# Patient Record
Sex: Male | Born: 1955 | Race: White | Hispanic: No | Marital: Married | Smoking: Current every day smoker
Health system: Southern US, Community
[De-identification: ages and names within clinical notes are randomized; demographics above are authoritative.]

## PROBLEM LIST (undated history)

## (undated) DIAGNOSIS — N189 Chronic kidney disease, unspecified: Secondary | ICD-10-CM

## (undated) DIAGNOSIS — I209 Angina pectoris, unspecified: Secondary | ICD-10-CM

## (undated) DIAGNOSIS — Z951 Presence of aortocoronary bypass graft: Secondary | ICD-10-CM

## (undated) DIAGNOSIS — E785 Hyperlipidemia, unspecified: Secondary | ICD-10-CM

## (undated) DIAGNOSIS — E119 Type 2 diabetes mellitus without complications: Secondary | ICD-10-CM

## (undated) DIAGNOSIS — J45909 Unspecified asthma, uncomplicated: Secondary | ICD-10-CM

## (undated) DIAGNOSIS — I219 Acute myocardial infarction, unspecified: Secondary | ICD-10-CM

## (undated) DIAGNOSIS — Z9889 Other specified postprocedural states: Secondary | ICD-10-CM

## (undated) DIAGNOSIS — I509 Heart failure, unspecified: Secondary | ICD-10-CM

## (undated) DIAGNOSIS — I2510 Atherosclerotic heart disease of native coronary artery without angina pectoris: Secondary | ICD-10-CM

## (undated) DIAGNOSIS — I10 Essential (primary) hypertension: Secondary | ICD-10-CM

## (undated) DIAGNOSIS — G709 Myoneural disorder, unspecified: Secondary | ICD-10-CM

## (undated) HISTORY — DX: Hyperlipidemia, unspecified: E78.5

## (undated) HISTORY — DX: Acute myocardial infarction, unspecified: I21.9

## (undated) HISTORY — PX: EYE SURGERY: SHX253

## (undated) HISTORY — DX: Heart failure, unspecified: I50.9

## (undated) HISTORY — DX: Type 2 diabetes mellitus without complications: E11.9

## (undated) HISTORY — DX: Essential (primary) hypertension: I10

## (undated) HISTORY — DX: Unspecified asthma, uncomplicated: J45.909

---

## 2000-11-05 HISTORY — PX: CAROTID ENDARTERECTOMY: SUR193

## 2000-11-05 HISTORY — PX: CORONARY ARTERY BYPASS GRAFT: SHX141

## 2001-04-20 ENCOUNTER — Inpatient Hospital Stay (HOSPITAL_COMMUNITY): Admission: AD | Admit: 2001-04-20 | Discharge: 2001-04-29 | Payer: Self-pay | Admitting: Cardiovascular Disease

## 2001-04-23 ENCOUNTER — Encounter: Payer: Self-pay | Admitting: Thoracic Surgery (Cardiothoracic Vascular Surgery)

## 2001-04-24 ENCOUNTER — Encounter: Payer: Self-pay | Admitting: Thoracic Surgery (Cardiothoracic Vascular Surgery)

## 2001-04-24 DIAGNOSIS — Z9889 Other specified postprocedural states: Secondary | ICD-10-CM

## 2001-04-24 DIAGNOSIS — Z951 Presence of aortocoronary bypass graft: Secondary | ICD-10-CM

## 2001-04-24 HISTORY — DX: Presence of aortocoronary bypass graft: Z95.1

## 2001-04-24 HISTORY — DX: Other specified postprocedural states: Z98.890

## 2001-04-25 ENCOUNTER — Encounter: Payer: Self-pay | Admitting: Thoracic Surgery (Cardiothoracic Vascular Surgery)

## 2001-04-25 ENCOUNTER — Encounter: Payer: Self-pay | Admitting: *Deleted

## 2001-04-26 ENCOUNTER — Encounter: Payer: Self-pay | Admitting: Thoracic Surgery (Cardiothoracic Vascular Surgery)

## 2001-04-27 ENCOUNTER — Encounter: Payer: Self-pay | Admitting: Thoracic Surgery (Cardiothoracic Vascular Surgery)

## 2012-09-12 ENCOUNTER — Encounter: Payer: Self-pay | Admitting: Physical Medicine and Rehabilitation

## 2012-10-01 ENCOUNTER — Encounter: Payer: Self-pay | Admitting: Physical Medicine and Rehabilitation

## 2012-10-01 ENCOUNTER — Encounter
Payer: Medicare Other | Attending: Physical Medicine and Rehabilitation | Admitting: Physical Medicine and Rehabilitation

## 2012-10-01 VITALS — BP 175/85 | HR 77 | Resp 14 | Ht 67.0 in | Wt 373.0 lb

## 2012-10-01 DIAGNOSIS — I739 Peripheral vascular disease, unspecified: Secondary | ICD-10-CM

## 2012-10-01 DIAGNOSIS — F17200 Nicotine dependence, unspecified, uncomplicated: Secondary | ICD-10-CM | POA: Insufficient documentation

## 2012-10-01 DIAGNOSIS — I252 Old myocardial infarction: Secondary | ICD-10-CM | POA: Insufficient documentation

## 2012-10-01 DIAGNOSIS — M545 Low back pain, unspecified: Secondary | ICD-10-CM

## 2012-10-01 DIAGNOSIS — G8929 Other chronic pain: Secondary | ICD-10-CM | POA: Insufficient documentation

## 2012-10-01 DIAGNOSIS — I10 Essential (primary) hypertension: Secondary | ICD-10-CM | POA: Insufficient documentation

## 2012-10-01 DIAGNOSIS — E119 Type 2 diabetes mellitus without complications: Secondary | ICD-10-CM | POA: Insufficient documentation

## 2012-10-01 DIAGNOSIS — M79609 Pain in unspecified limb: Secondary | ICD-10-CM

## 2012-10-01 DIAGNOSIS — E785 Hyperlipidemia, unspecified: Secondary | ICD-10-CM | POA: Insufficient documentation

## 2012-10-01 DIAGNOSIS — Z6841 Body Mass Index (BMI) 40.0 and over, adult: Secondary | ICD-10-CM | POA: Insufficient documentation

## 2012-10-01 DIAGNOSIS — Z5181 Encounter for therapeutic drug level monitoring: Secondary | ICD-10-CM

## 2012-10-01 DIAGNOSIS — M79604 Pain in right leg: Secondary | ICD-10-CM | POA: Insufficient documentation

## 2012-10-01 DIAGNOSIS — Z951 Presence of aortocoronary bypass graft: Secondary | ICD-10-CM | POA: Insufficient documentation

## 2012-10-01 MED ORDER — GABAPENTIN 400 MG PO CAPS: 300.0000 mg | ORAL_CAPSULE | Freq: Four times a day (QID) | ORAL | Status: DC

## 2012-10-01 NOTE — Patient Instructions (Signed)
Today we have discussed possible causes for your leg pain which is worse when you are up walking.  I have ordered some x-rays of your back to help me to evaluate this.  I am increasing your gabapentin from 300 mg 4 times a day to 400 mg 4 times a day.  I will see you back in 2-3 weeks.

## 2012-10-01 NOTE — Progress Notes (Signed)
Subjective:    Patient ID: Timothy Banks, male    DOB: 25-Nov-1955, 56 y.o.   MRN: [id number]  HPI  The patient is a 56 year old morbidly obese gentleman who has approximately 15 year history of diabetes. Underwent cardiac bypass surgery 2003.  Wife is in the room with the patient's permission.  Patient is referred for chronic pain complaints related to both feet and leg. Patient reports pain in feet and lower legs for approximately 2 years gradual onset. Denies trauma. Reports previous nerve conduction studies states he was told he had neuropathy. Studies are not available for review.  Patient has been tried on hydrocodone, oxycodone, gabapentin, Lyrica. He states none of them work very well.  He reports his average pain as an 8 or 9 on a scale of 10. On further questioning however he states that when he is sitting in his easy chair his pain is about a 2 or 3 on a scale of 10. His pain really only gets up to high levels when he is up walking or standing. In fact he states that he really can only walk a few minutes at a time before his back and his lower legs and feet hurt so much that he needs to sit down.  He spends most of his day sitting and watching television. He naps on and off throughout the day and night. He is very sedentary.  He is independent with self-care.  He drives very little. He's been disabled since approximately 2002 or 2003  Report some bladder problems, denies bowel control problems admit to numbness weakness tingling trouble walking  Denies suicidal ideation.  Other complaints include limb swelling, coughing, shortness of breath, wheezing  Patient admits to smoking half a pack per day cigarettes, advised against.  Past medical history is remarkable for diabetes coronary artery disease hypertension,  Surgical history heart surgery 2003, right carotid artery.  Pain Inventory Average Pain 8 Pain Right Now 9 My pain is burning and tingling  In the last 24 hours,  has pain interfered with the following? General activity 9 Relation with others 0 Enjoyment of life 9 What TIME of day is your pain at its worst? night Sleep (in general) Poor  Pain is worse with: walking and standing Pain improves with: medication Relief from Meds: 3  Mobility walk without assistance how many minutes can you walk? 2-3 ability to climb steps?  no do you drive?  yes  Function disabled: date disabled 2002-3 I need assistance with the following:  meal prep, household duties and shopping  Neuro/Psych bladder control problems weakness numbness tingling trouble walking  Prior Studies Any changes since last visit?  no  Physicians involved in your care Any changes since last visit?  no   Family History  Problem Relation Age of Onset  . Diabetes Mother   . Heart disease Mother   . Diabetes Father   . Heart disease Father    History   Social History  . Marital Status: Married    Spouse Name: N/A    Number of Children: N/A  . Years of Education: N/A   Social History Main Topics  . Smoking status: Current Every Day Smoker -- 1.0 packs/day    Types: Cigarettes    Start date: 11/05/1973  . Smokeless tobacco: Never Used     Comment: quit for 10 years, started back 9 mo ago  . Alcohol Use: None  . Drug Use: None  . Sexually Active: None   Other Topics  Concern  . None   Social History Narrative  . None   Past Surgical History  Procedure Date  . Endarterectomy 2002    right side  . Coronary artery bypass graft 2002   Past Medical History  Diagnosis Date  . Myocardial infarction   . Diabetes mellitus without complication   . CHF (congestive heart failure)   . Asthma   . Hyperlipidemia   . Hypertension    BP 175/85  Pulse 77  Resp 14  Ht 5\' 7"  (1.702 m)  Wt 373 lb (169.192 kg)  BMI 58.42 kg/m2  SpO2 93%    Review of Systems  Constitutional: Positive for diaphoresis and unexpected weight change.  Respiratory: Positive for cough,  shortness of breath and wheezing.   Cardiovascular: Positive for leg swelling.  Gastrointestinal: Positive for diarrhea.  Genitourinary:       Bladder control  Neurological: Positive for weakness and numbness.       Tingling  Hematological: Bruises/bleeds easily.  All other systems reviewed and are negative.       Objective:   Physical Exam  Morbidly obese gentleman who does not appear in any distress.  Oriented x3 each is clear affect is alert cooperative pleasant follows commands  Cranial nerves grossly intact  Coordination grossly intact  Focus on lower extremity exam.  Decreased sensation to pinprick light touch below knees  Motor strength good throughout both lower extremities except right EHL slightly weaker.  Reflexes are diminished and patellar tendons bilaterally Achilles tendons bilaterally  No abnormal tone clonus or tremors noted, toes downgoing  Transitions slowly sit to stand, gait is slightly wide based stable  Able to walk on heels and toes  Tandem gait with some difficulty  Romberg negative  Range of motion at hips knees and ankles within normal limits  Edema noted in both lower extremities.  Feet are warm without open areas difficult palpating pulses       Assessment & Plan:  1. 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 There may be an overlay of either lumbar spinal stenosis versus peripheral vascular disease. Will will review previous vascular workup and obtain lumbar radiographs.  Will increase  gabapentin from 300 mg 4 times a day to 400 mg 4 times a day.  UDS pending  Recommended assistive device, patient declines, states he is obtaining an electric scooter through other Dr.  Stann Mainland followup in 2-3 weeks after radiographs completed.   Addendum: Note for Dr. Ralene Cork  07/30/2012 note; "some vascular disease although not significant" "ABI's are normal and demonstrated a mildly abnormal wave forms and adequate perfusion for healing. Bilateral  anterior to demonstrate area of exclusive disease with diminished below and velocities on dorsalis pedis bilaterally being diminished. Peroneal arteries not visualized"

## 2012-10-01 NOTE — Addendum Note (Signed)
Addended by: Claiborne Rigg D on: 10/01/2012 01:33 PM   Modules accepted: Orders

## 2012-10-24 ENCOUNTER — Encounter
Payer: Medicare Other | Attending: Physical Medicine and Rehabilitation | Admitting: Physical Medicine and Rehabilitation

## 2012-10-24 DIAGNOSIS — E119 Type 2 diabetes mellitus without complications: Secondary | ICD-10-CM | POA: Insufficient documentation

## 2012-10-24 DIAGNOSIS — M79609 Pain in unspecified limb: Secondary | ICD-10-CM | POA: Insufficient documentation

## 2012-10-24 DIAGNOSIS — Z951 Presence of aortocoronary bypass graft: Secondary | ICD-10-CM | POA: Insufficient documentation

## 2012-10-24 DIAGNOSIS — G8929 Other chronic pain: Secondary | ICD-10-CM | POA: Insufficient documentation

## 2012-10-24 DIAGNOSIS — I10 Essential (primary) hypertension: Secondary | ICD-10-CM | POA: Insufficient documentation

## 2012-10-24 DIAGNOSIS — E785 Hyperlipidemia, unspecified: Secondary | ICD-10-CM | POA: Insufficient documentation

## 2012-10-24 DIAGNOSIS — Z6841 Body Mass Index (BMI) 40.0 and over, adult: Secondary | ICD-10-CM | POA: Insufficient documentation

## 2012-10-24 DIAGNOSIS — I252 Old myocardial infarction: Secondary | ICD-10-CM | POA: Insufficient documentation

## 2012-10-24 DIAGNOSIS — F17200 Nicotine dependence, unspecified, uncomplicated: Secondary | ICD-10-CM | POA: Insufficient documentation

## 2012-10-24 NOTE — Progress Notes (Unsigned)
No show for appt. 

## 2013-02-17 ENCOUNTER — Inpatient Hospital Stay (HOSPITAL_COMMUNITY): Payer: Medicare Other

## 2013-02-17 ENCOUNTER — Encounter (HOSPITAL_COMMUNITY): Payer: Self-pay | Admitting: General Practice

## 2013-02-17 ENCOUNTER — Inpatient Hospital Stay (HOSPITAL_COMMUNITY)
Admission: AD | Admit: 2013-02-17 | Discharge: 2013-02-23 | DRG: 682 | Disposition: A | Discharge status: Home / Self Care | Payer: Medicare Other | Source: Other Acute Inpatient Hospital | Attending: Family Medicine | Admitting: Family Medicine

## 2013-02-17 DIAGNOSIS — Z6841 Body Mass Index (BMI) 40.0 and over, adult: Secondary | ICD-10-CM

## 2013-02-17 DIAGNOSIS — N189 Chronic kidney disease, unspecified: Secondary | ICD-10-CM

## 2013-02-17 DIAGNOSIS — E662 Morbid (severe) obesity with alveolar hypoventilation: Secondary | ICD-10-CM | POA: Diagnosis present

## 2013-02-17 DIAGNOSIS — G4733 Obstructive sleep apnea (adult) (pediatric): Secondary | ICD-10-CM | POA: Diagnosis present

## 2013-02-17 DIAGNOSIS — N2581 Secondary hyperparathyroidism of renal origin: Secondary | ICD-10-CM | POA: Diagnosis present

## 2013-02-17 DIAGNOSIS — M545 Low back pain: Secondary | ICD-10-CM

## 2013-02-17 DIAGNOSIS — I120 Hypertensive chronic kidney disease with stage 5 chronic kidney disease or end stage renal disease: Secondary | ICD-10-CM | POA: Diagnosis present

## 2013-02-17 DIAGNOSIS — N170 Acute kidney failure with tubular necrosis: Principal | ICD-10-CM | POA: Diagnosis present

## 2013-02-17 DIAGNOSIS — E872 Acidosis, unspecified: Secondary | ICD-10-CM | POA: Diagnosis present

## 2013-02-17 DIAGNOSIS — F17200 Nicotine dependence, unspecified, uncomplicated: Secondary | ICD-10-CM | POA: Diagnosis present

## 2013-02-17 DIAGNOSIS — E118 Type 2 diabetes mellitus with unspecified complications: Secondary | ICD-10-CM

## 2013-02-17 DIAGNOSIS — E873 Alkalosis: Secondary | ICD-10-CM | POA: Diagnosis not present

## 2013-02-17 DIAGNOSIS — Z951 Presence of aortocoronary bypass graft: Secondary | ICD-10-CM

## 2013-02-17 DIAGNOSIS — E1129 Type 2 diabetes mellitus with other diabetic kidney complication: Secondary | ICD-10-CM | POA: Diagnosis present

## 2013-02-17 DIAGNOSIS — I509 Heart failure, unspecified: Secondary | ICD-10-CM | POA: Diagnosis present

## 2013-02-17 DIAGNOSIS — Z79899 Other long term (current) drug therapy: Secondary | ICD-10-CM

## 2013-02-17 DIAGNOSIS — R319 Hematuria, unspecified: Secondary | ICD-10-CM | POA: Diagnosis not present

## 2013-02-17 DIAGNOSIS — M79604 Pain in right leg: Secondary | ICD-10-CM

## 2013-02-17 DIAGNOSIS — J9600 Acute respiratory failure, unspecified whether with hypoxia or hypercapnia: Secondary | ICD-10-CM

## 2013-02-17 DIAGNOSIS — Z7902 Long term (current) use of antithrombotics/antiplatelets: Secondary | ICD-10-CM

## 2013-02-17 DIAGNOSIS — J9601 Acute respiratory failure with hypoxia: Secondary | ICD-10-CM | POA: Diagnosis present

## 2013-02-17 DIAGNOSIS — I2510 Atherosclerotic heart disease of native coronary artery without angina pectoris: Secondary | ICD-10-CM | POA: Diagnosis present

## 2013-02-17 DIAGNOSIS — N186 End stage renal disease: Secondary | ICD-10-CM | POA: Diagnosis present

## 2013-02-17 DIAGNOSIS — E785 Hyperlipidemia, unspecified: Secondary | ICD-10-CM | POA: Diagnosis present

## 2013-02-17 DIAGNOSIS — N179 Acute kidney failure, unspecified: Secondary | ICD-10-CM | POA: Diagnosis present

## 2013-02-17 DIAGNOSIS — J9620 Acute and chronic respiratory failure, unspecified whether with hypoxia or hypercapnia: Secondary | ICD-10-CM | POA: Diagnosis present

## 2013-02-17 DIAGNOSIS — E8770 Fluid overload, unspecified: Secondary | ICD-10-CM

## 2013-02-17 DIAGNOSIS — E8779 Other fluid overload: Secondary | ICD-10-CM | POA: Diagnosis present

## 2013-02-17 DIAGNOSIS — N039 Chronic nephritic syndrome with unspecified morphologic changes: Secondary | ICD-10-CM | POA: Diagnosis present

## 2013-02-17 DIAGNOSIS — E876 Hypokalemia: Secondary | ICD-10-CM | POA: Diagnosis not present

## 2013-02-17 DIAGNOSIS — D631 Anemia in chronic kidney disease: Secondary | ICD-10-CM | POA: Diagnosis present

## 2013-02-17 DIAGNOSIS — I252 Old myocardial infarction: Secondary | ICD-10-CM

## 2013-02-17 HISTORY — DX: Chronic kidney disease, unspecified: N18.9

## 2013-02-17 LAB — CBC WITH DIFFERENTIAL/PLATELET
Basophils Absolute: 0 10*3/uL (ref 0.0–0.1)
Basophils Relative: 0 % (ref 0–1)
Eosinophils Absolute: 0.4 10*3/uL (ref 0.0–0.7)
Eosinophils Relative: 3 % (ref 0–5)
HCT: 30.3 % — ABNORMAL LOW (ref 39.0–52.0)
MCH: 24.7 pg — ABNORMAL LOW (ref 26.0–34.0)
MCHC: 30.4 g/dL (ref 30.0–36.0)
MCV: 81.2 fL (ref 78.0–100.0)
Monocytes Absolute: 0.9 10*3/uL (ref 0.1–1.0)
Platelets: 202 10*3/uL (ref 150–400)
RDW: 18.1 % — ABNORMAL HIGH (ref 11.5–15.5)
WBC: 11.1 10*3/uL — ABNORMAL HIGH (ref 4.0–10.5)

## 2013-02-17 LAB — ABO/RH: ABO/RH(D): A POS

## 2013-02-17 LAB — GLUCOSE, CAPILLARY
Glucose-Capillary: 86 mg/dL (ref 70–99)
Glucose-Capillary: 89 mg/dL (ref 70–99)
Glucose-Capillary: 112 mg/dL — ABNORMAL HIGH (ref 70–99)
Glucose-Capillary: 130 mg/dL — ABNORMAL HIGH (ref 70–99)

## 2013-02-17 LAB — URINALYSIS, ROUTINE W REFLEX MICROSCOPIC
Glucose, UA: NEGATIVE mg/dL
Protein, ur: 100 mg/dL — AB
Specific Gravity, Urine: 1.012 (ref 1.005–1.030)
Urobilinogen, UA: 0.2 mg/dL (ref 0.0–1.0)

## 2013-02-17 LAB — COMPREHENSIVE METABOLIC PANEL
ALT: 11 U/L (ref 0–53)
AST: 11 U/L (ref 0–37)
CO2: 28 mEq/L (ref 19–32)
Calcium: 8.7 mg/dL (ref 8.4–10.5)
Chloride: 96 mEq/L (ref 96–112)
Creatinine, Ser: 4.37 mg/dL — ABNORMAL HIGH (ref 0.50–1.35)
GFR calc Af Amer: 16 mL/min — ABNORMAL LOW (ref >90)
GFR calc non Af Amer: 14 mL/min — ABNORMAL LOW (ref >90)
Glucose, Bld: 77 mg/dL (ref 70–99)
Sodium: 136 mEq/L (ref 135–145)
Total Bilirubin: 0.4 mg/dL (ref 0.3–1.2)

## 2013-02-17 LAB — URINE MICROSCOPIC-ADD ON

## 2013-02-17 LAB — TYPE AND SCREEN: ABO/RH(D): A POS

## 2013-02-17 LAB — MRSA PCR SCREENING: MRSA by PCR: POSITIVE — AB

## 2013-02-17 MED ORDER — CHLORTHALIDONE 50 MG PO TABS
500.0000 mg | ORAL_TABLET | Freq: Two times a day (BID) | ORAL | Status: DC
  Filled 2013-02-17: qty 10

## 2013-02-17 MED ORDER — SODIUM CHLORIDE 0.9 % IV SOLN
250.0000 mL | INTRAVENOUS | Status: DC | PRN
  Administered 2013-02-18: 250 mL via INTRAVENOUS

## 2013-02-17 MED ORDER — HYDRALAZINE HCL 50 MG PO TABS
50.0000 mg | ORAL_TABLET | Freq: Four times a day (QID) | ORAL | Status: DC
  Administered 2013-02-17 – 2013-02-23 (×23): 50 mg via ORAL
  Filled 2013-02-17 (×28): qty 1

## 2013-02-17 MED ORDER — INSULIN ASPART 100 UNIT/ML ~~LOC~~ SOLN
0.0000 [IU] | SUBCUTANEOUS | Status: DC
  Administered 2013-02-17 – 2013-02-18 (×2): 3 [IU] via SUBCUTANEOUS

## 2013-02-17 MED ORDER — ALBUTEROL SULFATE HFA 108 (90 BASE) MCG/ACT IN AERS: 2.0000 | INHALATION_SPRAY | Freq: Four times a day (QID) | RESPIRATORY_TRACT | Status: DC | PRN

## 2013-02-17 MED ORDER — FUROSEMIDE 10 MG/ML IJ SOLN
200.0000 mg | Freq: Four times a day (QID) | INTRAVENOUS | Status: DC
  Administered 2013-02-17: 200 mg via INTRAVENOUS
  Filled 2013-02-17 (×3): qty 20

## 2013-02-17 MED ORDER — CHLORHEXIDINE GLUCONATE CLOTH 2 % EX PADS
6.0000 | MEDICATED_PAD | Freq: Every day | CUTANEOUS | Status: AC
  Administered 2013-02-18 – 2013-02-22 (×5): 6 via TOPICAL

## 2013-02-17 MED ORDER — HEPARIN SODIUM (PORCINE) 5000 UNIT/ML IJ SOLN
5000.0000 [IU] | Freq: Three times a day (TID) | INTRAMUSCULAR | Status: DC
  Administered 2013-02-17 – 2013-02-19 (×8): 5000 [IU] via SUBCUTANEOUS
  Filled 2013-02-17 (×12): qty 1

## 2013-02-17 MED ORDER — KIDNEY FAILURE BOOK
Freq: Once | Status: AC
  Administered 2013-02-17: 1
  Filled 2013-02-17: qty 1

## 2013-02-17 MED ORDER — FUROSEMIDE 10 MG/ML IJ SOLN
200.0000 mg | Freq: Four times a day (QID) | INTRAVENOUS | Status: DC
  Administered 2013-02-18 – 2013-02-19 (×6): 200 mg via INTRAVENOUS
  Filled 2013-02-17 (×9): qty 20

## 2013-02-17 MED ORDER — SIMVASTATIN 20 MG PO TABS
20.0000 mg | ORAL_TABLET | Freq: Every evening | ORAL | Status: DC
  Administered 2013-02-17 – 2013-02-22 (×6): 20 mg via ORAL
  Filled 2013-02-17 (×7): qty 1

## 2013-02-17 MED ORDER — METOPROLOL TARTRATE 50 MG PO TABS
50.0000 mg | ORAL_TABLET | Freq: Two times a day (BID) | ORAL | Status: DC
  Administered 2013-02-17 – 2013-02-23 (×12): 50 mg via ORAL
  Filled 2013-02-17 (×14): qty 1

## 2013-02-17 MED ORDER — ISOSORBIDE MONONITRATE 10 MG PO TABS
10.0000 mg | ORAL_TABLET | Freq: Two times a day (BID) | ORAL | Status: DC
  Administered 2013-02-17 – 2013-02-23 (×12): 10 mg via ORAL
  Filled 2013-02-17 (×17): qty 1

## 2013-02-17 MED ORDER — CALCIUM ACETATE 667 MG PO CAPS
667.0000 mg | ORAL_CAPSULE | Freq: Three times a day (TID) | ORAL | Status: DC
  Administered 2013-02-17 – 2013-02-23 (×18): 667 mg via ORAL
  Filled 2013-02-17 (×20): qty 1

## 2013-02-17 MED ORDER — INSULIN GLARGINE 100 UNIT/ML ~~LOC~~ SOLN
15.0000 [IU] | Freq: Every day | SUBCUTANEOUS | Status: DC
  Administered 2013-02-17 – 2013-02-19 (×3): 15 [IU] via SUBCUTANEOUS
  Filled 2013-02-17 (×5): qty 0.15

## 2013-02-17 MED ORDER — HEPARIN SODIUM (PORCINE) 1000 UNIT/ML IJ SOLN
1.2000 mL | INTRAMUSCULAR | Status: DC | PRN
  Administered 2013-02-17: 2400 [IU] via INTRAVENOUS

## 2013-02-17 MED ORDER — ACETAMINOPHEN 325 MG PO TABS
650.0000 mg | ORAL_TABLET | Freq: Four times a day (QID) | ORAL | Status: DC | PRN
Start: 2013-02-17 — End: 2013-02-23
  Administered 2013-02-17 – 2013-02-22 (×3): 650 mg via ORAL
  Filled 2013-02-17 (×3): qty 2

## 2013-02-17 MED ORDER — CHLORTHALIDONE 50 MG PO TABS: 500.0000 mg | ORAL_TABLET | Freq: Two times a day (BID) | ORAL | Status: DC

## 2013-02-17 MED ORDER — HEPARIN SODIUM (PORCINE) 1000 UNIT/ML IJ SOLN
1.2000 mL | INTRAMUSCULAR | Status: DC | PRN
  Filled 2013-02-17: qty 2.4

## 2013-02-17 MED ORDER — CHLORTHALIDONE 50 MG PO TABS
200.0000 mg | ORAL_TABLET | Freq: Two times a day (BID) | ORAL | Status: AC
  Administered 2013-02-17 – 2013-02-18 (×2): 200 mg via ORAL
  Filled 2013-02-17 (×4): qty 4

## 2013-02-17 MED ORDER — MUPIROCIN 2 % EX OINT
1.0000 "application " | TOPICAL_OINTMENT | Freq: Two times a day (BID) | CUTANEOUS | Status: AC
  Administered 2013-02-17 – 2013-02-22 (×9): 1 via NASAL
  Filled 2013-02-17 (×2): qty 22

## 2013-02-17 NOTE — Procedures (Signed)
Hemodialysis Insertion Procedure Note Timothy Banks [id number] 1955-11-14  Procedure: Insertion of Hemodialysis Catheter Type: 3 port  Indications: Hemodialysis   Procedure Details Consent: Risks of procedure as well as the alternatives and risks of each were explained to the (patient/caregiver).  Consent for procedure obtained. Time Out: Verified patient identification, verified procedure, site/side was marked, verified correct patient position, special equipment/implants available, medications/allergies/relevent history reviewed, required imaging and test results available.  Performed  Maximum sterile technique was used including antiseptics, cap, gloves, gown, hand hygiene, mask and sheet. Skin prep: Chlorhexidine; local anesthetic administered A antimicrobial bonded/coated triple lumen catheter was placed in the right internal jugular vein using the Seldinger technique. Ultrasound guidance used.yes Catheter placed to 16 cm. Blood aspirated via all 3 ports and then flushed x 3. Line sutured x 2 and dressing applied.  Evaluation Blood flow good Complications: No apparent complications Patient did tolerate procedure well. Chest X-ray ordered to verify placement.  CXR: pending.  Brett Canales Minor ACNP [facility] PCCM Pager [phone] till 3 pm If no answer page [phone]  Ultrasound used for site verification, live visualisation of needle entry & guidewire prior to dilation Timothy Michel V.   02/17/2013, 12:22 PM

## 2013-02-17 NOTE — Progress Notes (Addendum)
RT attempted bipap with pt. As per order. Pt. Did not tolerate it. Pt. Ripped off the mask and stated he could not handle it. Pt. States he felt claustrophobic. RT placed pt. Back on nasal cannula 6L. RT will inform RN.

## 2013-02-17 NOTE — Progress Notes (Signed)
Placed pt. On venturi mask 50% due to pt. desating below 86 on 6L nasal cannula.

## 2013-02-17 NOTE — Consult Note (Signed)
I was asked by Dr. Vassie Loll to see Timothy Banks who is a 57 y.o. male transferred from [facility] with renal failure.  According to outside notes patient has known CKD stage 4 when admitted to [facility] in Feb 2014 with CHF and left AMA.  Pt admitted 4/11 to [facility] with c/o increasing swelling and SOB , felt to have decompensated CHF.  He has been relatively resistant to diuresis at [facility]. Prior to admission he ate whatever he wanted to eat.  Past Medical History  Diagnosis Date  . Myocardial infarction   . Diabetes mellitus without complication   . CHF (congestive heart failure)   . Asthma   . Hyperlipidemia   . Hypertension    Past Surgical History  Procedure Laterality Date  . Endarterectomy  2002    right side  . Coronary artery bypass graft  2002   Social History:  reports that he has been smoking Cigarettes.  He started smoking about 39 years ago. He has been smoking about 1.00 pack per day. He has never used smokeless tobacco. His alcohol and drug histories are not on file. Allergies: No Known Allergies Family History  Problem Relation Age of Onset  . Diabetes Mother   . Heart disease Mother   . Diabetes Father   . Heart disease Father     Medications:  Scheduled: . furosemide  200 mg Intravenous Q6H  . heparin  5,000 Units Subcutaneous Q8H  . hydrALAZINE  50 mg Oral Q6H  . insulin aspart  0-20 Units Subcutaneous Q4H  . insulin glargine  15 Units Subcutaneous QHS  . isosorbide mononitrate  10 mg Oral BID  . metoprolol  50 mg Oral BID  . simvastatin  20 mg Oral QPM    ROS: as per HPI  Blood pressure 148/54, pulse 62, temperature 97.5 F (36.4 C), temperature source Oral, resp. rate 19, height 5\' 7"  (1.702 m), weight 180.078 kg (397 lb), SpO2 96.00%.  General appearance: alert and cooperative Head: Normocephalic, without obvious abnormality, atraumatic Eyes: negative  Neck with right IJ catheter Nose: Nares normal. Septum midline. Mucosa normal. No drainage or sinus  tenderness. Throat: lips, mucosa, and tongue normal; teeth and gums normal GI: soft, non-tender; bowel sounds normal; no masses,  no organomegaly and mild ruq tenderness Extremities: edema 2-3+ edema Skin: Skin color, texture, turgor normal. No rashes or lesions Neurologic: Grossly normal Results for orders placed during the hospital encounter of 02/17/13 (from the past 48 hour(s))  GLUCOSE, CAPILLARY     Status: None   Collection Time    02/17/13 10:16 AM      Result Value Range   Glucose-Capillary 86  70 - 99 mg/dL  URINALYSIS, ROUTINE W REFLEX MICROSCOPIC     Status: Abnormal   Collection Time    02/17/13 10:22 AM      Result Value Range   Color, Urine YELLOW  YELLOW   APPearance CLOUDY (*) CLEAR   Specific Gravity, Urine 1.012  1.005 - 1.030   pH 5.0  5.0 - 8.0   Glucose, UA NEGATIVE  NEGATIVE mg/dL   Hgb urine dipstick LARGE (*) NEGATIVE   Bilirubin Urine NEGATIVE  NEGATIVE   Ketones, ur NEGATIVE  NEGATIVE mg/dL   Protein, ur 284 (*) NEGATIVE mg/dL   Urobilinogen, UA 0.2  0.0 - 1.0 mg/dL   Nitrite NEGATIVE  NEGATIVE   Leukocytes, UA MODERATE (*) NEGATIVE  URINE MICROSCOPIC-ADD ON     Status: None   Collection Time  02/17/13 10:22 AM      Result Value Range   Squamous Epithelial / LPF RARE  RARE   Comment: RARE   WBC, UA 21-50  <3 WBC/hpf   RBC / HPF 21-50  <3 RBC/hpf   Bacteria, UA RARE  RARE   Urine-Other RARE YEAST     Comment: LESS THAN 10 mL OF URINE SUBMITTED  MRSA PCR SCREENING     Status: Abnormal   Collection Time    02/17/13 10:23 AM      Result Value Range   MRSA by PCR POSITIVE (*) NEGATIVE   Comment:            The GeneXpert MRSA Assay (FDA     approved for NASAL specimens     only), is one component of a     comprehensive MRSA colonization     surveillance program. It is not     intended to diagnose MRSA     infection nor to guide or     monitor treatment for     MRSA infections.     RESULT CALLED TO, READ BACK BY AND VERIFIED WITH:     C.  MCKEOWN RN 12:40 02/17/13 ([email or web address])  COMPREHENSIVE METABOLIC PANEL     Status: Abnormal   Collection Time    02/17/13 11:30 AM      Result Value Range   Sodium 136  135 - 145 mEq/L   Potassium 4.9  3.5 - 5.1 mEq/L   Chloride 96  96 - 112 mEq/L   CO2 28  19 - 32 mEq/L   Glucose, Bld 77  70 - 99 mg/dL   BUN 84 (*) 6 - 23 mg/dL   Creatinine, Ser 1.61 (*) 0.50 - 1.35 mg/dL   Calcium 8.7  8.4 - 09.6 mg/dL   Total Protein 7.6  6.0 - 8.3 g/dL   Albumin 3.2 (*) 3.5 - 5.2 g/dL   AST 11  0 - 37 U/L   ALT 11  0 - 53 U/L   Alkaline Phosphatase 74  39 - 117 U/L   Total Bilirubin 0.4  0.3 - 1.2 mg/dL   GFR calc non Af Amer 14 (*) >90 mL/min   GFR calc Af Amer 16 (*) >90 mL/min   Comment:            The eGFR has been calculated     using the CKD EPI equation.     This calculation has not been     validated in all clinical     situations.     eGFR's persistently     <90 mL/min signify     possible Chronic Kidney Disease.  MAGNESIUM     Status: None   Collection Time    02/17/13 11:30 AM      Result Value Range   Magnesium 2.5  1.5 - 2.5 mg/dL  PHOSPHORUS     Status: Abnormal   Collection Time    02/17/13 11:30 AM      Result Value Range   Phosphorus 7.4 (*) 2.3 - 4.6 mg/dL  CBC WITH DIFFERENTIAL     Status: Abnormal   Collection Time    02/17/13 11:30 AM      Result Value Range   WBC 11.1 (*) 4.0 - 10.5 K/uL   RBC 3.73 (*) 4.22 - 5.81 MIL/uL   Hemoglobin 9.2 (*) 13.0 - 17.0 g/dL   HCT 04.5 (*) 40.9 - 81.1 %   MCV 81.2  78.0 -  100.0 fL   MCH 24.7 (*) 26.0 - 34.0 pg   MCHC 30.4  30.0 - 36.0 g/dL   RDW 81.1 (*) 91.4 - 78.2 %   Platelets 202  150 - 400 K/uL   Neutrophils Relative 82 (*) 43 - 77 %   Neutro Abs 9.1 (*) 1.7 - 7.7 K/uL   Lymphocytes Relative 7 (*) 12 - 46 %   Lymphs Abs 0.7  0.7 - 4.0 K/uL   Monocytes Relative 8  3 - 12 %   Monocytes Absolute 0.9  0.1 - 1.0 K/uL   Eosinophils Relative 3  0 - 5 %   Eosinophils Absolute 0.4  0.0 - 0.7 K/uL   Basophils Relative 0  0  - 1 %   Basophils Absolute 0.0  0.0 - 0.1 K/uL  TYPE AND SCREEN     Status: None   Collection Time    02/17/13  1:10 PM      Result Value Range   ABO/RH(D) A POS     Antibody Screen NEG     Sample Expiration 02/20/2013     Dg Chest Port 1 View  02/17/2013  *RADIOLOGY REPORT*  Clinical Data: New right internal jugular hemodialysis catheter  PORTABLE CHEST - 1 VIEW  Comparison: Portable exam 1259 hours compared to 02/17/2013  Findings: New right IJ catheter tip projecting over SVC at the level of the aortic arch. Enlargement of cardiac silhouette post CABG. Pulmonary vascular congestion. Perihilar infiltrate likely mild edema. No gross pleural effusion or pneumothorax.  IMPRESSION: No pneumothorax following right jugular line placement. Probable mild pulmonary edema.   Original Report Authenticated By: Ulyses Southward, M.D.     Assessment: 1 Volume overload in setting of advanced CKD 2 Diabetes mellitus with complications 3 Morbid obesity with complications  Plan: 1 Trial of high dose diuretic therapy (Furosemide 200 mg IV Q6) 2 Add IV Chlorthalidone 500mg  3 Add phosphate binder 4 Dialysis education 5 Anticipate dialysis in near future; protect right arm from needle sticks. He is left handed.  Aneesah Hernan C 02/17/2013, 3:33 PM

## 2013-02-17 NOTE — H&P (Signed)
PULMONARY  / CRITICAL CARE MEDICINE  Name: Timothy Banks MRN: [id number] DOB: 1956/05/11    ADMISSION DATE:  02/17/2013 CONSULTATION DATE:  02/17/2013  REFERRING MD :  Dr Ricki Miller  CHIEF COMPLAINT:  Difficulty breathing, transfer for acute renal failure  BRIEF PATIENT DESCRIPTION: 57 yo morbidly obese male transferred 4/15 from [place] for acute renal failure. Admitted 4/11 with worsening shortness of breath that has greatly limited his mobility.  He has multiple medical problems including DM, CHF, prior MI, , CAD, and CRF.   Baseline cr 1.5 in 2011, 3.1 on adm - diuresed with lasix gtt, on 4/14 diuresed 1.1 L with lasix 160 & zaroxlyn 10  ABg s/o resp acidosis on 4/15 - 7.31 /56  SIGNIFICANT EVENTS / STUDIES:    LINES / TUBES:   CULTURES:   ANTIBIOTICS:   HISTORY OF PRESENT ILLNESS:   57 yo morbidly obese male transferred 4/15 from  for acute renal failure. Admitted 4/11 with worsening shortness of breath that has greatly limited his mobility.  The patient states that he has had difficulty breathing for the last few years.  He went for walks in the past but has not been able to in the past few months.  The patient feels weak when he tries to perform any activities.  He recalls that about 6 weeks ago he fell trying to get off the toilet in his home.  The patient remembers the fall, and says he was unable to get up afterwards.  He has multiple medical problems including DM, CHF, prior MI, , CAD, and CRF.  The patient complains of swelling in his legs and pain around his ankles.  He reports occasional chest pain that radiates across his chest.  He denies dizziness, headache, diaphoresis, nausea, or vomiting.      PAST MEDICAL HISTORY :  Past Medical History  Diagnosis Date  . Myocardial infarction   . Diabetes mellitus without complication   . CHF (congestive heart failure)   . Asthma   . Hyperlipidemia   . Hypertension    Past Surgical History  Procedure  Laterality Date  . Endarterectomy  2002    right side  . Coronary artery bypass graft  2002   Prior to Admission medications   Medication Sig Start Date End Date Taking? Authorizing Provider  albuterol (PROVENTIL HFA;VENTOLIN HFA) 108 (90 BASE) MCG/ACT inhaler Inhale 2 puffs into the lungs every 6 (six) hours as needed.    Eugenia Pancoast  clopidogrel (PLAVIX) 75 MG tablet Take 75 mg by mouth daily.    Eugenia Pancoast  gabapentin (NEURONTIN) 400 MG capsule Take 1 capsule (400 mg total) by mouth 4 (four) times daily. 10/01/12   Ashok Cordia, MD  glipiZIDE (GLUCOTROL) 10 MG tablet Take 10 mg by mouth 2 (two) times daily before a meal.    Eugenia Pancoast  HYDROcodone-acetaminophen (NORCO) 10-325 MG per tablet Take 1 tablet by mouth every 6 (six) hours as needed.    Eugenia Pancoast  ISOSORBIDE MONONITRATE ER PO Take 30 mg by mouth daily.    Eugenia Pancoast  losartan (COZAAR) 100 MG tablet Take 100 mg by mouth daily.    Eugenia Pancoast  metFORMIN (GLUCOPHAGE) 1000 MG tablet Take 1,000 mg by mouth 2 (two) times daily with a meal.    Eugenia Pancoast  metoprolol (LOPRESSOR) 50 MG tablet Take 75 mg by mouth 2 (two) times daily. One and one-half tablets bid    Clancy C.  Laizure  nitroGLYCERIN (NITROSTAT) 0.4 MG SL tablet Place 0.4 mg under the tongue every 5 (five) minutes as needed.    Eugenia Pancoast  oxyCODONE-acetaminophen (PERCOCET) 10-325 MG per tablet Take 1 tablet by mouth every 8 (eight) hours as needed.    Eugenia Pancoast  simvastatin (ZOCOR) 20 MG tablet Take 20 mg by mouth every evening.    Eugenia Pancoast  torsemide (DEMADEX) 20 MG tablet Take 20 mg by mouth daily.    Eugenia Pancoast   No Known Allergies  FAMILY HISTORY:  Family History  Problem Relation Age of Onset  . Diabetes Mother   . Heart disease Mother   . Diabetes Father   . Heart disease Father    SOCIAL HISTORY:  reports that he has been smoking Cigarettes.  He started smoking about 39 years  ago. He has been smoking about 1.00 pack per day. He has never used smokeless tobacco. His alcohol and drug histories are not on file.  REVIEW OF SYSTEMS:  Gen: Denies fever, chills, weight change, fatigue, night sweats HEENT: Denies blurred vision, double vision, hearing loss, tinnitus, sinus congestion, rhinorrhea, sore throat, neck stiffness, dysphagia PULM: Positive shortness of breath, occasional cough; Negative sputum production; Negative hemoptysis, wheezing CV: Positive occasional  chest pain, edema, orthopnea, paroxysmal nocturnal dyspnea; Negative palpitations GI: Denies abdominal pain, nausea, vomiting, diarrhea, hematochezia, melena, constipation, change in bowel habits GU: Denies dysuria, hematuria, polyuria, oliguria, urethral discharge Endocrine: Denies hot or cold intolerance, polyuria, polyphagia or appetite change Derm: Denies rash, dry skin, scaling or peeling skin change Heme: Denies easy bruising, bleeding, bleeding gums Neuro: Positive weakness; Denies headache, numbness, slurred speech, loss of memory or consciousness  SUBJECTIVE:   VITAL SIGNS: Temp:  [97.5 F (36.4 C)] 97.5 F (36.4 C) (04/15 1020) Weight:  [397 lb (180.078 kg)] 397 lb (180.078 kg) (04/15 1020) HEMODYNAMICS:   VENTILATOR SETTINGS:   INTAKE / OUTPUT: Intake/Output   None     PHYSICAL EXAMINATION: General:  Well developed, alert and oriented, obese male in NAD ,normal affect ENT - no lesions, no post nasal drip Neck: No JVD, no thyromegaly, no carotid bruits Lungs: no use of accessory muscles, no dullness to percussion, decreased  without rales or rhonchi  Cardiovascular: Rhythm regular, heart sounds  normal, no murmurs, 1+ peripheral edema Abdomen: soft and non-tender, no hepatosplenomegaly, BS normal. Musculoskeletal: No deformities, no cyanosis or clubbing Neuro:  alert, non focal, no asterexis Skin:  Warm, no lesions/ rash   LABS: No results found for this basename: HGB, WBC, PLT,  NA, K, CL, CO2, GLUCOSE, BUN, CREATININE, CALCIUM, MG, PHOS, AST, ALT, ALKPHOS, BILITOT, PROT, ALBUMIN, APTT, INR, LATICACIDVEN, TROPONINI, PROCALCITON, PROBNP, O2SATVEN, PHART, PCO2ART, PO2ART,  in the last 168 hours  Recent Labs Lab 02/17/13 1016  GLUCAP 86    CXR: pending EKG- nSR, first degree HB, poor R wave progression  ASSESSMENT / PLAN:  PULMONARY A:Acute hypercarbic respiratory failure Resp acidosis with inadequate compensation on ABG  Likely has underlying OSA/ OHS P:   Bipap qhs & prn if mental status worsens - will need out pt sleep study & CPAP on discharge Monitor for altered mental status  CARDIOVASCULAR A: CHF -nml EF 55-60% CAD P: Avoid ACE or ARB  Use hydralazine & nitrates Lower dose of lopressor 50 bid - with hold parameters - due to first degree heart block Hold plavix for now - can restart in 24h if no procedures required   RENAL A:  ARF -  presume ATN now, non oliguric with lasix Inadequate compensation for metab aidosis P:   D/C Losartan and Metformin Monitor for possible potassium related EKG changes Renal US Renal consult- called Defer diuretics & bicarb to Renal   GASTROINTESTINAL A:  No issues P:   Renal diet  HEMATOLOGIC A:  Anemia of CKD P:  monitor  INFECTIOUS A:  No issues   ENDOCRINE A:  DM Type II   P:  SSI Give 1/2 dose - lantus 15 for now Hold metformin   NEUROLOGIC A:  No encephalopathy, no asterexis P:  Monitor mental status - bipap if worse  TODAY'S SUMMARY:  Transferred for acute renal failure - hope to avoid HD here, but will go ahead & place for access & CVP monitoring - can move to SDU if remains stable Defer diuretics & bicarb to Renal   Anna Genre, PA-S  I have personally obtained a history, examined the patient, evaluated laboratory and imaging results, formulated the assessment and plan and placed orders. CRITICAL CARE: The patient is critically ill with multiple organ systems failure and requires  high complexity decision making for assessment and support, frequent evaluation and titration of therapies, application of advanced monitoring technologies and extensive interpretation of multiple databases. Critical Care Time devoted to patient care services described in this note is 50 minutes.   Oretha Milch  Pulmonary and Critical Care Medicine [facility] Pager: [phone]  02/17/2013, 10:48 AM

## 2013-02-17 NOTE — Clinical Social Work Psychosocial (Signed)
     Clinical Social Work Department BRIEF PSYCHOSOCIAL ASSESSMENT 02/17/2013  Patient:  TARRY, FOUNTAIN     Account Number:  [id number]     Admit date:  02/17/2013  Clinical Social Worker:  Margaree Mackintosh  Date/Time:  02/17/2013 11:26 AM  Referred by:  RN  Date Referred:  02/17/2013 Referred for  Other - See comment   Other Referral:   Family is lost in hospital.   Interview type:  Family Other interview type:    PSYCHOSOCIAL DATA Living Status:  FAMILY Admitted from facility:   Level of care:   Primary support name:  Elis Sauber: [phone] Primary support relationship to patient:  SPOUSE Degree of support available:   Adequate.    CURRENT CONCERNS Current Concerns  Other - See comment   Other Concerns:   Family lost in hospital.    SOCIAL WORK ASSESSMENT / PLAN Clinical Social Worker received referral from RN indicating pt's son  is attempting to locate pt's wife wife, who is legally blind.  Both have become separated from each other in the hospital.  CSW located pt's son on second floor attempting to find pt's wife.  CSW walked with pt and notified security.  CSW and son located pt's wife, with pt's sister, in the ED.  Wife shared that she left her phone in son's car and was unable to call.  CSW notified security that all family members have been located.  CSW escorted family to pt's room, son retrieved phone from his car to assist with locating each other, should they become separated again.  CSW reviewed how to ask for help in the hospital-should they become lost again.  CSW encouraged family to check in with security, volunteer desk, or employees with badges shoudl they need assistance.  Family stated understanding.   Assessment/plan status:  Information/Referral to [facility] Other assessment/ plan:   Information/referral to community resources:   Hospital Waiting areas  [facility] Volunteer Desk    PATIENTS/FAMILYS RESPONSE  TO PLAN OF CARE: family appreciative of intervention.

## 2013-02-18 DIAGNOSIS — J9600 Acute respiratory failure, unspecified whether with hypoxia or hypercapnia: Secondary | ICD-10-CM

## 2013-02-18 DIAGNOSIS — M545 Low back pain, unspecified: Secondary | ICD-10-CM

## 2013-02-18 DIAGNOSIS — M79609 Pain in unspecified limb: Secondary | ICD-10-CM

## 2013-02-18 DIAGNOSIS — N179 Acute kidney failure, unspecified: Secondary | ICD-10-CM

## 2013-02-18 LAB — GLUCOSE, CAPILLARY
Glucose-Capillary: 118 mg/dL — ABNORMAL HIGH (ref 70–99)
Glucose-Capillary: 145 mg/dL — ABNORMAL HIGH (ref 70–99)
Glucose-Capillary: 157 mg/dL — ABNORMAL HIGH (ref 70–99)

## 2013-02-18 LAB — CBC
MCH: 25.4 pg — ABNORMAL LOW (ref 26.0–34.0)
MCHC: 31.3 g/dL (ref 30.0–36.0)
Platelets: 230 10*3/uL (ref 150–400)
RDW: 17.6 % — ABNORMAL HIGH (ref 11.5–15.5)

## 2013-02-18 LAB — BLOOD GAS, ARTERIAL
Acid-Base Excess: 4.9 mmol/L — ABNORMAL HIGH (ref 0.0–2.0)
Bicarbonate: 29.9 mEq/L — ABNORMAL HIGH (ref 20.0–24.0)
O2 Saturation: 95.3 %
Patient temperature: 97.4
TCO2: 31.5 mmol/L (ref 0–100)

## 2013-02-18 LAB — BASIC METABOLIC PANEL
Calcium: 8.6 mg/dL (ref 8.4–10.5)
GFR calc Af Amer: 16 mL/min — ABNORMAL LOW (ref >90)
GFR calc non Af Amer: 14 mL/min — ABNORMAL LOW (ref >90)
Potassium: 4.7 mEq/L (ref 3.5–5.1)
Sodium: 137 mEq/L (ref 135–145)

## 2013-02-18 MED ORDER — INSULIN ASPART 100 UNIT/ML ~~LOC~~ SOLN
0.0000 [IU] | Freq: Three times a day (TID) | SUBCUTANEOUS | Status: DC
  Administered 2013-02-18: 2 [IU] via SUBCUTANEOUS
  Administered 2013-02-18 – 2013-02-19 (×2): 3 [IU] via SUBCUTANEOUS
  Administered 2013-02-19: 2 [IU] via SUBCUTANEOUS
  Administered 2013-02-19 – 2013-02-20 (×2): 3 [IU] via SUBCUTANEOUS
  Administered 2013-02-20 (×2): 2 [IU] via SUBCUTANEOUS
  Administered 2013-02-21: 5 [IU] via SUBCUTANEOUS
  Administered 2013-02-21: 3 [IU] via SUBCUTANEOUS
  Administered 2013-02-22 (×2): 5 [IU] via SUBCUTANEOUS
  Administered 2013-02-22: 2 [IU] via SUBCUTANEOUS
  Administered 2013-02-23 (×2): 3 [IU] via SUBCUTANEOUS

## 2013-02-18 MED ORDER — DIPHENHYDRAMINE HCL 25 MG PO CAPS
25.0000 mg | ORAL_CAPSULE | Freq: Every evening | ORAL | Status: DC | PRN
  Administered 2013-02-18 – 2013-02-22 (×4): 25 mg via ORAL
  Filled 2013-02-18 (×4): qty 1

## 2013-02-18 NOTE — Care Management Note (Signed)
    Page 1 of 1   02/18/2013     2:23:00 PM   CARE MANAGEMENT NOTE 02/18/2013  Patient:  Timothy Banks, Timothy Banks   Account Number:  [id number]  Date Initiated:  02/18/2013  Documentation initiated by:  Avie Arenas  Subjective/Objective Assessment:   Lives with wife who is legally blind.  Tx from outside hospital for ?? need for dialysis.  At this time has not needed dialysis.     Action/Plan:   Anticipated DC Date:  02/25/2013   Anticipated DC Plan:  HOME W HOME HEALTH SERVICES      DC Planning Services  CM consult      Choice offered to / List presented to:             Status of service:  In process, will continue to follow Medicare Important Message given?   (If response is "NO", the following Medicare IM given date fields will be blank) Date Medicare IM given:   Date Additional Medicare IM given:    Discharge Disposition:    Per UR Regulation:  Reviewed for med. necessity/level of care/duration of stay  If discussed at Long Length of Stay Meetings, dates discussed:    Comments:  [place], [place] Spouse [phone]  02-18-13 2:15pm Avie Arenas, RNBSN [phone] Patient sitting up in room - now on [place].  Wife if room with patient - talked with both.  Live at home together with daughter and her 3 children ranging from 5 to less than 1. He drives, she assists him with bathing and ADL's.  He cooks from a seated position as he tires to easily standing.  Would like a w/c or hoover around for mobility. May need oxygen depending on progression.  At this time feel may not need dialysis.  CM will continue to follow for needs.  Feel will need HH RN and possibly PT on discharge. Physician will need to order.

## 2013-02-18 NOTE — Progress Notes (Addendum)
PULMONARY  / CRITICAL CARE MEDICINE  Name: Timothy Banks MRN: [id number] DOB: 06/09/1956    ADMISSION DATE:  02/17/2013 CONSULTATION DATE:  02/17/2013  REFERRING MD :  Dr Ricki Miller  CHIEF COMPLAINT:  Difficulty breathing, transfer for acute renal failure  BRIEF PATIENT DESCRIPTION: 57 yo morbidly obese male transferred 4/15 from [place] for acute renal failure. Admitted 4/11 with worsening shortness of breath that has greatly limited his mobility.  He has multiple medical problems including DM, CHF, prior MI, , CAD, and CRF.   Baseline cr 1.5 in 2011, 3.1 on adm - diuresed with lasix gtt, on 4/14 diuresed 1.1 L with lasix 160 & zaroxlyn 10  ABg s/o resp acidosis on 4/15 - 7.31 /56  SIGNIFICANT EVENTS / STUDIES:  4/16- distress, neg 3 liters  LINES / TUBES: 4/15 >>>Right IJ HD cath -   CULTURES:  ANTIBIOTICS: None  SUBJECTIVE: Still complaining of shortness of breath., distress  VITAL SIGNS: Temp:  [97.4 F (36.3 C)-98 F (36.7 C)] 97.4 F (36.3 C) (04/16 0758) Pulse Rate:  [57-69] 62 (04/16 0800) Resp:  [15-22] 20 (04/16 0800) BP: (98-148)/(31-93) 138/59 mmHg (04/16 0800) SpO2:  [84 %-97 %] 94 % (04/16 0800) FiO2 (%):  [50 %] 50 % (04/15 2224) Weight:  [397 lb (180.078 kg)] 397 lb (180.078 kg) (04/15 1020) HEMODYNAMICS:   VENTILATOR SETTINGS: Vent Mode:  [-]  FiO2 (%):  [50 %] 50 % INTAKE / OUTPUT: Intake/Output     04/15 0701 - 04/16 0700 04/16 0701 - 04/17 0700   P.O. 640    IV Piggyback 70    Total Intake(mL/kg) 710 (3.9)    Urine (mL/kg/hr) 3955 400 (1.3)   Total Output 3955 400   Net -3245 -400          PHYSICAL EXAMINATION: General:  Well developed, alert and oriented, obese male in NAD ,normal affect. Alert and in chair  ENT - no lesions, no post nasal drip Neck: No JVD, no thyromegaly, no carotid bruits Lungs: accessory muscles, coarse, crackles Cardiovascular: Rhythm regular, heart sounds  normal, no murmurs, 1+ peripheral edema Abdomen:  soft and non-tender, no hepatosplenomegaly, BS normal. Musculoskeletal: No deformities, no cyanosis or clubbing Neuro:  alert, non focal, no asterexis Skin:  Edema    LABS:  Recent Labs Lab 02/17/13 1130 02/18/13 0445  HGB 9.2* 9.2*  WBC 11.1* 11.4*  PLT 202 230  NA 136 137  K 4.9 4.7  CL 96 96  CO2 28 30  GLUCOSE 77 125*  BUN 84* 86*  CREATININE 4.37* 4.35*  CALCIUM 8.7 8.6  MG 2.5  --   PHOS 7.4*  --   AST 11  --   ALT 11  --   ALKPHOS 74  --   BILITOT 0.4  --   PROT 7.6  --   ALBUMIN 3.2*  --     Recent Labs Lab 02/17/13 1507 02/17/13 1912 02/17/13 2327 02/18/13 0352 02/18/13 0724  GLUCAP 112* 130* 115* 118* 124*    CXR: Probable mild pulmonary edema 4/15 - nSR, first degree HB, poor R wave progression  ASSESSMENT / PLAN:  PULMONARY A:Acute hypercarbic respiratory failure Resp acidosis with inadequate compensation on ABG  Likely has underlying OSA/ OHS pulm edema main contributor likely P:   Bipap need to schedule with distress, 4 hours on goal 4 hrs off x 24 hrs Continue neg balance pcxr in am  Monitor for altered mental status abg today May need HD Control afterload, low  threshold nitro drip  CARDIOVASCULAR A: CHF -nml EF 55-60% CAD P: Avoid ACE or ARB  Use hydralazine & nitrates Lower dose of lopressor 50 bid - with hold parameters - due to first degree heart block Hold plavix for now for possible procedures  RENAL A:  ARF - presume ATN now, non oliguric with lasix Inadequate compensation for metab acidosis. US renal - no hydronephrosis Good response to diuretics with 4L UOP. K level is stable P:   Hold  Losartan and Metformin High dose lasix 200mg  q6hrs and chrolthalidone 200mg  X2 doses. Possible HD, will d/w renal plan with distress, abg  Daily weights  GASTROINTESTINAL A:  No issues P:   Renal diet, may need  Npo, need abg  HEMATOLOGIC A:  Anemia of CKD P:  Monitor cbc on sub  q hep  INFECTIOUS A:  No issues Follow  temp curve  ENDOCRINE A:  DM Type II   P:  SSI Give 1/2 dose - lantus 15 for now Hold metformin  NEUROLOGIC A:  No encephalopathy, no asterexis P:  Monitor mental status - bipap if worse, abg  Signed:  Dow Adolph, MD PGY-1 Internal Medicine Teaching Service Pager: [phone] 02/18/2013, 8:40 AM   I have fully examined this patient and agree with above findings.    And edite dinfull   Ccm time 30 min   Mcarthur Rossetti. Tyson Alias, MD, FACP Pgr: [phone] [facility]

## 2013-02-18 NOTE — Progress Notes (Signed)
Spoke with pt about wearing BiPAP and he states that he "can not deal with that mask". Informed pt of benefits from wearing BiPAP but he states he would rather wear the Venturi mask that was placed on him last night. RN aware. Pt currently on 2L [place] with SpO2 of 94%. No distress noted.  RT will continue to monitor.

## 2013-02-18 NOTE — Progress Notes (Signed)
Pt watched Kidney Failure videos 1 and 2 upon arriving to unit.  Nurse will continue to enforce education while pt remains on unit.

## 2013-02-18 NOTE — Progress Notes (Signed)
Assessment:  1 Volume overload in setting of advanced CKD, responding to diuretics 2 Diabetes mellitus with complications  3 Morbid obesity with complications   Plan:  1Cont diuresis 2 Dialysis education   Anticipate dialysis in near future; protect right arm from needle sticks. He is left handed. 4 DC foley  Subjective: Interval History: Diuresed nicely, aprox 4 liters  Objective: Vital signs in last 24 hours: Temp:  [97.4 F (36.3 C)-98 F (36.7 C)] 97.4 F (36.3 C) (04/16 0758) Pulse Rate:  [57-69] 62 (04/16 0800) Resp:  [15-22] 20 (04/16 0800) BP: (98-148)/(31-93) 138/59 mmHg (04/16 0800) SpO2:  [84 %-97 %] 94 % (04/16 0800) FiO2 (%):  [50 %] 50 % (04/15 2224) Weight:  [180.078 kg (397 lb)] 180.078 kg (397 lb) (04/15 1020) Weight change:   Intake/Output from previous day: 04/15 0701 - 04/16 0700 In: 710 [P.O.:640; IV Piggyback:70] Out: 3955 [Urine:3955] Intake/Output this shift: Total I/O In: -  Out: 400 [Urine:400]  General appearance: alert and cooperative Head: Normocephalic, without obvious abnormality, atraumatic Chest wall: no tenderness Right neck IJ catheter Extremities: edema 2-3+  Lab Results:  Recent Labs  02/17/13 1130 02/18/13 0445  WBC 11.1* 11.4*  HGB 9.2* 9.2*  HCT 30.3* 29.4*  PLT 202 230   BMET:  Recent Labs  02/17/13 1130 02/18/13 0445  NA 136 137  K 4.9 4.7  CL 96 96  CO2 28 30  GLUCOSE 77 125*  BUN 84* 86*  CREATININE 4.37* 4.35*  CALCIUM 8.7 8.6   No results found for this basename: PTH,  in the last 72 hours Iron Studies: No results found for this basename: IRON, TIBC, TRANSFERRIN, FERRITIN,  in the last 72 hours Studies/Results: US Renal Port  02/17/2013  *RADIOLOGY REPORT*  Clinical Data: 57 year old male with acute renal failure.  RENAL/URINARY TRACT ULTRASOUND COMPLETE  Comparison:  12/08/2012. CT abdomen and pelvis 07/18/2011.  Findings:  Right Kidney:  No hydronephrosis.  Renal length approximately 10.0 cm.   Cortical echotexture at the upper limits of normal.  Left Kidney:  Difficult to visualize.  No hydronephrosis.  Stable renal cortical thickness.  Echotexture appears mildly increased.  Bladder:  Not visible.  IMPRESSION: No hydronephrosis.  Ventilation in the left kidney limited by large body habitus. Consider chronic medical renal disease.   Original Report Authenticated By: Erskine Speed, M.D.    Dg Chest Port 1 View  02/17/2013  *RADIOLOGY REPORT*  Clinical Data: New right internal jugular hemodialysis catheter  PORTABLE CHEST - 1 VIEW  Comparison: Portable exam 1259 hours compared to 02/17/2013  Findings: New right IJ catheter tip projecting over SVC at the level of the aortic arch. Enlargement of cardiac silhouette post CABG. Pulmonary vascular congestion. Perihilar infiltrate likely mild edema. No gross pleural effusion or pneumothorax.  IMPRESSION: No pneumothorax following right jugular line placement. Probable mild pulmonary edema.   Original Report Authenticated By: Ulyses Southward, M.D.     Scheduled: . calcium acetate  667 mg Oral TID WC  . Chlorhexidine Gluconate Cloth  6 each Topical Q0600  . chlorthalidone  200 mg Oral BID  . furosemide  200 mg Intravenous Q6H  . heparin  5,000 Units Subcutaneous Q8H  . hydrALAZINE  50 mg Oral Q6H  . insulin aspart  0-20 Units Subcutaneous Q4H  . insulin glargine  15 Units Subcutaneous QHS  . isosorbide mononitrate  10 mg Oral BID  . metoprolol  50 mg Oral BID  . mupirocin ointment  1 application Nasal BID  .  simvastatin  20 mg Oral QPM     LOS: 1 day   Brittay Mogle C 02/18/2013,8:21 AM

## 2013-02-19 ENCOUNTER — Inpatient Hospital Stay (HOSPITAL_COMMUNITY): Payer: Medicare Other

## 2013-02-19 DIAGNOSIS — E8770 Fluid overload, unspecified: Secondary | ICD-10-CM

## 2013-02-19 LAB — CBC
MCH: 25.1 pg — ABNORMAL LOW (ref 26.0–34.0)
MCHC: 31.7 g/dL (ref 30.0–36.0)
Platelets: 223 10*3/uL (ref 150–400)
RDW: 17.6 % — ABNORMAL HIGH (ref 11.5–15.5)

## 2013-02-19 LAB — GLUCOSE, CAPILLARY
Glucose-Capillary: 133 mg/dL — ABNORMAL HIGH (ref 70–99)
Glucose-Capillary: 132 mg/dL — ABNORMAL HIGH (ref 70–99)
Glucose-Capillary: 186 mg/dL — ABNORMAL HIGH (ref 70–99)

## 2013-02-19 MED ORDER — FUROSEMIDE 10 MG/ML IJ SOLN
100.0000 mg | Freq: Four times a day (QID) | INTRAVENOUS | Status: DC
  Administered 2013-02-19 – 2013-02-20 (×4): 100 mg via INTRAVENOUS
  Filled 2013-02-19 (×6): qty 10

## 2013-02-19 NOTE — Progress Notes (Signed)
Assessment:  1 Volume overload in setting of advanced CKD, responding nicely  to diuretics  2 Diabetes mellitus with complications  3 Morbid obesity with complications  Plan:  1Cont diuresis, reduce dosage  2 Dialysis education  Anticipate dialysis in near future; protect right arm from needle sticks. He is left handed.  4 DC foley   Subjective: Interval History: Diuresing.  Bloody drainage yesterday after foley removed.  He reports a similar episode after last foley removal  Objective: Vital signs in last 24 hours: Temp:  [97.5 F (36.4 C)-97.9 F (36.6 C)] 97.9 F (36.6 C) (04/17 0821) Pulse Rate:  [58-64] 58 (04/17 1000) Resp:  [15-23] 17 (04/17 1000) BP: (123-157)/(46-89) 147/49 mmHg (04/17 1000) SpO2:  [91 %-97 %] 97 % (04/17 0821) Weight:  [172.8 kg (380 lb 15.3 oz)] 172.8 kg (380 lb 15.3 oz) (04/17 0500) Weight change: -7.278 kg (-16 lb 0.7 oz)  Intake/Output from previous day: 04/16 0701 - 04/17 0700 In: 240 [P.O.:240] Out: 6725 [Urine:6725] Intake/Output this shift:    General appearance: alert and cooperative GI: soft, non-tender; bowel sounds normal; no masses,  no organomegaly and protuberant Extremities: edema 3+ Presacral edema present Obese  Lab Results:  Recent Labs  02/18/13 0445 02/19/13 0912  WBC 11.4* 8.7  HGB 9.2* 9.1*  HCT 29.4* 28.7*  PLT 230 223   BMET:  Recent Labs  02/17/13 1130 02/18/13 0445  NA 136 137  K 4.9 4.7  CL 96 96  CO2 28 30  GLUCOSE 77 125*  BUN 84* 86*  CREATININE 4.37* 4.35*  CALCIUM 8.7 8.6   No results found for this basename: PTH,  in the last 72 hours Iron Studies: No results found for this basename: IRON, TIBC, TRANSFERRIN, FERRITIN,  in the last 72 hours Studies/Results: US Renal Port  02/17/2013  *RADIOLOGY REPORT*  Clinical Data: 57 year old male with acute renal failure.  RENAL/URINARY TRACT ULTRASOUND COMPLETE  Comparison:  12/08/2012. CT abdomen and pelvis 07/18/2011.  Findings:  Right Kidney:  No  hydronephrosis.  Renal length approximately 10.0 cm.  Cortical echotexture at the upper limits of normal.  Left Kidney:  Difficult to visualize.  No hydronephrosis.  Stable renal cortical thickness.  Echotexture appears mildly increased.  Bladder:  Not visible.  IMPRESSION: No hydronephrosis.  Ventilation in the left kidney limited by large body habitus. Consider chronic medical renal disease.   Original Report Authenticated By: Erskine Speed, M.D.    Dg Chest Port 1 View  02/19/2013  *RADIOLOGY REPORT*  Clinical Data:  Pulmonary edema  PORTABLE CHEST - 1 VIEW  Comparison: Portable exam 0608 hours compared to 02/17/2013  Findings: Right jugular central venous catheter with tip projecting over SVC. Enlargement of cardiac silhouette post CABG. Pulmonary vascular congestion. Decreased lung volumes. Accentuation of perihilar markings likely represents mild pulmonary edema. No segmental consolidation, pleural effusion, or pneumothorax.  IMPRESSION: Probable mild CHF.   Original Report Authenticated By: Ulyses Southward, M.D.    Dg Chest Port 1 View  02/17/2013  *RADIOLOGY REPORT*  Clinical Data: New right internal jugular hemodialysis catheter  PORTABLE CHEST - 1 VIEW  Comparison: Portable exam 1259 hours compared to 02/17/2013  Findings: New right IJ catheter tip projecting over SVC at the level of the aortic arch. Enlargement of cardiac silhouette post CABG. Pulmonary vascular congestion. Perihilar infiltrate likely mild edema. No gross pleural effusion or pneumothorax.  IMPRESSION: No pneumothorax following right jugular line placement. Probable mild pulmonary edema.   Original Report Authenticated By: Ulyses Southward,  M.D.    Scheduled: . calcium acetate  667 mg Oral TID WC  . Chlorhexidine Gluconate Cloth  6 each Topical Q0600  . furosemide  200 mg Intravenous Q6H  . heparin  5,000 Units Subcutaneous Q8H  . hydrALAZINE  50 mg Oral Q6H  . insulin aspart  0-15 Units Subcutaneous TID WC  . insulin glargine  15 Units  Subcutaneous QHS  . isosorbide mononitrate  10 mg Oral BID  . metoprolol  50 mg Oral BID  . mupirocin ointment  1 application Nasal BID  . simvastatin  20 mg Oral QPM    LOS: 2 days   Giorgia Wahler C 02/19/2013,10:28 AM

## 2013-02-19 NOTE — Progress Notes (Signed)
PT stated that he can not wear the BIPAP he says it chokes him

## 2013-02-19 NOTE — Progress Notes (Signed)
PULMONARY  / CRITICAL CARE MEDICINE  Name: Timothy Banks MRN: [id number] DOB: [birth year]    ADMISSION DATE:  02/17/2013 CONSULTATION DATE:  02/17/2013  REFERRING MD :  Dr Ricki Miller  CHIEF COMPLAINT:  Difficulty breathing, transfer for acute renal failure  BRIEF PATIENT DESCRIPTION: 57 yo morbidly obese male transferred 4/15 from [place] for acute renal failure. Admitted 4/11 with worsening shortness of breath that has greatly limited his mobility.  He has multiple medical problems including DM, CHF, prior MI, , CAD, and CRF.   Baseline cr 1.5 in 2011, 3.1 on adm - diuresed with lasix gtt, on 4/14 diuresed 1.1 L with lasix 160 & zaroxlyn 10  ABg s/o resp acidosis on 4/15 - 7.31 /56  SIGNIFICANT EVENTS / STUDIES:  4/16- distress, neg 3 liters 4/17 Episode of severe hematuria shortly after removal of   LINES / TUBES: Right IJ HD cath 4/15 >>  CULTURES: MRSA PCR 4/15 >> POS  ANTIBIOTICS: None  SUBJECTIVE: No new complaints. Feels ready to go home. No distress  VITAL SIGNS: Temp:  [97.5 F (36.4 C)-97.9 F (36.6 C)] 97.9 F (36.6 C) (04/17 0821) Pulse Rate:  [58-64] 62 (04/17 1157) Resp:  [15-23] 21 (04/17 1157) BP: (123-157)/(45-89) 156/55 mmHg (04/17 1158) SpO2:  [91 %-97 %] 97 % (04/17 1157) Weight:  [172.8 kg (380 lb 15.3 oz)] 172.8 kg (380 lb 15.3 oz) (04/17 0500) HEMODYNAMICS:   VENTILATOR SETTINGS:   INTAKE / OUTPUT: Intake/Output     04/16 0701 - 04/17 0700 04/17 0701 - 04/18 0700   P.O. 240    IV Piggyback     Total Intake(mL/kg) 240 (1.4)    Urine (mL/kg/hr) 6725 (1.6) 800 (0.6)   Total Output 6725 800   Net -6485 -800        Urine Occurrence 1 x 1 x     PHYSICAL EXAMINATION: General: NAD  HEENT: WNL Neck: R IJ HD cath site clean Lungs: no wheezes.  Distant BS Cardiovascular: RRR s M Abdomen: obese, soft, NT, NABS Ext: 2-3+ LE edema, symmetric Neuro:  No focal deficits    LABS:  Recent Labs Lab 02/17/13 1130 02/18/13 0445  02/18/13 1045 02/19/13 0912  HGB 9.2* 9.2*  --  9.1*  WBC 11.1* 11.4*  --  8.7  PLT 202 230  --  223  NA 136 137  --   --   K 4.9 4.7  --   --   CL 96 96  --   --   CO2 28 30  --   --   GLUCOSE 77 125*  --   --   BUN 84* 86*  --   --   CREATININE 4.37* 4.35*  --   --   CALCIUM 8.7 8.6  --   --   MG 2.5  --   --   --   PHOS 7.4*  --   --   --   AST 11  --   --   --   ALT 11  --   --   --   ALKPHOS 74  --   --   --   BILITOT 0.4  --   --   --   PROT 7.6  --   --   --   ALBUMIN 3.2*  --   --   --   PHART  --   --  7.382  --   PCO2ART  --   --  51.0*  --  PO2ART  --   --  73.6*  --     Recent Labs Lab 02/18/13 1532 02/18/13 1625 02/18/13 2139 02/19/13 0905 02/19/13 1201  GLUCAP 186* 145* 157* 133* 132*    CXR: CM, interstitial prominence - probably improved   ASSESSMENT / PLAN:  PULMONARY A: Chronic hypercarbic respiratory failure - likely OHS Acute resp acidosis, resolved  pulm edema - -improved P:   Cont to wean supplemental O2 as tolerated Keep SpO2 90-94% Cont mandatory nocturnal BiPAP for presumed OSA   CARDIOVASCULAR A: CHF -nml EF 55-60% H/O CAD P:  Cont current Rx  RENAL A:  ARF - presume ATN now, non oliguric with lasix Severe hypervolemia Hematuria X 1 4/17 - likely related to foley cath P:   Hold  Losartan and Metformin Lasix dose adjusted by Renal. Decisions re: HD per Renal  If further hematuria, consider Urology eval   GASTROINTESTINAL A:  Obesity P:   Cont Renal diet  HEMATOLOGIC A:  Anemia of CKD P:  Monitor cbc on sub  q hep   INFECTIOUS A:  No issues Follow temp curve  ENDOCRINE A:  DM Type II   P:  Cont SSI and Lantus Hold metformin due to renal failure   NEUROLOGIC A:  No acute issues    Transfer to Renal floor with Tele TRH to assume care as of 4/18 AM and PCCM to sign off. Please call if we can be of further assistance   Billy Fischer, MD ; [facility] [phone].  After 5:30 PM or  weekends, call [phone]

## 2013-02-19 NOTE — Progress Notes (Addendum)
0930:  Reassessment of pt; pt is comfortable and is no longer bleeding from penis at this time.  Nurse will continue to montior.  9604:  MD Byrum returned nurse page, acknowledged concerns.  Instructed nurse to obtain STAT CBC.  Nurse will carry out orders and instructed and will continue to monitor pt.  0900:  Pt is asymptomatic, Vitals post epidsode are documented in epic.  Upon reassessment it appears that blood had stopped draining from penis.  Nurse contacted CCM again for advisement regarding current situation, nurse will await instruction.  0830: Upon am assessment nurse was informed by pt that his urine was bloody, reported that the blood had occurred most of the night.  Pt ambulated to bathroom, had bowel movement, comode was full of massive amounts of blood coming from pt penis.  Nurse instructed pt to remain on toilet to determine actual source of bleeding.  Nurse paged CCM.  Upon examination it was determined that the blood was constantly oozing out of his penis.  Nurse was able to get pt back to chair while blood continued to ooze out of penis.  Nurse pushed E link button and requested on call MD contact nurse.

## 2013-02-19 NOTE — Progress Notes (Signed)
Clinical Social Worker staffed case with [employer].  CSW to sign off at this time, please re consult if needed.     Angelia Mould, MSW, [place] [phone]

## 2013-02-20 ENCOUNTER — Encounter (HOSPITAL_COMMUNITY): Payer: Self-pay | Admitting: Thoracic Diseases

## 2013-02-20 DIAGNOSIS — I2510 Atherosclerotic heart disease of native coronary artery without angina pectoris: Secondary | ICD-10-CM

## 2013-02-20 DIAGNOSIS — I6529 Occlusion and stenosis of unspecified carotid artery: Secondary | ICD-10-CM

## 2013-02-20 DIAGNOSIS — N186 End stage renal disease: Secondary | ICD-10-CM

## 2013-02-20 LAB — COMPREHENSIVE METABOLIC PANEL
AST: 21 U/L (ref 0–37)
Albumin: 3.2 g/dL — ABNORMAL LOW (ref 3.5–5.2)
Alkaline Phosphatase: 72 U/L (ref 39–117)
Chloride: 92 mEq/L — ABNORMAL LOW (ref 96–112)
Creatinine, Ser: 3.99 mg/dL — ABNORMAL HIGH (ref 0.50–1.35)
Potassium: 3.8 mEq/L (ref 3.5–5.1)
Total Bilirubin: 0.4 mg/dL (ref 0.3–1.2)
Total Protein: 7.6 g/dL (ref 6.0–8.3)

## 2013-02-20 LAB — GLUCOSE, CAPILLARY
Glucose-Capillary: 121 mg/dL — ABNORMAL HIGH (ref 70–99)
Glucose-Capillary: 148 mg/dL — ABNORMAL HIGH (ref 70–99)
Glucose-Capillary: 158 mg/dL — ABNORMAL HIGH (ref 70–99)
Glucose-Capillary: 259 mg/dL — ABNORMAL HIGH (ref 70–99)

## 2013-02-20 LAB — CBC
HCT: 27.3 % — ABNORMAL LOW (ref 39.0–52.0)
MCH: 25.4 pg — ABNORMAL LOW (ref 26.0–34.0)
MCV: 79.8 fL (ref 78.0–100.0)
Platelets: 240 10*3/uL (ref 150–400)
RBC: 3.42 MIL/uL — ABNORMAL LOW (ref 4.22–5.81)

## 2013-02-20 MED ORDER — FUROSEMIDE 10 MG/ML IJ SOLN
100.0000 mg | Freq: Three times a day (TID) | INTRAVENOUS | Status: DC
  Administered 2013-02-20 (×2): 100 mg via INTRAVENOUS
  Filled 2013-02-20 (×5): qty 10

## 2013-02-20 MED ORDER — ASPIRIN EC 325 MG PO TBEC
325.0000 mg | DELAYED_RELEASE_TABLET | Freq: Every day | ORAL | Status: DC
  Filled 2013-02-20: qty 1

## 2013-02-20 MED ORDER — INSULIN GLARGINE 100 UNIT/ML ~~LOC~~ SOLN
24.0000 [IU] | Freq: Every day | SUBCUTANEOUS | Status: DC
  Administered 2013-02-20 – 2013-02-22 (×3): 24 [IU] via SUBCUTANEOUS
  Filled 2013-02-20 (×4): qty 0.24

## 2013-02-20 MED ORDER — FUROSEMIDE 10 MG/ML IJ SOLN
100.0000 mg | Freq: Three times a day (TID) | INTRAVENOUS | Status: DC
  Administered 2013-02-21 (×2): 100 mg via INTRAVENOUS
  Filled 2013-02-20 (×3): qty 10

## 2013-02-20 MED ORDER — GLIPIZIDE 10 MG PO TABS
10.0000 mg | ORAL_TABLET | Freq: Two times a day (BID) | ORAL | Status: DC
  Filled 2013-02-20 (×2): qty 1

## 2013-02-20 NOTE — Progress Notes (Signed)
Assessment:  1 Volume overload in setting of advanced CKD, responding nicely to diuretics  2 Diabetes mellitus with complications  3 Morbid obesity with complications  Plan:  1Cont diuresis, reduce dosage to tid  2 Dialysis education  Anticipate dialysis in near future; protect right arm from needle sticks. He is left handed.  3 Vein Mapping 4 Ask VVS to see   Subjective: Interval History: Blood per urethra   Objective: Vital signs in last 24 hours: Temp:  [97.1 F (36.2 C)-98.4 F (36.9 C)] 97.1 F (36.2 C) (04/18 0805) Pulse Rate:  [56-64] 58 (04/18 0805) Resp:  [16-22] 18 (04/18 0805) BP: (113-157)/(27-55) 113/27 mmHg (04/18 0805) SpO2:  [90 %-98 %] 95 % (04/18 0805) Weight change:   Intake/Output from previous day: 04/17 0701 - 04/18 0700 In: 400 [P.O.:220; IV Piggyback:180] Out: 2275 [Urine:2275] Intake/Output this shift:    General appearance: alert and cooperative Back: presacral edema GI: soft, non-tender; bowel sounds normal; no masses,  no organomegaly and protuberant Extremities: edema 2-3+  Lab Results:  Recent Labs  02/19/13 0912 02/20/13 0400  WBC 8.7 10.9*  HGB 9.1* 8.7*  HCT 28.7* 27.3*  PLT 223 240   BMET:  Recent Labs  02/18/13 0445 02/20/13 0400  NA 137 136  K 4.7 3.8  CL 96 92*  CO2 30 34*  GLUCOSE 125* 136*  BUN 86* 91*  CREATININE 4.35* 3.99*  CALCIUM 8.6 8.7   No results found for this basename: PTH,  in the last 72 hours Iron Studies: No results found for this basename: IRON, TIBC, TRANSFERRIN, FERRITIN,  in the last 72 hours Studies/Results: Dg Chest Port 1 View  02/19/2013  *RADIOLOGY REPORT*  Clinical Data:  Pulmonary edema  PORTABLE CHEST - 1 VIEW  Comparison: Portable exam 0608 hours compared to 02/17/2013  Findings: Right jugular central venous catheter with tip projecting over SVC. Enlargement of cardiac silhouette post CABG. Pulmonary vascular congestion. Decreased lung volumes. Accentuation of perihilar markings  likely represents mild pulmonary edema. No segmental consolidation, pleural effusion, or pneumothorax.  IMPRESSION: Probable mild CHF.   Original Report Authenticated By: Ulyses Southward, M.D.    Scheduled: . calcium acetate  667 mg Oral TID WC  . Chlorhexidine Gluconate Cloth  6 each Topical Q0600  . furosemide  100 mg Intravenous Q6H  . heparin  5,000 Units Subcutaneous Q8H  . hydrALAZINE  50 mg Oral Q6H  . insulin aspart  0-15 Units Subcutaneous TID WC  . insulin glargine  15 Units Subcutaneous QHS  . isosorbide mononitrate  10 mg Oral BID  . metoprolol  50 mg Oral BID  . mupirocin ointment  1 application Nasal BID  . simvastatin  20 mg Oral QPM    LOS: 3 days   Trina Asch C 02/20/2013,8:13 AM

## 2013-02-20 NOTE — Progress Notes (Addendum)
Patient had moderate amount of hematuria.  Urine has been clear in urinal tonight.  Per patient it is bloody when he sits on the toilet but clear when he stands and uses the urinal.  Patient still has blood trickling out from penis. Reminded patient that we need to measure all urine. Lenny Pastel, NP notified; CBC ordered. Steele Berg RN

## 2013-02-20 NOTE — Progress Notes (Signed)
[facility] Progress Note [place] TEAM 1 - Stepdown/ICU TEAM   GEOFREY SILLIMAN ZOX:[id number] DOB: May 30, 1956 DOA: 02/17/2013 PCP: Dr Ricki Miller   Brief narrative: 57 yo morbidly obese male transferred 4/15 from [facility] for acute renal failure. Admitted 4/11 with worsening shortness of breath that greatly limited his mobility. He has multiple chronic medical problems including DM, CHF, prior MI, , CAD, and CRF.  Baseline cr 1.5 in 2011, 3.1 on adm - diuresed with lasix gtt.   Assessment/Plan:  Acute renal failure presumed ATN - non oliguric with lasix - baseline crt felt to be 1.5 - appears to be progressing to long term HD - Nephrology following with plans for VVS to place permanent access  Chronic hypercarbic respiratory failure likely OHS - well compensated at this time   Acute resp acidosis resolved   Severe hypervolemia with pulm edema Improved - due to acute renal failure - net negative 12L fluid balance at this time   Hematuria X 1 4/17 - again 4/18 related to foley cath - is recurring - stop heparin and ASA and follow - pt reports having this problem in the past related to use of foley cath  Obesity  DM2 CBG well controlled at this time   CAD s/p CABG 2002 Asymptomatic at this time - having to hold ASA currently due to signif hematuria - resume asap  Code Status: FULL Family Communication: Spoke with patient and wife at bedside Disposition Plan: tele bed   Consultants: Nephrology  Procedures: Right IJ HD cath 4/15 >>  Antibiotics: none  DVT prophylaxis: SCDs only (stopping SQ heparin due to gross recurring hematuria)  HPI/Subjective: The patient is resting comfortably in bed.  He reports another episode of significant gross hematuria this morning associated with passing of some clots.  He states he had a similar episode approximately one year ago associated with placement of a Foley catheter.  He denies chest pain shortness of breath nausea  or vomiting.  He reports that he's feeling much better than he was when he initially presented to the hospital.  Objective: Blood pressure 142/63, pulse 66, temperature 98.6 F (37 C), temperature source Oral, resp. rate 18, height 5\' 7"  (1.702 m), weight 172.8 kg (380 lb 15.3 oz), SpO2 95.00%.  Intake/Output Summary (Last 24 hours) at 02/20/13 1234 Last data filed at 02/20/13 0900  Gross per 24 hour  Intake    400 ml  Output   1775 ml  Net  -1375 ml   Exam: General: No acute respiratory distress Lungs: Very distant breath sounds related to body habitus with no focal crackles and no wheezes appreciable Cardiovascular: Distant heart sounds without appreciable gallop or rub Abdomen: Morbidly obese , nontender, nondistended, soft, bowel sounds positive, no rebound, no ascites, no appreciable mass Extremities: 2+ bilateral lower extremity edema without cyanosis  Data Reviewed: Basic Metabolic Panel:  Recent Labs Lab 02/17/13 1130 02/18/13 0445 02/20/13 0400  NA 136 137 136  K 4.9 4.7 3.8  CL 96 96 92*  CO2 28 30 34*  GLUCOSE 77 125* 136*  BUN 84* 86* 91*  CREATININE 4.37* 4.35* 3.99*  CALCIUM 8.7 8.6 8.7  MG 2.5  --   --   PHOS 7.4*  --   --    Liver Function Tests:  Recent Labs Lab 02/17/13 1130 02/20/13 0400  AST 11 21  ALT 11 18  ALKPHOS 74 72  BILITOT 0.4 0.4  PROT 7.6 7.6  ALBUMIN 3.2* 3.2*   CBC:  Recent Labs Lab 02/17/13 1130 02/18/13 0445 02/19/13 0912 02/20/13 0400  WBC 11.1* 11.4* 8.7 10.9*  NEUTROABS 9.1*  --   --   --   HGB 9.2* 9.2* 9.1* 8.7*  HCT 30.3* 29.4* 28.7* 27.3*  MCV 81.2 81.2 79.3 79.8  PLT 202 230 223 240   CBG:  Recent Labs Lab 02/19/13 2036 02/20/13 0351 02/20/13 0806 02/20/13 1036 02/20/13 1152  GLUCAP 150* 119* 121* 143* 148*    Recent Results (from the past 240 hour(s))  MRSA PCR SCREENING     Status: Abnormal   Collection Time    02/17/13 10:23 AM      Result Value Range Status   MRSA by PCR POSITIVE (*)  NEGATIVE Final   Comment:            The GeneXpert MRSA Assay (FDA     approved for NASAL specimens     only), is one component of a     comprehensive MRSA colonization     surveillance program. It is not     intended to diagnose MRSA     infection nor to guide or     monitor treatment for     MRSA infections.     RESULT CALLED TO, READ BACK BY AND VERIFIED WITH:     C. MCKEOWN RN 12:40 02/17/13 ([email or web address])     Studies:  Recent x-ray studies have been reviewed in detail by the Attending Physician  Scheduled Meds:  Scheduled Meds: . aspirin  325 mg Oral Daily  . calcium acetate  667 mg Oral TID WC  . Chlorhexidine Gluconate Cloth  6 each Topical Q0600  . furosemide  100 mg Intravenous TID  . glipiZIDE  10 mg Oral BID AC  . heparin  5,000 Units Subcutaneous Q8H  . hydrALAZINE  50 mg Oral Q6H  . insulin aspart  0-15 Units Subcutaneous TID WC  . insulin glargine  15 Units Subcutaneous QHS  . isosorbide mononitrate  10 mg Oral BID  . metoprolol  50 mg Oral BID  . mupirocin ointment  1 application Nasal BID  . simvastatin  20 mg Oral QPM   Continuous Infusions:   Time spent on care of this patient:   [facility] T  [facility] Office  [phone] Pager - Text Page per Loretha Stapler as per below:  On-Call/Text Page:      [email or web address]      password [email or web address]  If 7PM-7AM, please contact night-coverage [email or web address] Password [facility] 02/20/2013, 12:34 PM   LOS: 3 days

## 2013-02-20 NOTE — Progress Notes (Signed)
Patient refusing QHS BiPap at this time.  Patient does not wear any form of CPAP or BiPap at home.

## 2013-02-20 NOTE — Progress Notes (Signed)
Patient arrived via w/c from 2900, alert and oriented and denies c/o's.  Patient and wife oriented to unit and room.  Noted that patient had red and excoriated areas in bil groin folds - cleaned and dried.  Continue to monitor.

## 2013-02-20 NOTE — Consult Note (Signed)
[facility] [place] CONSULT NOTE 02/20/2013 DOB: 05/31/1956 MRN : [id number]  CC:ESRD Referring Physician: Dr. A Powell  History of Present Illness: Timothy Banks is a 56 y.o. male With hx DM, morbid obesity and volume overload with severe CKD who will need HD soon. He is LHD. Dr. Powell asked us to assess for HD access. He is not yet on HD  Past Medical History  Diagnosis Date  . Myocardial infarction   . Diabetes mellitus without complication   . CHF (congestive heart failure)   . Asthma   . Hyperlipidemia   . Hypertension   . Chronic kidney disease 02/17/2013    ACUTE RENAL    Past Surgical History  Procedure Laterality Date  . Endarterectomy  2002    right side  . Coronary artery bypass graft  2002     ROS: [x] Positive  [ ] Denies    General: [ ] Weight loss, [ ] Fever, [ ] chills [x] obese Neurologic: [ ] Dizziness, [ ] Blackouts, [ ] Seizure [ ] Stroke, [ ] "Mini stroke", [ ] Slurred speech, [ ] Temporary blindness; [ ] weakness in arms or legs, [ ] Hoarseness Cardiac: [ ] Chest pain/pressure, [x ] Shortness of breath at rest [ x] Shortness of breath with exertion, [ ] Atrial fibrillation or irregular heartbeat Vascular: [ ] Pain in legs with walking, [ ] Pain in legs at rest, [ ] Pain in legs at night,  [ ] Non-healing ulcer, [ ] Blood clot in vein/DVT,   Pulmonary: [ ] Home oxygen, [ ] Productive cough, [ ] Coughing up blood, [ ] Asthma,  [ ] Wheezing Musculoskeletal:  [ ] Arthritis, [x ] Low back pain, [ ] Joint pain Hematologic: [ ] Easy Bruising, [ ] Anemia; [ ] Hepatitis Gastrointestinal: [ ] Blood in stool, [ ] Gastroesophageal Reflux/heartburn, [ ] Trouble swallowing Urinary: [x ] chronic Kidney disease, [ ] on HD - [ ] MWF or [ ] TTHS, [ ] Burning with urination, [ ] Difficulty urinating Skin: [ ] Rashes, [ ] Wounds Psychological: [ ] Anxiety, [ ] Depression  Social History History  Substance Use Topics  . Smoking status: Current  Every Day Smoker -- 1.00 packs/day for 25 years    Types: Cigarettes    Start date: 11/05/1973  . Smokeless tobacco: Never Used     Comment: quit for 10 years, started back 9 mo ago  . Alcohol Use: No    Family History Family History  Problem Relation Age of Onset  . Diabetes Mother   . Heart disease Mother   . Diabetes Father   . Heart disease Father    No Known Allergies  Current Facility-Administered Medications  Medication Dose Route Frequency Provider Last Rate Last Dose  . 0.9 %  sodium chloride infusion  250 mL Intravenous PRN Rakesh V Alva, MD 20 mL/hr at 02/18/13 2157 250 mL at 02/18/13 2157  . acetaminophen (TYLENOL) tablet 650 mg  650 mg Oral Q6H PRN Richard Kazibwe, MD   650 mg at 02/20/13 0406  . albuterol (PROVENTIL HFA;VENTOLIN HFA) 108 (90 BASE) MCG/ACT inhaler 2 puff  2 puff Inhalation Q6H PRN Rakesh V Alva, MD      . calcium acetate (PHOSLO) capsule 667 mg  667 mg Oral TID WC Alvin C Powell, MD   667 mg at 02/20/13 0822  . Chlorhexidine Gluconate Cloth 2 % PADS 6 each  6 each Topical Q0600 Daniel   J Feinstein, MD   6 each at 02/20/13 0623  . diphenhydrAMINE (BENADRYL) capsule 25 mg  25 mg Oral QHS PRN Patrick E Wright, MD   25 mg at 02/19/13 2238  . furosemide (LASIX) 100 mg in dextrose 5 % 50 mL IVPB  100 mg Intravenous TID Alvin C Powell, MD      . heparin injection 1,200-2,400 Units  1.2-2.4 mL Intravenous PRN Daniel J Feinstein, MD      . heparin injection 5,000 Units  5,000 Units Subcutaneous Q8H Rakesh V Alva, MD   5,000 Units at 02/19/13 2226  . hydrALAZINE (APRESOLINE) tablet 50 mg  50 mg Oral Q6H Rakesh V Alva, MD   50 mg at 02/20/13 0052  . insulin aspart (novoLOG) injection 0-15 Units  0-15 Units Subcutaneous TID WC Solianny D Kennerly, MD   2 Units at 02/20/13 0823  . insulin glargine (LANTUS) injection 15 Units  15 Units Subcutaneous QHS Rakesh V Alva, MD   15 Units at 02/19/13 2223  . isosorbide mononitrate (ISMO,MONOKET) tablet 10 mg  10 mg Oral BID  Rakesh V Alva, MD   10 mg at 02/19/13 2224  . metoprolol (LOPRESSOR) tablet 50 mg  50 mg Oral BID Rakesh V Alva, MD   50 mg at 02/19/13 2224  . mupirocin ointment (BACTROBAN) 2 % 1 application  1 application Nasal BID Daniel J Feinstein, MD   1 application at 02/19/13 2225  . simvastatin (ZOCOR) tablet 20 mg  20 mg Oral QPM Rakesh V Alva, MD   20 mg at 02/19/13 1802     Imaging: Dg Chest Port 1 View  02/19/2013  *RADIOLOGY REPORT*  Clinical Data:  Pulmonary edema  PORTABLE CHEST - 1 VIEW  Comparison: Portable exam 0608 hours compared to 02/17/2013  Findings: Right jugular central venous catheter with tip projecting over SVC. Enlargement of cardiac silhouette post CABG. Pulmonary vascular congestion. Decreased lung volumes. Accentuation of perihilar markings likely represents mild pulmonary edema. No segmental consolidation, pleural effusion, or pneumothorax.  IMPRESSION: Probable mild CHF.   Original Report Authenticated By: Mark Boles, M.D.     Significant Diagnostic Studies: CBC Lab Results  Component Value Date   WBC 10.9* 02/20/2013   HGB 8.7* 02/20/2013   HCT 27.3* 02/20/2013   MCV 79.8 02/20/2013   PLT 240 02/20/2013    BMET    Component Value Date/Time   NA 136 02/20/2013 0400   K 3.8 02/20/2013 0400   CL 92* 02/20/2013 0400   CO2 34* 02/20/2013 0400   GLUCOSE 136* 02/20/2013 0400   BUN 91* 02/20/2013 0400   CREATININE 3.99* 02/20/2013 0400   CALCIUM 8.7 02/20/2013 0400   GFRNONAA 15* 02/20/2013 0400   GFRAA 18* 02/20/2013 0400    COAG No results found for this basename: INR, PROTIME   No results found for this basename: PTT     Physical Examination BP Readings from Last 3 Encounters:  02/20/13 113/27  10/01/12 175/85   Temp Readings from Last 3 Encounters:  02/20/13 97.1 F (36.2 C) Oral   SpO2 Readings from Last 3 Encounters:  02/20/13 95%  10/01/12 93%   Pulse Readings from Last 3 Encounters:  02/20/13 58  10/01/12 77    General:  WDWN obese male in NAD Gait:  Normal HENT: WNL Eyes: Pupils equal Pulmonary: normal mildly labored breathing  Cardiac: RRR, Abdomen: soft, NT, no masses Skin: no rashes, ulcers noted Vascular Exam/Pulses: 2+ radial pulse bilaterally Extremities without ischemic changes, no Gangrene , no   cellulitis; no open wounds;  Musculoskeletal: no muscle wasting or atrophy  Neurologic: A&O X 3; Appropriate Affect ;  SENSATION: normal; MOTOR FUNCTION: Pt has good and equal strength in all extremities - 5/5 Speech is fluent/normal  Non-Invasive Vascular Imaging: vein mapping pending  ASSESSMENT/PLAN:Timothy Banks is a 56 y.o. male With ESRD, CAD, DM and acute resp distress who is being diuresed and will need HD in the near future. CR 3.99 Plan Vein mapping and to place AVF/AVGG next week. Pt obesity poses issue of need for second surgery to superficialize a fistula.  Pt had CABG and right carotid endarterectomy 12 years ago here at [place] - ? F/U of carotid arteries by cardiology. No F/U noted in EPIC in our office    History and details as above.  Pt is left handed.  Vein mapping is pending.  Will review result and decide on access plan and date.    Alyviah Crandle, MD [facility] [place] Office: [phone] Pager: [phone]  

## 2013-02-20 NOTE — Progress Notes (Signed)
Pt call this nurse in room, stating "there is more blood". Noted pt to be bleeding from penis again. This occurrence has more blood in commode than previous times. Wife and Pt state "It was a lot more than that in the morning". Noted clots in toilet. Pt complaining of 10/10 burning sensation pain in penis and "im cold". Penis cleaned, noted clots, some clots removed but not all. Labs drawn from RIJ, blood sugar checked, and Elink called. MD made aware of pt situation and compare to previous AM situation. Order for Labs. Will continue to monitor.

## 2013-02-21 DIAGNOSIS — E8779 Other fluid overload: Secondary | ICD-10-CM

## 2013-02-21 DIAGNOSIS — Z01810 Encounter for preprocedural cardiovascular examination: Secondary | ICD-10-CM

## 2013-02-21 LAB — CBC
MCH: 24.6 pg — ABNORMAL LOW (ref 26.0–34.0)
MCHC: 31 g/dL (ref 30.0–36.0)
MCV: 79.3 fL (ref 78.0–100.0)
Platelets: 238 10*3/uL (ref 150–400)
RBC: 3.38 MIL/uL — ABNORMAL LOW (ref 4.22–5.81)
RDW: 17.2 % — ABNORMAL HIGH (ref 11.5–15.5)

## 2013-02-21 LAB — GLUCOSE, CAPILLARY
Glucose-Capillary: 116 mg/dL — ABNORMAL HIGH (ref 70–99)
Glucose-Capillary: 171 mg/dL — ABNORMAL HIGH (ref 70–99)

## 2013-02-21 MED ORDER — ALPRAZOLAM 0.5 MG PO TABS
0.5000 mg | ORAL_TABLET | Freq: Once | ORAL | Status: AC
  Administered 2013-02-21: 0.5 mg via ORAL
  Filled 2013-02-21: qty 1

## 2013-02-21 MED ORDER — FUROSEMIDE 10 MG/ML IJ SOLN
160.0000 mg | Freq: Four times a day (QID) | INTRAVENOUS | Status: DC
  Administered 2013-02-21 – 2013-02-22 (×4): 160 mg via INTRAVENOUS
  Filled 2013-02-21 (×6): qty 16

## 2013-02-21 NOTE — Progress Notes (Signed)
Vein mapping still not done, hopefully today.  Most like access will be Wednesday 4/23  Fabienne Bruns, MD [facility] [place] Office: [phone] Pager: [phone]

## 2013-02-21 NOTE — Progress Notes (Signed)
Assessment:  1 Volume overload in setting of advanced CKD, responding to diuretics  2 Diabetes mellitus with complications  3 Morbid obesity with complications  Plan:  1Cont diuresis, increase dose  2 Dialysis education  Anticipate dialysis in near future; protect right arm from needle sticks. He is left handed.  3 Vein Mapping  4 VVS plans AV access Wed  Subjective: Interval History:Still DOE  Objective: Vital signs in last 24 hours: Temp:  [97.8 F (36.6 C)-98.2 F (36.8 C)] 98.2 F (36.8 C) (04/19 1019) Pulse Rate:  [63-86] 67 (04/19 1019) Resp:  [18-20] 19 (04/19 1019) BP: (139-179)/(50-66) 146/66 mmHg (04/19 1019) SpO2:  [92 %-97 %] 94 % (04/19 1019) Weight change:   Intake/Output from previous day: 04/18 0701 - 04/19 0700 In: -  Out: 2325 [Urine:2325] Intake/Output this shift: Total I/O In: 320 [P.O.:320] Out: -   General appearance: alert and cooperative GI: soft, non-tender; bowel sounds normal; no masses,  no organomegaly Extremities: edema 2+ EDEMA  Lab Results:  Recent Labs  02/20/13 0400 02/21/13 0537  WBC 10.9* 10.6*  HGB 8.7* 8.3*  HCT 27.3* 26.8*  PLT 240 238   BMET:  Recent Labs  02/20/13 0400  NA 136  K 3.8  CL 92*  CO2 34*  GLUCOSE 136*  BUN 91*  CREATININE 3.99*  CALCIUM 8.7   No results found for this basename: PTH,  in the last 72 hours Iron Studies: No results found for this basename: IRON, TIBC, TRANSFERRIN, FERRITIN,  in the last 72 hours Studies/Results: No results found.  Scheduled: . calcium acetate  667 mg Oral TID WC  . Chlorhexidine Gluconate Cloth  6 each Topical Q0600  . furosemide  100 mg Intravenous Q8H  . hydrALAZINE  50 mg Oral Q6H  . insulin aspart  0-15 Units Subcutaneous TID WC  . insulin glargine  24 Units Subcutaneous QHS  . isosorbide mononitrate  10 mg Oral BID  . metoprolol  50 mg Oral BID  . mupirocin ointment  1 application Nasal BID  . simvastatin  20 mg Oral QPM      LOS: 4 days    Kajsa Butrum C 02/21/2013,11:10 AM

## 2013-02-21 NOTE — Progress Notes (Signed)
Right  Upper Extremity Vein Map    Cephalic  Segment Diameter Depth Comment  1. Axilla 5.78mm mm   2. Mid upper arm 5.36mm mm   3. Above AC 4.67mm mm branch  4. In [facility] 4.58mm mm   5. Below AC 7.38mm mm   6. Mid forearm 4.84mm mm   7. Wrist 4.51mm mm    mm mm    mm mm    mm mm     Left Upper Extremity Vein Map    Cephalic  Segment Diameter Depth Comment  1. Axilla 3.76mm mm   2. Mid upper arm 4mm mm   3. Above AC 3.66mm mm branch  4. In [facility] 5.60mm mm   5. Below AC 4.23mm mm branch  6. Mid forearm 3.66mm mm   7. Wrist 3mm mm    mm mm    mm mm    mm mm

## 2013-02-21 NOTE — Progress Notes (Signed)
[facility] PROGRESS NOTE  ALMIR BOTTS RUE:[id number] DOB: 1956-09-08 DOA: 02/17/2013 PCP: No primary provider on file.  Assessment/Plan: Acute renal failure  presumed ATN - non oliguric with lasix - baseline crt felt to be 1.5 - appears to be progressing to long term HD  - Nephrology following.  - Will get Vein mapping and plans are for AV access Wednesday 02/25/13  Chronic hypercarbic respiratory failure  likely OHS - well compensated at this time   Acute resp acidosis  resolved   Severe hypervolemia with pulm edema  Improved - due to acute renal failure - net negative 14 L fluid balance at this time   Hematuria X 1 4/17 - again 4/18  related to foley cath - is recurring - stop heparin and ASA and follow - pt reports having this problem in the past related to use of foley cath   DM2  CBG well controlled at this time   CAD s/p CABG 2002  Asymptomatic at this time - having to hold ASA currently due to hematuria - resume asap. Likely next am if hematuria ceases.  Code Status: FULL  Family Communication: Spoke with patient and wife at bedside  Disposition Plan: tele bed    Code Status: full Family Communication: Discussed with patient and spouse Disposition Plan: Pending improvement in condition and specialists recommendations.   Consultants:  Vascular  Nephrology  Procedures:  Vein mapping   Antibiotics:  None  HPI/Subjective: Patient in no acute distress no new complaints. No acute issues overnight.  Objective: Filed Vitals:   02/21/13 0503 02/21/13 1019 02/21/13 1349 02/21/13 1427  BP: 147/50 146/66 204/64 146/56  Pulse: 65 67 65 65  Temp: 97.9 F (36.6 C) 98.2 F (36.8 C) 98.3 F (36.8 C)   TempSrc: Oral Oral Oral   Resp: 20 19 18    Height:      Weight:      SpO2: 97% 94% 94%     Intake/Output Summary (Last 24 hours) at 02/21/13 1508 Last data filed at 02/21/13 1300  Gross per 24 hour  Intake    600 ml  Output   2425 ml  Net  -1825  ml   Filed Weights   02/17/13 1020 02/19/13 0500  Weight: 180.078 kg (397 lb) 172.8 kg (380 lb 15.3 oz)    Exam:   General:  Pt in NAD, Alert and Awake  Cardiovascular: RRR, No MRG  Respiratory: CTA BL, no wheezes  Abdomen: soft, NT, ND  Musculoskeletal: no cyanosis or clubbing   Data Reviewed: Basic Metabolic Panel:  Recent Labs Lab 02/17/13 1130 02/18/13 0445 02/20/13 0400  NA 136 137 136  K 4.9 4.7 3.8  CL 96 96 92*  CO2 28 30 34*  GLUCOSE 77 125* 136*  BUN 84* 86* 91*  CREATININE 4.37* 4.35* 3.99*  CALCIUM 8.7 8.6 8.7  MG 2.5  --   --   PHOS 7.4*  --   --    Liver Function Tests:  Recent Labs Lab 02/17/13 1130 02/20/13 0400  AST 11 21  ALT 11 18  ALKPHOS 74 72  BILITOT 0.4 0.4  PROT 7.6 7.6  ALBUMIN 3.2* 3.2*   No results found for this basename: LIPASE, AMYLASE,  in the last 168 hours No results found for this basename: AMMONIA,  in the last 168 hours CBC:  Recent Labs Lab 02/17/13 1130 02/18/13 0445 02/19/13 0912 02/20/13 0400 02/21/13 0537  WBC 11.1* 11.4* 8.7 10.9* 10.6*  NEUTROABS 9.1*  --   --   --   --  HGB 9.2* 9.2* 9.1* 8.7* 8.3*  HCT 30.3* 29.4* 28.7* 27.3* 26.8*  MCV 81.2 81.2 79.3 79.8 79.3  PLT 202 230 223 240 238   Cardiac Enzymes: No results found for this basename: CKTOTAL, CKMB, CKMBINDEX, TROPONINI,  in the last 168 hours BNP (last 3 results) No results found for this basename: PROBNP,  in the last 8760 hours CBG:  Recent Labs Lab 02/20/13 1152 02/20/13 1640 02/20/13 2151 02/21/13 0756 02/21/13 1121  GLUCAP 148* 158* 259* 116* 171*    Recent Results (from the past 240 hour(s))  MRSA PCR SCREENING     Status: Abnormal   Collection Time    02/17/13 10:23 AM      Result Value Range Status   MRSA by PCR POSITIVE (*) NEGATIVE Final   Comment:            The GeneXpert MRSA Assay (FDA     approved for NASAL specimens     only), is one component of a     comprehensive MRSA colonization     surveillance  program. It is not     intended to diagnose MRSA     infection nor to guide or     monitor treatment for     MRSA infections.     RESULT CALLED TO, READ BACK BY AND VERIFIED WITH:     C. MCKEOWN RN 12:40 02/17/13 ([email or web address])     Studies: No results found.  Scheduled Meds: . calcium acetate  667 mg Oral TID WC  . Chlorhexidine Gluconate Cloth  6 each Topical Q0600  . furosemide  160 mg Intravenous Q6H  . hydrALAZINE  50 mg Oral Q6H  . insulin aspart  0-15 Units Subcutaneous TID WC  . insulin glargine  24 Units Subcutaneous QHS  . isosorbide mononitrate  10 mg Oral BID  . metoprolol  50 mg Oral BID  . mupirocin ointment  1 application Nasal BID  . simvastatin  20 mg Oral QPM   Continuous Infusions:   Active Problems:   Acute renal failure   Acute respiratory failure   Hypervolemia    Time spent: > 35 minutes    [name]  [facility] Pager [phone]. If 7PM-7AM, please contact night-coverage at [email or web address], password [facility] 02/21/2013, 3:08 PM  LOS: 4 days

## 2013-02-22 DIAGNOSIS — E876 Hypokalemia: Secondary | ICD-10-CM

## 2013-02-22 LAB — RENAL FUNCTION PANEL
Albumin: 3.1 g/dL — ABNORMAL LOW (ref 3.5–5.2)
Calcium: 8.6 mg/dL (ref 8.4–10.5)
Creatinine, Ser: 3.62 mg/dL — ABNORMAL HIGH (ref 0.50–1.35)
GFR calc non Af Amer: 17 mL/min — ABNORMAL LOW (ref >90)
Phosphorus: 4.8 mg/dL — ABNORMAL HIGH (ref 2.3–4.6)

## 2013-02-22 LAB — GLUCOSE, CAPILLARY: Glucose-Capillary: 128 mg/dL — ABNORMAL HIGH (ref 70–99)

## 2013-02-22 MED ORDER — FUROSEMIDE 80 MG PO TABS
240.0000 mg | ORAL_TABLET | Freq: Two times a day (BID) | ORAL | Status: DC
  Administered 2013-02-22 – 2013-02-23 (×2): 240 mg via ORAL
  Filled 2013-02-22 (×4): qty 3

## 2013-02-22 MED ORDER — POTASSIUM CHLORIDE CRYS ER 20 MEQ PO TBCR
EXTENDED_RELEASE_TABLET | ORAL | Status: AC
  Administered 2013-02-22: 40 meq
  Filled 2013-02-22: qty 2

## 2013-02-22 MED ORDER — POTASSIUM CHLORIDE CRYS ER 20 MEQ PO TBCR: 40.0000 meq | EXTENDED_RELEASE_TABLET | Freq: Once | ORAL | Status: AC

## 2013-02-22 NOTE — Consult Note (Signed)
Vein mapping reviewed.  Should be candidate for right radial cephalic avf. Vein 4-6 mm  Filed Vitals:   02/21/13 1427 02/21/13 1849 02/21/13 2255 02/22/13 0515  BP: 146/56 144/61 161/60 142/45  Pulse: 65 71 73 68  Temp:  98.3 F (36.8 C) 98.8 F (37.1 C) 98 F (36.7 C)  TempSrc:  Oral Oral Oral  Resp:  18 18 20   Height:      Weight:      SpO2:  94% 96% 95%   2+ right and left radial pulse  Right radial cephalic AVF Wednesday 02/25/13  Fabienne Bruns, MD [facility] Office: [phone] Pager: [phone]

## 2013-02-22 NOTE — Progress Notes (Signed)
Assessment:  1 Volume overload in setting of advanced diabetic CKD, responding to diuretics  2 Diabetes mellitus with complications  3 Morbid obesity with complications  4 Anemia   Plan:  1 Cont diuresis, change to PO furosemide 2 VVS plans AVF access Wed 3 D/C oxygen 4 IJ cath can be removed anytime 5 I have no objection to out pt AV access surgery if pt able to diurese with PO furosemide and is OK off oxygen 6 May need erythropoetin as OP  Subjective: Interval History: For AVF Wed.  He wants to go home and do outpt. surgery  Objective: Vital signs in last 24 hours: Temp:  [98 F (36.7 C)-98.8 F (37.1 C)] 98 F (36.7 C) (04/20 1006) Pulse Rate:  [65-73] 72 (04/20 1006) Resp:  [18-20] 19 (04/20 1006) BP: (133-161)/(45-67) 133/67 mmHg (04/20 1006) SpO2:  [89 %-96 %] 89 % (04/20 1006) Weight change:   Intake/Output from previous day: 04/19 0701 - 04/20 0700 In: 600 [P.O.:600] Out: 3145 [Urine:3145] Intake/Output this shift: Total I/O In: -  Out: 850 [Urine:850]  General appearance: alert, cooperative and morbidly obese GI: soft, non-tender; bowel sounds normal; no masses,  no organomegaly Extremities: edema decreasing significantly  Lab Results:  Recent Labs  02/20/13 0400 02/21/13 0537  WBC 10.9* 10.6*  HGB 8.7* 8.3*  HCT 27.3* 26.8*  PLT 240 238   BMET:  Recent Labs  02/20/13 0400 02/22/13 0500  NA 136 136  K 3.8 3.3*  CL 92* 90*  CO2 34* 38*  GLUCOSE 136* 144*  BUN 91* 91*  CREATININE 3.99* 3.62*  CALCIUM 8.7 8.6   No results found for this basename: PTH,  in the last 72 hours Iron Studies: No results found for this basename: IRON, TIBC, TRANSFERRIN, FERRITIN,  in the last 72 hours Studies/Results: No results found.  Scheduled: . calcium acetate  667 mg Oral TID WC  . furosemide  240 mg Oral BID  . hydrALAZINE  50 mg Oral Q6H  . insulin aspart  0-15 Units Subcutaneous TID WC  . insulin glargine  24 Units Subcutaneous QHS  . isosorbide  mononitrate  10 mg Oral BID  . metoprolol  50 mg Oral BID  . mupirocin ointment  1 application Nasal BID  . simvastatin  20 mg Oral QPM     LOS: 5 days   Shaely Gadberry C 02/22/2013,2:13 PM

## 2013-02-22 NOTE — Progress Notes (Signed)
[facility] PROGRESS NOTE  Timothy Banks WGN:[id number] DOB: 1956-03-01 DOA: 02/17/2013 PCP: No primary provider on file.  Assessment/Plan: Acute renal failure  presumed ATN - non oliguric with lasix - baseline crt felt to be 1.5 - appears to be progressing to long term HD  - Nephrology following. His furosemide will be changed to oral regimen and we are to see if patient diureses on this regimen. Also will wean to room air. - Agree with removing IJ - Vein mapping complete. Plans are for AV access Wednesday 02/25/13. Vascular Surgeon to reassess this wednesday  Chronic hypercarbic respiratory failure  likely OHS - well compensated at this time   Acute resp acidosis  resolved   Severe hypervolemia with pulm edema  Improved - due to acute renal failure - net negative 14 L fluid balance at this time   Hematuria X 1 4/17 - again 4/18  related to foley cath - is recurring - stop heparin and ASA and follow - pt reports having this problem in the past related to use of foley cath   - has resolved. Will recheck hemoglobin next am.  DM2  CBG well controlled at this time   CAD s/p CABG 2002  Asymptomatic at this time - having to hold ASA currently due to hematuria - resume asap.  - If no bleeding reported next am will plan on restarting aspirin.  Code Status: FULL  Family Communication: Spoke with patient and wife at bedside  Disposition Plan: tele bed    Code Status: full Family Communication: Discussed with patient and spouse Disposition Plan: Pending improvement in condition and specialists recommendations.   Consultants:  Vascular  Nephrology  Procedures:  Vein mapping   Antibiotics:  None  HPI/Subjective: Patient in no acute distress no new complaints. No acute issues overnight.  Is inquiring about discharge and would like to go home as soon as possible.  Objective: Filed Vitals:   02/21/13 2255 02/22/13 0515 02/22/13 1006 02/22/13 1428  BP: 161/60 142/45  133/67 149/69  Pulse: 73 68 72 70  Temp: 98.8 F (37.1 C) 98 F (36.7 C) 98 F (36.7 C) 98.7 F (37.1 C)  TempSrc: Oral Oral Oral Oral  Resp: 18 20 19 18   Height:      Weight:      SpO2: 96% 95% 89% 89%    Intake/Output Summary (Last 24 hours) at 02/22/13 1440 Last data filed at 02/22/13 0809  Gross per 24 hour  Intake      0 ml  Output   3245 ml  Net  -3245 ml   Filed Weights   02/17/13 1020 02/19/13 0500  Weight: 180.078 kg (397 lb) 172.8 kg (380 lb 15.3 oz)    Exam:   General:  Pt in NAD, Alert and Awake  Cardiovascular: RRR, No MRG  Respiratory: CTA BL, no wheezes  Abdomen: soft, NT, ND  Musculoskeletal: no cyanosis or clubbing   Data Reviewed: Basic Metabolic Panel:  Recent Labs Lab 02/17/13 1130 02/18/13 0445 02/20/13 0400 02/22/13 0500  NA 136 137 136 136  K 4.9 4.7 3.8 3.3*  CL 96 96 92* 90*  CO2 28 30 34* 38*  GLUCOSE 77 125* 136* 144*  BUN 84* 86* 91* 91*  CREATININE 4.37* 4.35* 3.99* 3.62*  CALCIUM 8.7 8.6 8.7 8.6  MG 2.5  --   --   --   PHOS 7.4*  --   --  4.8*   Liver Function Tests:  Recent Labs Lab 02/17/13  1130 02/20/13 0400 02/22/13 0500  AST 11 21  --   ALT 11 18  --   ALKPHOS 74 72  --   BILITOT 0.4 0.4  --   PROT 7.6 7.6  --   ALBUMIN 3.2* 3.2* 3.1*   No results found for this basename: LIPASE, AMYLASE,  in the last 168 hours No results found for this basename: AMMONIA,  in the last 168 hours CBC:  Recent Labs Lab 02/17/13 1130 02/18/13 0445 02/19/13 0912 02/20/13 0400 02/21/13 0537  WBC 11.1* 11.4* 8.7 10.9* 10.6*  NEUTROABS 9.1*  --   --   --   --   HGB 9.2* 9.2* 9.1* 8.7* 8.3*  HCT 30.3* 29.4* 28.7* 27.3* 26.8*  MCV 81.2 81.2 79.3 79.8 79.3  PLT 202 230 223 240 238   Cardiac Enzymes: No results found for this basename: CKTOTAL, CKMB, CKMBINDEX, TROPONINI,  in the last 168 hours BNP (last 3 results) No results found for this basename: PROBNP,  in the last 8760 hours CBG:  Recent Labs Lab  02/21/13 1121 02/21/13 1719 02/21/13 2239 02/22/13 0800 02/22/13 1156  GLUCAP 171* 237* 157* 128* 240*    Recent Results (from the past 240 hour(s))  MRSA PCR SCREENING     Status: Abnormal   Collection Time    02/17/13 10:23 AM      Result Value Range Status   MRSA by PCR POSITIVE (*) NEGATIVE Final   Comment:            The GeneXpert MRSA Assay (FDA     approved for NASAL specimens     only), is one component of a     comprehensive MRSA colonization     surveillance program. It is not     intended to diagnose MRSA     infection nor to guide or     monitor treatment for     MRSA infections.     RESULT CALLED TO, READ BACK BY AND VERIFIED WITH:     C. MCKEOWN RN 12:40 02/17/13 ([email or web address])     Studies: No results found.  Scheduled Meds: . calcium acetate  667 mg Oral TID WC  . furosemide  240 mg Oral BID  . hydrALAZINE  50 mg Oral Q6H  . insulin aspart  0-15 Units Subcutaneous TID WC  . insulin glargine  24 Units Subcutaneous QHS  . isosorbide mononitrate  10 mg Oral BID  . metoprolol  50 mg Oral BID  . mupirocin ointment  1 application Nasal BID  . simvastatin  20 mg Oral QPM   Continuous Infusions:   Active Problems:   Acute renal failure   Acute respiratory failure   Hypervolemia   Hypokalemia    Time spent: > 35 minutes    [name]  [facility] Pager [phone]. If 7PM-7AM, please contact night-coverage at [email or web address], password [facility] 02/22/2013, 2:40 PM  LOS: 5 days

## 2013-02-23 DIAGNOSIS — E118 Type 2 diabetes mellitus with unspecified complications: Secondary | ICD-10-CM

## 2013-02-23 DIAGNOSIS — IMO0002 Reserved for concepts with insufficient information to code with codable children: Secondary | ICD-10-CM

## 2013-02-23 DIAGNOSIS — E1165 Type 2 diabetes mellitus with hyperglycemia: Secondary | ICD-10-CM

## 2013-02-23 LAB — RENAL FUNCTION PANEL
GFR calc Af Amer: 22 mL/min — ABNORMAL LOW (ref >90)
Glucose, Bld: 181 mg/dL — ABNORMAL HIGH (ref 70–99)
Phosphorus: 3.9 mg/dL (ref 2.3–4.6)
Potassium: 3.5 mEq/L (ref 3.5–5.1)
Sodium: 134 mEq/L — ABNORMAL LOW (ref 135–145)

## 2013-02-23 LAB — CBC
HCT: 26.2 % — ABNORMAL LOW (ref 39.0–52.0)
MCV: 80.9 fL (ref 78.0–100.0)
Platelets: 236 10*3/uL (ref 150–400)
RBC: 3.24 MIL/uL — ABNORMAL LOW (ref 4.22–5.81)
WBC: 11.2 10*3/uL — ABNORMAL HIGH (ref 4.0–10.5)

## 2013-02-23 LAB — IRON AND TIBC
Saturation Ratios: 8 % — ABNORMAL LOW (ref 20–55)
TIBC: 249 ug/dL (ref 215–435)
UIBC: 228 ug/dL (ref 125–400)

## 2013-02-23 LAB — GLUCOSE, CAPILLARY
Glucose-Capillary: 153 mg/dL — ABNORMAL HIGH (ref 70–99)
Glucose-Capillary: 171 mg/dL — ABNORMAL HIGH (ref 70–99)
Glucose-Capillary: 205 mg/dL — ABNORMAL HIGH (ref 70–99)

## 2013-02-23 MED ORDER — METOPROLOL TARTRATE 50 MG PO TABS: 50.0000 mg | ORAL_TABLET | Freq: Two times a day (BID) | ORAL | Status: DC

## 2013-02-23 MED ORDER — AMLODIPINE BESYLATE 5 MG PO TABS
5.0000 mg | ORAL_TABLET | Freq: Every day | ORAL | Status: DC
  Filled 2013-02-23: qty 1

## 2013-02-23 MED ORDER — POTASSIUM CHLORIDE CRYS ER 20 MEQ PO TBCR: 40.0000 meq | EXTENDED_RELEASE_TABLET | Freq: Two times a day (BID) | ORAL | Status: DC

## 2013-02-23 MED ORDER — POTASSIUM CHLORIDE CRYS ER 20 MEQ PO TBCR: 40.0000 meq | EXTENDED_RELEASE_TABLET | Freq: Every day | ORAL | Status: DC

## 2013-02-23 MED ORDER — FUROSEMIDE 80 MG PO TABS
160.0000 mg | ORAL_TABLET | Freq: Two times a day (BID) | ORAL | Status: DC
  Filled 2013-02-23 (×2): qty 2

## 2013-02-23 MED ORDER — FUROSEMIDE 80 MG PO TABS: 160.0000 mg | ORAL_TABLET | Freq: Two times a day (BID) | ORAL | Status: DC

## 2013-02-23 MED ORDER — AMLODIPINE BESYLATE 5 MG PO TABS: 5.0000 mg | ORAL_TABLET | Freq: Every day | ORAL | Status: DC

## 2013-02-23 NOTE — Progress Notes (Signed)
Late entry:  Provided patient with discharge instructions.  Patient was able to verbalize understanding of all instructions.  This RN reminded patient to call his primary care physician for follow up in one week.  Also reminded patient to follow up with Dr. Lowell Guitar and Dr. Darrick Penna.  Patient's surgery is scheduled for Wednesday at 8:30 am. This RN advised the patient to call Dr. Darrick Penna' office tomorrow to ask when he needs to arrive.  Patient was given phone numbers for contacting the above physicians.  Patient's vitals are stable at discharge.  Right IJ hemodialysis catheter was removed prior to discharge per order.  Dressing is clean, dry and intact at time of discharge.

## 2013-02-23 NOTE — Progress Notes (Signed)
CRITICAL VALUE ALERT  Critical value received:  CO2 40  Date of notification:  02/23/13  Time of notification:  0718  Critical value read back:yes  Nurse who received alert:  Lavell Anchors, RN  MD notified (1st page):  Cena Benton  Time of first page:  0719  MD notified (2nd page):  Time of second page:  Responding MD:  Cena Benton  Time MD responded:  [phone]

## 2013-02-23 NOTE — Discharge Summary (Signed)
Physician Discharge Summary  Timothy Banks ZOX:[id number] DOB: Aug 11, 1956 DOA: 02/17/2013  PCP: No primary provider on file.  Admit date: 02/17/2013 Discharge date: 02/23/2013  Time spent: > 35 minutes  Recommendations for Outpatient Follow-up:  1. Please be sure to follow up on patient's potassium levels 2. Also follow up volume status 3. Will need his plavix and aspirin started after surgical procedure when surgery clears him to start it back up. 4. Also metformin contraindicated given his creatinine.  He will need to have his blood sugars evaluated to see if he will need escalation of his hypoglycemic agents.  Discharge Diagnoses:  Active Problems:   Acute renal failure   Acute respiratory failure   Hypervolemia   Hypokalemia   Discharge Condition: stable  Diet recommendation: renal/diabetic  Filed Weights   02/17/13 1020 02/19/13 0500  Weight: 180.078 kg (397 lb) 172.8 kg (380 lb 15.3 oz)    History of present illness:  From original HPI: 57 y/o morbidly obese male transferred 4/15 from [name] for acute renal failure. Admitted 4/11 with worsening shortness of breath that has greatly limited his mobility. He has multiple medical problems including DM, CHF, prior MI, , CAD, and CRF.  Baseline cr 1.5 in 2011, 3.1 on adm - diuresed with lasix gtt, on 4/14 diuresed 1.1 L with lasix 160 & zaroxlyn 10  ABg s/o resp acidosis on 4/15 - 7.31 /56   Hospital Course:  Acute renal failure  presumed ATN - non oliguric with lasix - baseline crt felt to be 1.5 - appears to be progressing to long term HD  - Nephrology following. His furosemide will be changed to oral regimen. Still hypoxic on room air. Therefore will d/c on home oxygen. - Agree with removing IJ  - Vein mapping complete. Plans are for AV access Wednesday 02/25/13. Vascular Surgeon to reassess this Wednesday. Patient requesting to follow up as outpatient.   Chronic hypercarbic respiratory failure  likely OHS - well  compensated at this time   Acute resp acidosis  resolved   Severe hypervolemia with pulm edema  Improved - due to acute renal failure - net negative 20 L fluid balance at this time   Hematuria X 1 4/17 - again 4/18  related to foley cath - is recurring - stop heparin and ASA and follow - pt reports having this problem in the past related to use of foley cath  - has resolved. Hemoglobin steady and no more reports of hematuria.  DM2  Discharge on Glipizide with plans for patient to f/u with pcp to further assess and adjust his hypoglycemic agents. - Metformin contraindicated given creatinine level.  CAD s/p CABG 2002  Asymptomatic at this time - having to hold ASA currently due to hematuria - resume asap.  - Given plan for procedure this Wednesday 02/25/13 by vascular and recent hematuria, will hold off on anticoagulation agents.  Patient will need to be placed back on his agents once cleared by vascular surgery.  Procedures:  Upper extremity vein mapping BL  Bipap  IJ insertion.  Consultations:  Nephrology: Dr. Lowell Guitar  Vascular surgery: Dr. Darrick Penna  Discharge Exam: Filed Vitals:   02/22/13 2042 02/23/13 0604 02/23/13 1000 02/23/13 1333  BP: 154/57 130/47 157/69 142/70  Pulse: 71 62 65 60  Temp: 98.1 F (36.7 C) 97.7 F (36.5 C) 98 F (36.7 C) 97.7 F (36.5 C)  TempSrc: Oral Oral Oral Oral  Resp: 18 20 20 20   Height:      Weight:  SpO2: 97% 98% 98% 98%    General: Pt in NAD, Alert and Awake Cardiovascular: RRR, NO MRG Respiratory: Rhales at bases L > R, no wheezes  Discharge Instructions  Discharge Orders   Future Orders Complete By Expires     Call MD for:  difficulty breathing, headache or visual disturbances  As directed     Call MD for:  extreme fatigue  As directed     Call MD for:  persistant dizziness or light-headedness  As directed     Call MD for:  persistant nausea and vomiting  As directed     Call MD for:  severe uncontrolled pain  As  directed     Call MD for:  temperature >100.4  As directed     Diet - low sodium heart healthy  As directed     Discharge instructions  As directed     Comments:      Please be sure to follow up with PCP in 1 week to reassess your blood sugars and decide when to continue your plavix and or aspirin.  Will hold your plavix and aspirin as you have surgery on Wednesday and will defer to when surgeon feels you can restart your anticoagulation medication.    Increase activity slowly  As directed         Medication List    STOP taking these medications       aspirin 325 MG EC tablet     clopidogrel 75 MG tablet  Commonly known as:  PLAVIX     gabapentin 400 MG capsule  Commonly known as:  NEURONTIN     HYDROcodone-acetaminophen 10-325 MG per tablet  Commonly known as:  NORCO     losartan 100 MG tablet  Commonly known as:  COZAAR     metFORMIN 1000 MG tablet  Commonly known as:  GLUCOPHAGE     torsemide 20 MG tablet  Commonly known as:  DEMADEX      TAKE these medications       albuterol 108 (90 BASE) MCG/ACT inhaler  Commonly known as:  PROVENTIL HFA;VENTOLIN HFA  Inhale 2 puffs into the lungs every 6 (six) hours as needed for wheezing or shortness of breath.     amLODipine 5 MG tablet  Commonly known as:  NORVASC  Take 1 tablet (5 mg total) by mouth at bedtime.     furosemide 80 MG tablet  Commonly known as:  LASIX  Take 2 tablets (160 mg total) by mouth 2 (two) times daily.     glipiZIDE 10 MG tablet  Commonly known as:  GLUCOTROL  Take 10 mg by mouth 2 (two) times daily before a meal.     isosorbide mononitrate 30 MG 24 hr tablet  Commonly known as:  IMDUR  Take 30 mg by mouth daily.     metoprolol 50 MG tablet  Commonly known as:  LOPRESSOR  Take 1 tablet (50 mg total) by mouth 2 (two) times daily.     nitroGLYCERIN 0.4 MG SL tablet  Commonly known as:  NITROSTAT  Place 0.4 mg under the tongue every 5 (five) minutes as needed for chest pain.      oxyCODONE-acetaminophen 10-325 MG per tablet  Commonly known as:  PERCOCET  Take 1 tablet by mouth every 8 (eight) hours as needed.     potassium chloride SA 20 MEQ tablet  Commonly known as:  K-DUR,KLOR-CON  Take 2 tablets (40 mEq total) by mouth daily.  simvastatin 20 MG tablet  Commonly known as:  ZOCOR  Take 20 mg by mouth every evening.          The results of significant diagnostics from this hospitalization (including imaging, microbiology, ancillary and laboratory) are listed below for reference.    Significant Diagnostic Studies: US Renal Port  02/17/2013  *RADIOLOGY REPORT*  Clinical Data: 57 year old male with acute renal failure.  RENAL/URINARY TRACT ULTRASOUND COMPLETE  Comparison:  12/08/2012. CT abdomen and pelvis 07/18/2011.  Findings:  Right Kidney:  No hydronephrosis.  Renal length approximately 10.0 cm.  Cortical echotexture at the upper limits of normal.  Left Kidney:  Difficult to visualize.  No hydronephrosis.  Stable renal cortical thickness.  Echotexture appears mildly increased.  Bladder:  Not visible.  IMPRESSION: No hydronephrosis.  Ventilation in the left kidney limited by large body habitus. Consider chronic medical renal disease.   Original Report Authenticated By: Erskine Speed, M.D.    Dg Chest Port 1 View  02/19/2013  *RADIOLOGY REPORT*  Clinical Data:  Pulmonary edema  PORTABLE CHEST - 1 VIEW  Comparison: Portable exam 0608 hours compared to 02/17/2013  Findings: Right jugular central venous catheter with tip projecting over SVC. Enlargement of cardiac silhouette post CABG. Pulmonary vascular congestion. Decreased lung volumes. Accentuation of perihilar markings likely represents mild pulmonary edema. No segmental consolidation, pleural effusion, or pneumothorax.  IMPRESSION: Probable mild CHF.   Original Report Authenticated By: Ulyses Southward, M.D.    Dg Chest Port 1 View  02/17/2013  *RADIOLOGY REPORT*  Clinical Data: New right internal jugular  hemodialysis catheter  PORTABLE CHEST - 1 VIEW  Comparison: Portable exam 1259 hours compared to 02/17/2013  Findings: New right IJ catheter tip projecting over SVC at the level of the aortic arch. Enlargement of cardiac silhouette post CABG. Pulmonary vascular congestion. Perihilar infiltrate likely mild edema. No gross pleural effusion or pneumothorax.  IMPRESSION: No pneumothorax following right jugular line placement. Probable mild pulmonary edema.   Original Report Authenticated By: Ulyses Southward, M.D.     Microbiology: Recent Results (from the past 240 hour(s))  MRSA PCR SCREENING     Status: Abnormal   Collection Time    02/17/13 10:23 AM      Result Value Range Status   MRSA by PCR POSITIVE (*) NEGATIVE Final   Comment:            The GeneXpert MRSA Assay (FDA     approved for NASAL specimens     only), is one component of a     comprehensive MRSA colonization     surveillance program. It is not     intended to diagnose MRSA     infection nor to guide or     monitor treatment for     MRSA infections.     RESULT CALLED TO, READ BACK BY AND VERIFIED WITH:     C. MCKEOWN RN 12:40 02/17/13 ([email or web address])     Labs: Basic Metabolic Panel:  Recent Labs Lab 02/17/13 1130 02/18/13 0445 02/20/13 0400 02/22/13 0500 02/23/13 0500  NA 136 137 136 136 134*  K 4.9 4.7 3.8 3.3* 3.5  CL 96 96 92* 90* 88*  CO2 28 30 34* 38* 40*  GLUCOSE 77 125* 136* 144* 181*  BUN 84* 86* 91* 91* 90*  CREATININE 4.37* 4.35* 3.99* 3.62* 3.37*  CALCIUM 8.7 8.6 8.7 8.6 8.8  MG 2.5  --   --   --  2.1  PHOS 7.4*  --   --  4.8* 3.9   Liver Function Tests:  Recent Labs Lab 02/17/13 1130 02/20/13 0400 02/22/13 0500 02/23/13 0500  AST 11 21  --   --   ALT 11 18  --   --   ALKPHOS 74 72  --   --   BILITOT 0.4 0.4  --   --   PROT 7.6 7.6  --   --   ALBUMIN 3.2* 3.2* 3.1* 3.3*   No results found for this basename: LIPASE, AMYLASE,  in the last 168 hours No results found for this basename: AMMONIA,  in  the last 168 hours CBC:  Recent Labs Lab 02/17/13 1130 02/18/13 0445 02/19/13 0912 02/20/13 0400 02/21/13 0537 02/23/13 0500  WBC 11.1* 11.4* 8.7 10.9* 10.6* 11.2*  NEUTROABS 9.1*  --   --   --   --   --   HGB 9.2* 9.2* 9.1* 8.7* 8.3* 8.1*  HCT 30.3* 29.4* 28.7* 27.3* 26.8* 26.2*  MCV 81.2 81.2 79.3 79.8 79.3 80.9  PLT 202 230 223 240 238 236   Cardiac Enzymes: No results found for this basename: CKTOTAL, CKMB, CKMBINDEX, TROPONINI,  in the last 168 hours BNP: BNP (last 3 results) No results found for this basename: PROBNP,  in the last 8760 hours CBG:  Recent Labs Lab 02/22/13 1156 02/22/13 1658 02/22/13 2146 02/23/13 0753 02/23/13 1134  GLUCAP 240* 225* 171* 153* 171*       Signed:  [name]  [facility] 02/23/2013, 1:51 PM

## 2013-02-23 NOTE — Progress Notes (Signed)
SATURATION QUALIFICATIONS: (This note is used to comply with regulatory documentation for home oxygen)  Patient Saturations on Room Air at Rest = 89%  Patient Saturations on Room Air while Ambulating = 81%  Patient Saturations on 2 Liters of oxygen while Ambulating = 89%  With 2 liters of oxygen at rest 94 %  Please briefly explain why patient needs home oxygen:

## 2013-02-23 NOTE — Progress Notes (Signed)
Subjective: Interval History: has complaints to go home and come back Wed for access.  Objective: Vital signs in last 24 hours: Temp:  [97.7 F (36.5 C)-98.7 F (37.1 C)] 98 F (36.7 C) (04/21 1000) Pulse Rate:  [62-71] 65 (04/21 1000) Resp:  [18-20] 20 (04/21 1000) BP: (130-157)/(47-69) 157/69 mmHg (04/21 1000) SpO2:  [89 %-98 %] 98 % (04/21 1000) Weight change:   Intake/Output from previous day: 04/20 0701 - 04/21 0700 In: -  Out: 3225 [Urine:3225] Intake/Output this shift: Total I/O In: -  Out: 420 [Urine:420]  General appearance: alert, cooperative and morbidly obese Neck: RIJ cath Resp: diminished breath sounds bilaterally Cardio: S1, S2 normal and systolic murmur: holosystolic 2/6, blowing at apex GI: massive obesity, pos bs, liver down 6 cm Extremities: edema 2+  Lab Results:  Recent Labs  02/21/13 0537 02/23/13 0500  WBC 10.6* 11.2*  HGB 8.3* 8.1*  HCT 26.8* 26.2*  PLT 238 236   BMET:  Recent Labs  02/22/13 0500 02/23/13 0500  NA 136 134*  K 3.3* 3.5  CL 90* 88*  CO2 38* 40*  GLUCOSE 144* 181*  BUN 91* 90*  CREATININE 3.62* 3.37*  CALCIUM 8.6 8.8   No results found for this basename: PTH,  in the last 72 hours Iron Studies: No results found for this basename: IRON, TIBC, TRANSFERRIN, FERRITIN,  in the last 72 hours  Studies/Results: No results found.  I have reviewed the patient's current medications.  Assessment/Plan: 1 CKD/AKI improving, diuresing, can slow, will give KCl with alkalosis.  Ok to D/C and F/U labs OV with Dr. Lowell Guitar. 2 Anemia will address 3 CHF better 4 HPTH 5 Massive obesity 6 ?OSA P lower Lasix, give KCl, schedule F/U    LOS: 6 days   Ricca Melgarejo L 02/23/2013,10:25 AM

## 2013-02-24 ENCOUNTER — Other Ambulatory Visit: Payer: Self-pay | Admitting: *Deleted

## 2013-02-25 ENCOUNTER — Encounter (HOSPITAL_COMMUNITY): Payer: Self-pay | Admitting: Anesthesiology

## 2013-02-25 ENCOUNTER — Telehealth: Payer: Self-pay | Admitting: Vascular Surgery

## 2013-02-25 ENCOUNTER — Ambulatory Visit (HOSPITAL_COMMUNITY)
Admission: RE | Admit: 2013-02-25 | Discharge: 2013-02-25 | Disposition: A | Discharge status: Home / Self Care | Payer: Medicare Other | Source: Ambulatory Visit | Attending: Vascular Surgery | Admitting: Vascular Surgery

## 2013-02-25 ENCOUNTER — Ambulatory Visit (HOSPITAL_COMMUNITY): Payer: Medicare Other | Admitting: Anesthesiology

## 2013-02-25 ENCOUNTER — Encounter (HOSPITAL_COMMUNITY)
Admission: RE | Disposition: A | Discharge status: Home / Self Care | Payer: Self-pay | Source: Ambulatory Visit | Attending: Vascular Surgery

## 2013-02-25 ENCOUNTER — Encounter (HOSPITAL_COMMUNITY): Payer: Self-pay | Admitting: *Deleted

## 2013-02-25 DIAGNOSIS — N186 End stage renal disease: Secondary | ICD-10-CM

## 2013-02-25 DIAGNOSIS — I120 Hypertensive chronic kidney disease with stage 5 chronic kidney disease or end stage renal disease: Secondary | ICD-10-CM | POA: Insufficient documentation

## 2013-02-25 DIAGNOSIS — N179 Acute kidney failure, unspecified: Secondary | ICD-10-CM

## 2013-02-25 DIAGNOSIS — I509 Heart failure, unspecified: Secondary | ICD-10-CM | POA: Insufficient documentation

## 2013-02-25 DIAGNOSIS — E119 Type 2 diabetes mellitus without complications: Secondary | ICD-10-CM | POA: Insufficient documentation

## 2013-02-25 HISTORY — PX: AV FISTULA PLACEMENT: SHX1204

## 2013-02-25 LAB — POCT I-STAT 4, (NA,K, GLUC, HGB,HCT)
HCT: 28 % — ABNORMAL LOW (ref 39.0–52.0)
Hemoglobin: 9.5 g/dL — ABNORMAL LOW (ref 13.0–17.0)
Potassium: 3.8 mEq/L (ref 3.5–5.1)
Sodium: 137 mEq/L (ref 135–145)

## 2013-02-25 LAB — GLUCOSE, CAPILLARY: Glucose-Capillary: 142 mg/dL — ABNORMAL HIGH (ref 70–99)

## 2013-02-25 SURGERY — ARTERIOVENOUS (AV) FISTULA CREATION
Anesthesia: General | Site: Arm Lower | Laterality: Right | Wound class: Clean

## 2013-02-25 MED ORDER — DEXTROSE 5 % IV SOLN
1.5000 g | INTRAVENOUS | Status: AC
  Administered 2013-02-25: 1.5 g via INTRAVENOUS
  Filled 2013-02-25: qty 1.5

## 2013-02-25 MED ORDER — ONDANSETRON HCL 4 MG/2ML IJ SOLN: 4.0000 mg | Freq: Four times a day (QID) | INTRAMUSCULAR | Status: DC | PRN

## 2013-02-25 MED ORDER — MUPIROCIN 2 % EX OINT
TOPICAL_OINTMENT | CUTANEOUS | Status: AC
  Filled 2013-02-25: qty 22

## 2013-02-25 MED ORDER — SODIUM CHLORIDE 0.9 % IV SOLN: INTRAVENOUS | Status: DC

## 2013-02-25 MED ORDER — FENTANYL CITRATE 0.05 MG/ML IJ SOLN: 25.0000 ug | INTRAMUSCULAR | Status: DC | PRN

## 2013-02-25 MED ORDER — FENTANYL CITRATE 0.05 MG/ML IJ SOLN
INTRAMUSCULAR | Status: DC | PRN
  Administered 2013-02-25: 50 ug via INTRAVENOUS

## 2013-02-25 MED ORDER — ONDANSETRON HCL 4 MG/2ML IJ SOLN
INTRAMUSCULAR | Status: DC | PRN
  Administered 2013-02-25: 4 mg via INTRAVENOUS

## 2013-02-25 MED ORDER — HEPARIN SODIUM (PORCINE) 1000 UNIT/ML IJ SOLN
INTRAMUSCULAR | Status: DC | PRN
  Administered 2013-02-25: 5000 [IU] via INTRAVENOUS

## 2013-02-25 MED ORDER — METOPROLOL TARTRATE 50 MG PO TABS: 50.0000 mg | ORAL_TABLET | Freq: Once | ORAL | Status: AC

## 2013-02-25 MED ORDER — OXYCODONE HCL 5 MG/5ML PO SOLN: 5.0000 mg | Freq: Once | ORAL | Status: DC | PRN

## 2013-02-25 MED ORDER — METOPROLOL TARTRATE 50 MG PO TABS
ORAL_TABLET | ORAL | Status: AC
  Administered 2013-02-25: 50 mg via ORAL
  Filled 2013-02-25: qty 1

## 2013-02-25 MED ORDER — PROPOFOL 10 MG/ML IV BOLUS
INTRAVENOUS | Status: DC | PRN
  Administered 2013-02-25: 200 mg via INTRAVENOUS
  Administered 2013-02-25: 90 mg via INTRAVENOUS

## 2013-02-25 MED ORDER — SODIUM CHLORIDE 0.9 % IV SOLN
INTRAVENOUS | Status: DC | PRN
  Administered 2013-02-25 (×2): via INTRAVENOUS

## 2013-02-25 MED ORDER — THROMBIN 20000 UNITS EX SOLR
CUTANEOUS | Status: AC
  Filled 2013-02-25: qty 20000

## 2013-02-25 MED ORDER — SODIUM CHLORIDE 0.9 % IR SOLN
Status: DC | PRN
  Administered 2013-02-25: 09:00:00

## 2013-02-25 MED ORDER — OXYCODONE-ACETAMINOPHEN 5-325 MG PO TABS
ORAL_TABLET | ORAL | Status: AC
  Administered 2013-02-25: 2 via ORAL
  Filled 2013-02-25: qty 2

## 2013-02-25 MED ORDER — FENTANYL CITRATE 0.05 MG/ML IJ SOLN
INTRAMUSCULAR | Status: AC
  Administered 2013-02-25: 25 ug via INTRAVENOUS
  Filled 2013-02-25: qty 2

## 2013-02-25 MED ORDER — OXYCODONE-ACETAMINOPHEN 5-325 MG PO TABS: 2.0000 | ORAL_TABLET | Freq: Once | ORAL | Status: AC

## 2013-02-25 MED ORDER — OXYCODONE HCL 5 MG PO TABS: 5.0000 mg | ORAL_TABLET | Freq: Once | ORAL | Status: DC | PRN

## 2013-02-25 MED ORDER — SUCCINYLCHOLINE CHLORIDE 20 MG/ML IJ SOLN
INTRAMUSCULAR | Status: DC | PRN
  Administered 2013-02-25: 140 mg via INTRAVENOUS

## 2013-02-25 MED ORDER — MUPIROCIN 2 % EX OINT: TOPICAL_OINTMENT | Freq: Two times a day (BID) | CUTANEOUS | Status: DC

## 2013-02-25 MED ORDER — LIDOCAINE HCL (CARDIAC) 20 MG/ML IV SOLN
INTRAVENOUS | Status: DC | PRN
  Administered 2013-02-25: 70 mg via INTRAVENOUS

## 2013-02-25 MED ORDER — 0.9 % SODIUM CHLORIDE (POUR BTL) OPTIME
TOPICAL | Status: DC | PRN
  Administered 2013-02-25: 1000 mL

## 2013-02-25 SURGICAL SUPPLY — 40 items
ADH SKN CLS APL DERMABOND .7 (GAUZE/BANDAGES/DRESSINGS) ×1
CANISTER SUCTION 2500CC (MISCELLANEOUS) ×2 IMPLANT
CLIP TI MEDIUM 6 (CLIP) ×2 IMPLANT
CLIP TI WIDE RED SMALL 6 (CLIP) ×2 IMPLANT
CLOTH BEACON ORANGE TIMEOUT ST (SAFETY) ×2 IMPLANT
COVER PROBE W GEL 5X96 (DRAPES) ×2 IMPLANT
COVER SURGICAL LIGHT HANDLE (MISCELLANEOUS) ×2 IMPLANT
DECANTER SPIKE VIAL GLASS SM (MISCELLANEOUS) ×2 IMPLANT
DERMABOND ADVANCED (GAUZE/BANDAGES/DRESSINGS) ×1
DERMABOND ADVANCED .7 DNX12 (GAUZE/BANDAGES/DRESSINGS) ×1 IMPLANT
DRAIN PENROSE 1/4X12 LTX STRL (WOUND CARE) ×2 IMPLANT
ELECT REM PT RETURN 9FT ADLT (ELECTROSURGICAL) ×2
ELECTRODE REM PT RTRN 9FT ADLT (ELECTROSURGICAL) ×1 IMPLANT
GEL ULTRASOUND 20GR AQUASONIC (MISCELLANEOUS) IMPLANT
GLOVE BIO SURGEON STRL SZ7.5 (GLOVE) ×2 IMPLANT
GLOVE BIOGEL PI IND STRL 6.5 (GLOVE) IMPLANT
GLOVE BIOGEL PI IND STRL 7.0 (GLOVE) IMPLANT
GLOVE BIOGEL PI IND STRL 7.5 (GLOVE) IMPLANT
GLOVE BIOGEL PI INDICATOR 6.5 (GLOVE) ×1
GLOVE BIOGEL PI INDICATOR 7.0 (GLOVE) ×1
GLOVE BIOGEL PI INDICATOR 7.5 (GLOVE) ×1
GLOVE ECLIPSE 6.5 STRL STRAW (GLOVE) ×1 IMPLANT
GLOVE SURG SS PI 7.5 STRL IVOR (GLOVE) ×2 IMPLANT
GOWN PREVENTION PLUS XLARGE (GOWN DISPOSABLE) ×3 IMPLANT
GOWN STRL NON-REIN LRG LVL3 (GOWN DISPOSABLE) ×2 IMPLANT
KIT BASIN OR (CUSTOM PROCEDURE TRAY) ×2 IMPLANT
KIT ROOM TURNOVER OR (KITS) ×2 IMPLANT
LOOP VESSEL MINI RED (MISCELLANEOUS) IMPLANT
NS IRRIG 1000ML POUR BTL (IV SOLUTION) ×2 IMPLANT
PACK CV ACCESS (CUSTOM PROCEDURE TRAY) ×2 IMPLANT
PAD ARMBOARD 7.5X6 YLW CONV (MISCELLANEOUS) ×4 IMPLANT
SPONGE SURGIFOAM ABS GEL 100 (HEMOSTASIS) IMPLANT
SUT PROLENE 7 0 BV 1 (SUTURE) ×4 IMPLANT
SUT VIC AB 3-0 SH 27 (SUTURE) ×2
SUT VIC AB 3-0 SH 27X BRD (SUTURE) ×1 IMPLANT
SUT VICRYL 4-0 PS2 18IN ABS (SUTURE) ×2 IMPLANT
TOWEL OR 17X24 6PK STRL BLUE (TOWEL DISPOSABLE) ×2 IMPLANT
TOWEL OR 17X26 10 PK STRL BLUE (TOWEL DISPOSABLE) ×2 IMPLANT
UNDERPAD 30X30 INCONTINENT (UNDERPADS AND DIAPERS) ×2 IMPLANT
WATER STERILE IRR 1000ML POUR (IV SOLUTION) ×2 IMPLANT

## 2013-02-25 NOTE — Progress Notes (Signed)
Clinton Gallant, PA, at bedside. Evaluated pt. Pt okay to D/C home.

## 2013-02-25 NOTE — Progress Notes (Signed)
When pt arrived in PACU, BP cuff applied to Right forearm by NT. BP started. BP cuff removed prior to completion of BP; as soon as it was realized that right arm was restricted due to new fistula. Restricted extremity bracelet applied in PACU. Fistula has strong bruit/thrill. Small amount of puffiness noted at site and marked. Clinton Gallant, PA, notified. PA to evaluate pt in PACU.

## 2013-02-25 NOTE — Anesthesia Procedure Notes (Addendum)
Procedure Name: LMA Insertion Date/Time: 02/25/2013 9:51 AM Performed by: Orvilla Fus A Pre-anesthesia Checklist: Patient identified, Timeout performed, Emergency Drugs available, Suction available and Patient being monitored Patient Re-evaluated:Patient Re-evaluated prior to inductionOxygen Delivery Method: Circle system utilized Preoxygenation: Pre-oxygenation with 100% oxygen Intubation Type: IV induction Ventilation: Mask ventilation without difficulty and Oral airway inserted - appropriate to patient size LMA: LMA with gastric port inserted LMA Size: 5.0 Number of attempts: 1 Tube secured with: Tape Dental Injury: Teeth and Oropharynx as per pre-operative assessment    Procedure Name: Intubation Date/Time: 02/25/2013 10:14 AM Performed by: Orvilla Fus A Pre-anesthesia Checklist: Patient identified, Timeout performed, Emergency Drugs available, Suction available and Patient being monitored Patient Re-evaluated:Patient Re-evaluated prior to inductionOxygen Delivery Method: Circle system utilized Preoxygenation: Pre-oxygenation with 100% oxygen Intubation Type: IV induction Grade View: Grade I Tube type: Oral Tube size: 7.5 mm Number of attempts: 1 Airway Equipment and Method: Rigid stylet and Video-laryngoscopy Placement Confirmation: ETT inserted through vocal cords under direct vision,  breath sounds checked- equal and bilateral and positive ETCO2 Secured at: 23 (at lip) cm Tube secured with: Tape Dental Injury: Teeth and Oropharynx as per pre-operative assessment  Comments: Difficulty due to morbid obesity. No issues with Glidescope intubation.

## 2013-02-25 NOTE — Preoperative (Signed)
Beta Blockers   Reason not to administer Beta Blockers:Not Applicable 

## 2013-02-25 NOTE — Telephone Encounter (Addendum)
Message copied by Fredrich Birks on Wed Feb 25, 2013  3:49 PM ------      Message from: Melene Plan      Created: Wed Feb 25, 2013 12:29 PM                   ----- Message -----         From: Sherren Kerns, MD         Sent: 02/25/2013  11:15 AM           To: Reuel Derby, Melene Plan, RN, #            Right radial cephalic AVF      Needs follow up in one month            Nurse asst            Charles ------  02/25/13  LM for patient on home # regarding follow up, dpm

## 2013-02-25 NOTE — Anesthesia Preprocedure Evaluation (Signed)
Anesthesia Evaluation  Patient identified by MRN, date of birth, ID band Patient awake    Reviewed: Allergy & Precautions, H&P , NPO status , Patient's Chart, lab work & pertinent test results  Airway Mallampati: II  Neck ROM: full    Dental   Pulmonary asthma , Current Smoker,          Cardiovascular hypertension, + CAD, + Past MI, + CABG and +CHF     Neuro/Psych    GI/Hepatic   Endo/Other  diabetes, Type obesity  Renal/GU ESRFRenal disease     Musculoskeletal   Abdominal   Peds  Hematology   Anesthesia Other Findings   Reproductive/Obstetrics                           Anesthesia Physical Anesthesia Plan  ASA: III  Anesthesia Plan: General   Post-op Pain Management:    Induction: Intravenous  Airway Management Planned: LMA  Additional Equipment:   Intra-op Plan:   Post-operative Plan:   Informed Consent: I have reviewed the patients History and Physical, chart, labs and discussed the procedure including the risks, benefits and alternatives for the proposed anesthesia with the patient or authorized representative who has indicated his/her understanding and acceptance.     Plan Discussed with: CRNA, Anesthesiologist and Surgeon  Anesthesia Plan Comments:         Anesthesia Quick Evaluation

## 2013-02-25 NOTE — Anesthesia Postprocedure Evaluation (Signed)
Anesthesia Post Note  Patient: Timothy Banks  Procedure(s) Performed: Procedure(s) (LRB): ARTERIOVENOUS (AV) FISTULA CREATION (Right)  Anesthesia type: General  Patient location: PACU  Post pain: Pain level controlled and Adequate analgesia  Post assessment: Post-op Vital signs reviewed, Patient's Cardiovascular Status Stable, Respiratory Function Stable, Patent Airway and Pain level controlled  Last Vitals:  Filed Vitals:   02/25/13 1230  BP:   Pulse: 61  Temp:   Resp: 23    Post vital signs: Reviewed and stable  Level of consciousness: awake, alert  and oriented  Complications: No apparent anesthesia complications

## 2013-02-25 NOTE — Op Note (Signed)
Procedure: Right Radial Cephalic AV fistula   Preop: ESRD   Postop: ESRD   Anesthesia: General   Assistant: Tilden Fossa, RNFA   Findings: 3.5 mm cephalic vein       3  mm radial artery calcified  Procedure Details:  The right upper extremity was prepped and draped in usual sterile fashion. Local anesthesia was infiltrated midway between the cephalic and radial artery anatomically.A longitudinal skin incision was then made in this location at the distal left forearm. The incision was carried into the subcutaneous tissues down to level cephalic vein. The vein had some spasm but was overall reasonable quality accepting a 3.5 mm dilator. The vein was dissected free circumferentially and small side branches ligated and divided between silk ties. The distal end was ligated and the vein probed and found to accept up to a 3.5 mm dilator. This was gently distended with heparinized saline, spatulated, and marked for orientation. Next the radial artery was dissected free in the medial portion incision. The artery was 3 mm in diameter it was thickened but had a reasonable pulse. The vessel loops were placed proximal and distal to the planned site of arteriotomy. The patient was given 5000 units of intravenous heparin. After appropriate circulation time, the vessel loops were used to control the artery. A longitudinal opening was made in the left radial artery. The vein was controlled proximally with a fine bulldog clamp. The vein was then swung over to the artery and sewn end of vein to side of artery using a running 7-0 Prolene suture. Just prior to completion, the anastomosis was fore bled back bled and thoroughly flushed. The anastomosis was secured, vessel loops released, and there was a palpable thrill in the fistula immediately. After hemostasis was obtained, the subcutaneous tissues were reapproximated using a running 3-0 Vicryl suture. The skin was then closed with a 4 Vicryl subcuticular stitch.  Dermabond was applied to the skin incision. The patient tolerated the procedure well and there were no complications. Instrument sponge and needle count were correct at the end of the case. The patient was taken to PACU in stable condition.   Fabienne Bruns, MD  [facility]  Office: [phone]  Pager: [phone]

## 2013-02-25 NOTE — Transfer of Care (Signed)
Immediate Anesthesia Transfer of Care Note  Patient: Timothy Banks  Procedure(s) Performed: Procedure(s) with comments: ARTERIOVENOUS (AV) FISTULA CREATION (Right) - Right Radiocephalic AVF  Patient Location: PACU  Anesthesia Type:General  Level of Consciousness: awake and alert   Airway & Oxygen Therapy: Patient Spontanous Breathing and Patient connected to face mask oxygen  Post-op Assessment: Report given to PACU RN, Post -op Vital signs reviewed and stable and Patient moving all extremities  Post vital signs: Reviewed and stable  Complications: No apparent anesthesia complications

## 2013-02-25 NOTE — Interval H&P Note (Signed)
History and Physical Interval Note:  02/25/2013 8:56 AM  Timothy Banks  has presented today for surgery, with the diagnosis of End Stage Renal Disease  The various methods of treatment have been discussed with the patient and family. After consideration of risks, benefits and other options for treatment, the patient has consented to  Procedure(s) with comments: ARTERIOVENOUS (AV) FISTULA CREATION (Right) - Right Radiocephalic AVF as a surgical intervention .  The patient's history has been reviewed, patient examined, no change in status, stable for surgery.  I have reviewed the patient's chart and labs.  Questions were answered to the patient's satisfaction.     FIELDS,CHARLES E

## 2013-02-25 NOTE — H&P (View-Only) (Signed)
[facility] CONSULT NOTE 02/20/2013 DOB: 2056-06-08 MRN : [id number]  [id number] Referring Physician: Dr. Sherlon Handing  History of Present Illness: JOHNGABRIEL Banks is a 57 y.o. male With hx DM, morbid obesity and volume overload with severe CKD who will need HD soon. He is LHD. Dr. Lowell Guitar asked [place] to assess for HD access. He is not yet on HD  Past Medical History  Diagnosis Date  . Myocardial infarction   . Diabetes mellitus without complication   . CHF (congestive heart failure)   . Asthma   . Hyperlipidemia   . Hypertension   . Chronic kidney disease 02/17/2013    ACUTE RENAL    Past Surgical History  Procedure Laterality Date  . Endarterectomy  2002    right side  . Coronary artery bypass graft  2002     ROS: [x]  Positive  [ ]  Denies    General: [ ]  Weight loss, [ ]  Fever, [ ]  chills [x]  obese Neurologic: [ ]  Dizziness, [ ]  Blackouts, [ ]  Seizure [ ]  Stroke, [ ]  "Mini stroke", [ ]  Slurred speech, [ ]  Temporary blindness; [ ]  weakness in arms or legs, [ ]  Hoarseness Cardiac: [ ]  Chest pain/pressure, [x ] Shortness of breath at rest [ x] Shortness of breath with exertion, [ ]  Atrial fibrillation or irregular heartbeat Vascular: [ ]  Pain in legs with walking, [ ]  Pain in legs at rest, [ ]  Pain in legs at night,  [ ]  Non-healing ulcer, [ ]  Blood clot in vein/DVT,   Pulmonary: [ ]  Home oxygen, [ ]  Productive cough, [ ]  Coughing up blood, [ ]  Asthma,  [ ]  Wheezing Musculoskeletal:  [ ]  Arthritis, [x ] Low back pain, [ ]  Joint pain Hematologic: [ ]  Easy Bruising, [ ]  Anemia; [ ]  Hepatitis Gastrointestinal: [ ]  Blood in stool, [ ]  Gastroesophageal Reflux/heartburn, [ ]  Trouble swallowing Urinary: [x ] chronic Kidney disease, [ ]  on HD - [ ]  MWF or [ ]  TTHS, [ ]  Burning with urination, [ ]  Difficulty urinating Skin: [ ]  Rashes, [ ]  Wounds Psychological: [ ]  Anxiety, [ ]  Depression  Social History History  Substance Use Topics  . Smoking status: Current  Every Day Smoker -- 1.00 packs/day for 25 years    Types: Cigarettes    Start date: 11/05/1973  . Smokeless tobacco: Never Used     Comment: quit for 10 years, started back 9 mo ago  . Alcohol Use: No    Family History Family History  Problem Relation Age of Onset  . Diabetes Mother   . Heart disease Mother   . Diabetes Father   . Heart disease Father    No Known Allergies  Current Facility-Administered Medications  Medication Dose Route Frequency Provider Last Rate Last Dose  . 0.9 %  sodium chloride infusion  250 mL Intravenous PRN Oretha Milch, MD 20 mL/hr at 02/18/13 2157 250 mL at 02/18/13 2157  . acetaminophen (TYLENOL) tablet 650 mg  650 mg Oral Q6H PRN Dow Adolph, MD   650 mg at 02/20/13 0406  . albuterol (PROVENTIL HFA;VENTOLIN HFA) 108 (90 BASE) MCG/ACT inhaler 2 puff  2 puff Inhalation Q6H PRN Oretha Milch, MD      . calcium acetate (PHOSLO) capsule 667 mg  667 mg Oral TID WC Lauris Poag, MD   667 mg at 02/20/13 9147  . Chlorhexidine Gluconate Cloth 2 % PADS 6 each  6 each Topical Q0600 Reuel Boom  Daneil Dan, MD   6 each at 02/20/13 [phone]  . diphenhydrAMINE (BENADRYL) capsule 25 mg  25 mg Oral QHS PRN Storm Frisk, MD   25 mg at 02/19/13 2238  . furosemide (LASIX) 100 mg in dextrose 5 % 50 mL IVPB  100 mg Intravenous TID Lauris Poag, MD      . heparin injection 1,200-2,400 Units  1.2-2.4 mL Intravenous PRN Nelda Bucks, MD      . heparin injection 5,000 Units  5,000 Units Subcutaneous Q8H Oretha Milch, MD   5,000 Units at 02/19/13 2226  . hydrALAZINE (APRESOLINE) tablet 50 mg  50 mg Oral Q6H Oretha Milch, MD   50 mg at 02/20/13 0052  . insulin aspart (novoLOG) injection 0-15 Units  0-15 Units Subcutaneous TID WC Ky Barban, MD   2 Units at 02/20/13 [phone]  . insulin glargine (LANTUS) injection 15 Units  15 Units Subcutaneous QHS Oretha Milch, MD   15 Units at 02/19/13 2223  . isosorbide mononitrate (ISMO,MONOKET) tablet 10 mg  10 mg Oral BID  Oretha Milch, MD   10 mg at 02/19/13 2224  . metoprolol (LOPRESSOR) tablet 50 mg  50 mg Oral BID Oretha Milch, MD   50 mg at 02/19/13 2224  . mupirocin ointment (BACTROBAN) 2 % 1 application  1 application Nasal BID Nelda Bucks, MD   1 application at 02/19/13 2225  . simvastatin (ZOCOR) tablet 20 mg  20 mg Oral QPM Oretha Milch, MD   20 mg at 02/19/13 1802     Imaging: Dg Chest Port 1 View  02/19/2013  *RADIOLOGY REPORT*  Clinical Data:  Pulmonary edema  PORTABLE CHEST - 1 VIEW  Comparison: Portable exam 0608 hours compared to 02/17/2013  Findings: Right jugular central venous catheter with tip projecting over SVC. Enlargement of cardiac silhouette post CABG. Pulmonary vascular congestion. Decreased lung volumes. Accentuation of perihilar markings likely represents mild pulmonary edema. No segmental consolidation, pleural effusion, or pneumothorax.  IMPRESSION: Probable mild CHF.   Original Report Authenticated By: Ulyses Southward, M.D.     Significant Diagnostic Studies: CBC Lab Results  Component Value Date   WBC 10.9* 02/20/2013   HGB 8.7* 02/20/2013   HCT 27.3* 02/20/2013   MCV 79.8 02/20/2013   PLT 240 02/20/2013    BMET    Component Value Date/Time   NA 136 02/20/2013 0400   K 3.8 02/20/2013 0400   CL 92* 02/20/2013 0400   CO2 34* 02/20/2013 0400   GLUCOSE 136* 02/20/2013 0400   BUN 91* 02/20/2013 0400   CREATININE 3.99* 02/20/2013 0400   CALCIUM 8.7 02/20/2013 0400   GFRNONAA 15* 02/20/2013 0400   GFRAA 18* 02/20/2013 0400    COAG No results found for this basename: INR, PROTIME   No results found for this basename: PTT     Physical Examination BP Readings from Last 3 Encounters:  02/20/13 113/27  10/01/12 175/85   Temp Readings from Last 3 Encounters:  02/20/13 97.1 F (36.2 C) Oral   SpO2 Readings from Last 3 Encounters:  02/20/13 95%  10/01/12 93%   Pulse Readings from Last 3 Encounters:  02/20/13 58  10/01/12 77    General:  WDWN obese male in NAD Gait:  Normal HENT: WNL Eyes: Pupils equal Pulmonary: normal mildly labored breathing  Cardiac: RRR, Abdomen: soft, NT, no masses Skin: no rashes, ulcers noted Vascular Exam/Pulses: 2+ radial pulse bilaterally Extremities without ischemic changes, no Gangrene , no  cellulitis; no open wounds;  Musculoskeletal: no muscle wasting or atrophy  Neurologic: A&O X 3; Appropriate Affect ;  SENSATION: normal; MOTOR FUNCTION: Pt has good and equal strength in all extremities - 5/5 Speech is fluent/normal  Non-Invasive Vascular Imaging: vein mapping pending  ASSESSMENT/PLAN:Margues P Mcparland is a 57 y.o. male With ESRD, CAD, DM and acute resp distress who is being diuresed and will need HD in the near future. CR 3.99 Plan Vein mapping and to place AVF/AVGG next week. Pt obesity poses issue of need for second surgery to superficialize a fistula.  Pt had CABG and right carotid endarterectomy 12 years ago here at [facility] - ? F/U of carotid arteries by cardiology. No F/U noted in EPIC in our office    History and details as above.  Pt is left handed.  Vein mapping is pending.  Will review result and decide on access plan and date.    Fabienne Bruns, MD [facility] [place] Office: [phone] Pager: [phone]

## 2013-02-26 ENCOUNTER — Encounter (HOSPITAL_COMMUNITY): Payer: Self-pay | Admitting: Vascular Surgery

## 2013-03-02 ENCOUNTER — Inpatient Hospital Stay (HOSPITAL_COMMUNITY)
Admission: EM | Admit: 2013-03-02 | Discharge: 2013-03-06 | DRG: 292 | Disposition: A | Discharge status: Home Health | Payer: Medicare Other | Attending: Family Medicine | Admitting: Family Medicine

## 2013-03-02 ENCOUNTER — Encounter (HOSPITAL_COMMUNITY): Payer: Self-pay | Admitting: *Deleted

## 2013-03-02 ENCOUNTER — Emergency Department (HOSPITAL_COMMUNITY): Payer: Medicare Other

## 2013-03-02 DIAGNOSIS — I129 Hypertensive chronic kidney disease with stage 1 through stage 4 chronic kidney disease, or unspecified chronic kidney disease: Secondary | ICD-10-CM | POA: Diagnosis present

## 2013-03-02 DIAGNOSIS — E8779 Other fluid overload: Secondary | ICD-10-CM | POA: Diagnosis present

## 2013-03-02 DIAGNOSIS — E8770 Fluid overload, unspecified: Secondary | ICD-10-CM

## 2013-03-02 DIAGNOSIS — E1165 Type 2 diabetes mellitus with hyperglycemia: Secondary | ICD-10-CM

## 2013-03-02 DIAGNOSIS — Z79899 Other long term (current) drug therapy: Secondary | ICD-10-CM

## 2013-03-02 DIAGNOSIS — E785 Hyperlipidemia, unspecified: Secondary | ICD-10-CM | POA: Diagnosis present

## 2013-03-02 DIAGNOSIS — IMO0002 Reserved for concepts with insufficient information to code with codable children: Secondary | ICD-10-CM | POA: Diagnosis present

## 2013-03-02 DIAGNOSIS — M545 Low back pain: Secondary | ICD-10-CM

## 2013-03-02 DIAGNOSIS — I252 Old myocardial infarction: Secondary | ICD-10-CM

## 2013-03-02 DIAGNOSIS — Z951 Presence of aortocoronary bypass graft: Secondary | ICD-10-CM

## 2013-03-02 DIAGNOSIS — I509 Heart failure, unspecified: Secondary | ICD-10-CM | POA: Diagnosis present

## 2013-03-02 DIAGNOSIS — J811 Chronic pulmonary edema: Secondary | ICD-10-CM | POA: Diagnosis present

## 2013-03-02 DIAGNOSIS — Z8249 Family history of ischemic heart disease and other diseases of the circulatory system: Secondary | ICD-10-CM

## 2013-03-02 DIAGNOSIS — Z833 Family history of diabetes mellitus: Secondary | ICD-10-CM

## 2013-03-02 DIAGNOSIS — N19 Unspecified kidney failure: Secondary | ICD-10-CM

## 2013-03-02 DIAGNOSIS — Z87891 Personal history of nicotine dependence: Secondary | ICD-10-CM

## 2013-03-02 DIAGNOSIS — Z6841 Body Mass Index (BMI) 40.0 and over, adult: Secondary | ICD-10-CM

## 2013-03-02 DIAGNOSIS — D631 Anemia in chronic kidney disease: Secondary | ICD-10-CM | POA: Diagnosis present

## 2013-03-02 DIAGNOSIS — N179 Acute kidney failure, unspecified: Secondary | ICD-10-CM | POA: Diagnosis present

## 2013-03-02 DIAGNOSIS — I2510 Atherosclerotic heart disease of native coronary artery without angina pectoris: Secondary | ICD-10-CM | POA: Diagnosis present

## 2013-03-02 DIAGNOSIS — I5033 Acute on chronic diastolic (congestive) heart failure: Principal | ICD-10-CM | POA: Diagnosis present

## 2013-03-02 DIAGNOSIS — N039 Chronic nephritic syndrome with unspecified morphologic changes: Secondary | ICD-10-CM | POA: Diagnosis present

## 2013-03-02 DIAGNOSIS — E118 Type 2 diabetes mellitus with unspecified complications: Secondary | ICD-10-CM | POA: Diagnosis present

## 2013-03-02 DIAGNOSIS — E876 Hypokalemia: Secondary | ICD-10-CM

## 2013-03-02 DIAGNOSIS — M79672 Pain in left foot: Secondary | ICD-10-CM

## 2013-03-02 DIAGNOSIS — J45909 Unspecified asthma, uncomplicated: Secondary | ICD-10-CM | POA: Diagnosis present

## 2013-03-02 DIAGNOSIS — J9600 Acute respiratory failure, unspecified whether with hypoxia or hypercapnia: Secondary | ICD-10-CM

## 2013-03-02 DIAGNOSIS — N184 Chronic kidney disease, stage 4 (severe): Secondary | ICD-10-CM | POA: Diagnosis present

## 2013-03-02 LAB — BASIC METABOLIC PANEL
GFR calc Af Amer: 21 mL/min — ABNORMAL LOW (ref >90)
GFR calc non Af Amer: 18 mL/min — ABNORMAL LOW (ref >90)
Potassium: 4.8 mEq/L (ref 3.5–5.1)
Sodium: 133 mEq/L — ABNORMAL LOW (ref 135–145)

## 2013-03-02 LAB — POCT I-STAT TROPONIN I: Troponin i, poc: 0.04 ng/mL (ref 0.00–0.08)

## 2013-03-02 LAB — CBC
MCHC: 31.1 g/dL (ref 30.0–36.0)
RDW: 17.6 % — ABNORMAL HIGH (ref 11.5–15.5)

## 2013-03-02 MED ORDER — FUROSEMIDE 10 MG/ML IJ SOLN
120.0000 mg | Freq: Once | INTRAVENOUS | Status: AC
  Administered 2013-03-02: 120 mg via INTRAVENOUS
  Filled 2013-03-02: qty 12

## 2013-03-02 NOTE — ED Provider Notes (Signed)
History     CSN: [id number]  Arrival date & time 03/02/13  1943   First MD Initiated Contact with Patient 03/02/13 2106      Chief Complaint  Patient presents with  . Shortness of Breath    (Consider location/radiation/quality/duration/timing/severity/associated sxs/prior treatment) Patient is a 57 y.o. male presenting with shortness of breath. The history is provided by the patient.  Shortness of Breath Associated symptoms: no abdominal pain, no chest pain, no headaches, no rash and no vomiting   patient presents for acute on chronic shortness of breath. He has chronic kidney disease and is getting started towards dialysis. He is a dialysis graft in place but is not mature yet. He's had increasing shortness of breath and talk to his nephrologist who told him to come in to the ER. He states she's caring fluid on his legs. He states he's had a decreased his urine output. No chest pain. No fevers. No cough.patient states that the oxygen here works better than his oxygen was at home.  Past Medical History  Diagnosis Date  . Myocardial infarction   . Diabetes mellitus without complication   . CHF (congestive heart failure)   . Asthma   . Hyperlipidemia   . Hypertension   . Chronic kidney disease 02/17/2013    ACUTE RENAL    Past Surgical History  Procedure Laterality Date  . Right carotid endarterectomy  2002    right side  . Coronary artery bypass graft  2002  . Av fistula placement Right 02/25/2013    Procedure: ARTERIOVENOUS (AV) FISTULA CREATION;  Surgeon: Sherren Kerns, MD;  Location: [facility] OR;  Service: Vascular;  Laterality: Right;  Right Radiocephalic AVF    Family History  Problem Relation Age of Onset  . Diabetes Mother   . Heart disease Mother   . Diabetes Father   . Heart disease Father     History  Substance Use Topics  . Smoking status: Current Every Day Smoker -- 1.00 packs/day for 25 years    Types: Cigarettes    Start date: 11/05/1973  . Smokeless  tobacco: Never Used     Comment: quit for 10 years, started back 9 mo ago  . Alcohol Use: No      Review of Systems  Constitutional: Negative for activity change and appetite change.  HENT: Negative for neck stiffness.   Eyes: Negative for pain.  Respiratory: Positive for shortness of breath. Negative for chest tightness.   Cardiovascular: Positive for leg swelling. Negative for chest pain.  Gastrointestinal: Negative for nausea, vomiting, abdominal pain and diarrhea.  Genitourinary: Negative for flank pain.  Musculoskeletal: Negative for back pain.  Skin: Negative for rash.  Neurological: Negative for weakness, numbness and headaches.  Psychiatric/Behavioral: Negative for behavioral problems.    Allergies  Review of patient's allergies indicates no known allergies.  Home Medications   Current Outpatient Rx  Name  Route  Sig  Dispense  Refill  . albuterol (PROVENTIL HFA;VENTOLIN HFA) 108 (90 BASE) MCG/ACT inhaler   Inhalation   Inhale 2 puffs into the lungs every 6 (six) hours as needed for wheezing or shortness of breath.         Marland Kitchen amLODipine (NORVASC) 5 MG tablet   Oral   Take 1 tablet (5 mg total) by mouth at bedtime.   30 tablet   0   . furosemide (LASIX) 80 MG tablet   Oral   Take 2 tablets (160 mg total) by mouth 2 (two) times daily.  60 tablet   0   . glipiZIDE (GLUCOTROL) 10 MG tablet   Oral   Take 10 mg by mouth 2 (two) times daily before a meal.         . isosorbide mononitrate (IMDUR) 30 MG 24 hr tablet   Oral   Take 30 mg by mouth daily.         . metoprolol (LOPRESSOR) 50 MG tablet   Oral   Take 1 tablet (50 mg total) by mouth 2 (two) times daily.   60 tablet   0   . nitroGLYCERIN (NITROSTAT) 0.4 MG SL tablet   Sublingual   Place 0.4 mg under the tongue every 5 (five) minutes as needed for chest pain.         Marland Kitchen oxyCODONE-acetaminophen (PERCOCET) 10-325 MG per tablet   Oral   Take 1 tablet by mouth every 8 (eight) hours as needed  for pain.          . potassium chloride SA (K-DUR,KLOR-CON) 20 MEQ tablet   Oral   Take 2 tablets (40 mEq total) by mouth daily.   15 tablet   0   . PRESCRIPTION MEDICATION   Subcutaneous   Inject 3 Units into the skin once.         . simvastatin (ZOCOR) 20 MG tablet   Oral   Take 20 mg by mouth every evening.           BP 136/56  Pulse 68  Temp(Src) 99 F (37.2 C) (Oral)  Resp 22  SpO2 97%  Physical Exam  Nursing note and vitals reviewed. Constitutional: He is oriented to person, place, and time. He appears well-developed and well-nourished.  Patient is obese  HENT:  Head: Normocephalic and atraumatic.  Eyes: EOM are normal. Pupils are equal, round, and reactive to light.  Neck: Normal range of motion. Neck supple.  Cardiovascular: Normal rate, regular rhythm and normal heart sounds.   No murmur heard. Pulmonary/Chest:  Mild tachypnea. Few scattered rales  Abdominal: Soft. Bowel sounds are normal. He exhibits no distension and no mass. There is no tenderness. There is no rebound and no guarding.  Musculoskeletal: Normal range of motion. He exhibits edema.  Pitting edema to bilateral lower extremity. Dialysis graft right forearm.  Neurological: He is alert and oriented to person, place, and time. No cranial nerve deficit.  Skin: Skin is warm and dry.  Psychiatric: He has a normal mood and affect.    ED Course  Procedures (including critical care time)  Labs Reviewed  CBC - Abnormal; Notable for the following:    WBC 13.2 (*)    RBC 3.19 (*)    Hemoglobin 8.0 (*)    HCT 25.7 (*)    MCH 25.1 (*)    RDW 17.6 (*)    All other components within normal limits  BASIC METABOLIC PANEL - Abnormal; Notable for the following:    Sodium 133 (*)    Chloride 88 (*)    CO2 36 (*)    Glucose, Bld 199 (*)    BUN 96 (*)    Creatinine, Ser 3.49 (*)    Calcium 8.3 (*)    GFR calc non Af Amer 18 (*)    GFR calc Af Amer 21 (*)    All other components within normal  limits  PRO B NATRIURETIC PEPTIDE - Abnormal; Notable for the following:    Pro B Natriuretic peptide (BNP) 12262.0 (*)    All other components within  normal limits  POCT I-STAT TROPONIN I   Dg Chest 2 View  03/02/2013  *RADIOLOGY REPORT*  Clinical Data: Shortness of breath.  Fluid on lungs.  CHEST - 2 VIEW  Comparison: 02/19/2013.  Findings: The right IJ central venous catheter has been removed. There is stable cardiomegaly status post median sternotomy and CABG.  Interstitial edema has improved, although there is evidence of small pleural effusions bilaterally.  There is no confluent airspace opacity or pneumothorax.  IMPRESSION: Cardiomegaly with interstitial pulmonary edema and small bilateral pleural effusions consistent with mild congestive heart failure.   Original Report Authenticated By: Carey Bullocks, M.D.      1. Renal failure   2. CHF (congestive heart failure)     Date: 03/02/2013  Rate: 72  Rhythm: normal sinus rhythm  QRS Axis: normal  Intervals: normal  ST/T Wave abnormalities: nonspecific ST/T changes  Conduction Disutrbances:none  Narrative Interpretation: nonspecific ST and T wave changes.  Old EKG Reviewed: changes noted     MDM  Patient with shortness of breath. Lab work is close to baseline. X-ray shows CHF. After discussion with Dr. Hyman Hopes from nephrology the patient will be admitted to internal medicine        Juliet Rude. Rubin Payor, MD 03/02/13 [phone]

## 2013-03-02 NOTE — ED Notes (Signed)
Pt c/o SOB all day today, hx of CHF and will soon start dialysis, had shunt put in last week.  Tachypneic, states Dr. Hyman Hopes (nephrology) sent him to "get his labs checked".

## 2013-03-02 NOTE — ED Notes (Signed)
[phone].  (Son) call with room number when admitted

## 2013-03-03 ENCOUNTER — Encounter (HOSPITAL_COMMUNITY): Payer: Self-pay | Admitting: *Deleted

## 2013-03-03 DIAGNOSIS — N184 Chronic kidney disease, stage 4 (severe): Secondary | ICD-10-CM

## 2013-03-03 LAB — GLUCOSE, CAPILLARY: Glucose-Capillary: 126 mg/dL — ABNORMAL HIGH (ref 70–99)

## 2013-03-03 LAB — HEPATITIS B SURFACE ANTIGEN: Hepatitis B Surface Ag: NEGATIVE

## 2013-03-03 LAB — POCT I-STAT, CHEM 8
BUN: 116 mg/dL — ABNORMAL HIGH (ref 6–23)
Chloride: 86 mEq/L — ABNORMAL LOW (ref 96–112)
Potassium: 4.7 mEq/L (ref 3.5–5.1)
Sodium: 134 mEq/L — ABNORMAL LOW (ref 135–145)

## 2013-03-03 MED ORDER — AMLODIPINE BESYLATE 5 MG PO TABS
5.0000 mg | ORAL_TABLET | Freq: Every day | ORAL | Status: DC
  Administered 2013-03-03 – 2013-03-05 (×4): 5 mg via ORAL
  Filled 2013-03-03 (×5): qty 1

## 2013-03-03 MED ORDER — ALPRAZOLAM 0.5 MG PO TABS
1.0000 mg | ORAL_TABLET | Freq: Once | ORAL | Status: AC
  Administered 2013-03-03: 1 mg via ORAL
  Filled 2013-03-03: qty 2

## 2013-03-03 MED ORDER — ISOSORBIDE MONONITRATE ER 30 MG PO TB24
30.0000 mg | ORAL_TABLET | Freq: Every day | ORAL | Status: DC
  Administered 2013-03-03 – 2013-03-06 (×4): 30 mg via ORAL
  Filled 2013-03-03 (×4): qty 1

## 2013-03-03 MED ORDER — ALBUTEROL SULFATE HFA 108 (90 BASE) MCG/ACT IN AERS
2.0000 | INHALATION_SPRAY | Freq: Four times a day (QID) | RESPIRATORY_TRACT | Status: DC | PRN
  Filled 2013-03-03: qty 6.7

## 2013-03-03 MED ORDER — HEPARIN SODIUM (PORCINE) 5000 UNIT/ML IJ SOLN
5000.0000 [IU] | Freq: Three times a day (TID) | INTRAMUSCULAR | Status: DC
  Administered 2013-03-03 – 2013-03-06 (×10): 5000 [IU] via SUBCUTANEOUS
  Filled 2013-03-03 (×15): qty 1

## 2013-03-03 MED ORDER — SODIUM CHLORIDE 0.9 % IJ SOLN
3.0000 mL | INTRAMUSCULAR | Status: DC | PRN
  Administered 2013-03-04 (×2): 3 mL via INTRAVENOUS

## 2013-03-03 MED ORDER — SODIUM CHLORIDE 0.9 % IV SOLN
1020.0000 mg | Freq: Once | INTRAVENOUS | Status: AC
  Administered 2013-03-03: 1020 mg via INTRAVENOUS
  Filled 2013-03-03: qty 34

## 2013-03-03 MED ORDER — NITROGLYCERIN 0.4 MG SL SUBL: 0.4000 mg | SUBLINGUAL_TABLET | SUBLINGUAL | Status: DC | PRN

## 2013-03-03 MED ORDER — METOPROLOL TARTRATE 50 MG PO TABS
50.0000 mg | ORAL_TABLET | Freq: Two times a day (BID) | ORAL | Status: DC
  Administered 2013-03-03 – 2013-03-06 (×8): 50 mg via ORAL
  Filled 2013-03-03 (×9): qty 1

## 2013-03-03 MED ORDER — SIMVASTATIN 20 MG PO TABS
20.0000 mg | ORAL_TABLET | Freq: Every evening | ORAL | Status: DC
  Administered 2013-03-03 – 2013-03-05 (×3): 20 mg via ORAL
  Filled 2013-03-03 (×4): qty 1

## 2013-03-03 MED ORDER — ONDANSETRON HCL 4 MG/2ML IJ SOLN: 4.0000 mg | Freq: Four times a day (QID) | INTRAMUSCULAR | Status: DC | PRN

## 2013-03-03 MED ORDER — OXYCODONE-ACETAMINOPHEN 10-325 MG PO TABS: 1.0000 | ORAL_TABLET | Freq: Three times a day (TID) | ORAL | Status: DC | PRN

## 2013-03-03 MED ORDER — OXYCODONE HCL 5 MG PO TABS: 5.0000 mg | ORAL_TABLET | Freq: Three times a day (TID) | ORAL | Status: DC | PRN

## 2013-03-03 MED ORDER — INSULIN ASPART 100 UNIT/ML ~~LOC~~ SOLN
0.0000 [IU] | Freq: Three times a day (TID) | SUBCUTANEOUS | Status: DC
  Administered 2013-03-03: 2 [IU] via SUBCUTANEOUS
  Administered 2013-03-03: 3 [IU] via SUBCUTANEOUS
  Administered 2013-03-04: 5 [IU] via SUBCUTANEOUS
  Administered 2013-03-04: 3 [IU] via SUBCUTANEOUS
  Administered 2013-03-05: 5 [IU] via SUBCUTANEOUS
  Administered 2013-03-05: 2 [IU] via SUBCUTANEOUS
  Administered 2013-03-05 – 2013-03-06 (×3): 3 [IU] via SUBCUTANEOUS

## 2013-03-03 MED ORDER — SODIUM CHLORIDE 0.9 % IV SOLN: 250.0000 mL | INTRAVENOUS | Status: DC | PRN

## 2013-03-03 MED ORDER — SODIUM CHLORIDE 0.9 % IJ SOLN
3.0000 mL | Freq: Two times a day (BID) | INTRAMUSCULAR | Status: DC
  Administered 2013-03-03 – 2013-03-06 (×7): 3 mL via INTRAVENOUS

## 2013-03-03 MED ORDER — INSULIN DETEMIR 100 UNIT/ML ~~LOC~~ SOLN
5.0000 [IU] | Freq: Every day | SUBCUTANEOUS | Status: DC
  Administered 2013-03-03 – 2013-03-05 (×3): 5 [IU] via SUBCUTANEOUS
  Filled 2013-03-03 (×5): qty 0.05

## 2013-03-03 MED ORDER — ACETAMINOPHEN 325 MG PO TABS: 650.0000 mg | ORAL_TABLET | ORAL | Status: DC | PRN

## 2013-03-03 MED ORDER — DARBEPOETIN ALFA-POLYSORBATE 150 MCG/0.3ML IJ SOLN
150.0000 ug | INTRAMUSCULAR | Status: DC
  Filled 2013-03-03: qty 0.3

## 2013-03-03 MED ORDER — FUROSEMIDE 10 MG/ML IJ SOLN
120.0000 mg | Freq: Three times a day (TID) | INTRAVENOUS | Status: DC
  Administered 2013-03-04 – 2013-03-05 (×4): 120 mg via INTRAVENOUS
  Filled 2013-03-03 (×6): qty 12

## 2013-03-03 MED ORDER — OXYCODONE-ACETAMINOPHEN 5-325 MG PO TABS: 1.0000 | ORAL_TABLET | Freq: Three times a day (TID) | ORAL | Status: DC | PRN

## 2013-03-03 NOTE — Progress Notes (Signed)
[facility] PROGRESS NOTE  Timothy Banks ZOX:[id number] DOB: 17-Jan-1956 DOA: 03/02/2013 PCP: Timothy Salk, MD  Assessment/Plan: 1. Acute on chronic CHF. Echocardiogram pending. Pt is responding to diuretics. Appreciate assitance from Timothy Banks.  Will cont to follow on his current dose of Lasix. (he had been on 200mg  q6)   CKD stage 4- Scr is actually better than when he left a week ago although his BUN is higher. No uremic symptoms, however pt is willing to go on HD to help with his volume and SOB if needed.   CAD s/p remote CABG - continue with BB, Imdur and statin   Anemia of CKD - stable Hb without bleed.   DM type 2 - SSI.  Off Glipizide for now.. Started Levemir at 5 units at bedtime..    Code Status: full Family Communication: wife  Disposition Plan: home   Consultants:  Nephrology   Procedures:  none  HPI/Subjective: Less dyspnea   Objective: Filed Vitals:   03/03/13 0100 03/03/13 0105 03/03/13 0201 03/03/13 0542  BP:  138/59 140/51 132/51  Pulse: 75 71 74 62  Temp:   98.1 F (36.7 C) 98 F (36.7 C)  TempSrc:   Oral Oral  Resp:  20 22 22   Height:   5\' 7"  (1.702 m)   Weight:   168.194 kg (370 lb 12.8 oz)   SpO2: 95% 96% 98% 98%   Patient Vitals for the past 24 hrs:  BP Temp Temp src Pulse Resp SpO2 Height Weight  03/03/13 1535 136/50 mmHg 97.4 F (36.3 C) Oral 59 20 97 % - -  03/03/13 1024 130/54 mmHg - - 64 - - - -  03/03/13 0542 132/51 mmHg 98 F (36.7 C) Oral 62 22 98 % - -  03/03/13 0201 140/51 mmHg 98.1 F (36.7 C) Oral 74 22 98 % 5\' 7"  (1.702 m) 168.194 kg (370 lb 12.8 oz)  03/03/13 0105 138/59 mmHg - - 71 20 96 % - -  03/03/13 0100 - - - 75 - 95 % - -  03/03/13 0045 - - - 69 - 94 % - -  03/03/13 0030 - - - 68 - 93 % - -  03/03/13 0015 - - - 68 - 92 % - -  03/03/13 0000 - - - 68 - 97 % - -  03/02/13 2328 136/56 mmHg - - 68 22 97 % - -  03/02/13 2230 141/58 mmHg - - 69 17 99 % - -  03/02/13 2200 134/52 mmHg - - 67 21  99 % - -  03/02/13 2152 135/52 mmHg 99 F (37.2 C) Oral 70 20 97 % - -  03/02/13 2115 113/59 mmHg - - 67 18 98 % - -  03/02/13 2100 145/60 mmHg - - 67 21 99 % - -  03/02/13 2053 140/50 mmHg - - 69 23 98 % - -  03/02/13 1948 132/49 mmHg 97.9 F (36.6 C) Oral 71 40 95 % - -     Intake/Output Summary (Last 24 hours) at 03/03/13 0733 Last data filed at 03/03/13 0545  Gross per 24 hour  Intake    363 ml  Output   1525 ml  Net  -1162 ml   Filed Weights   03/03/13 0201  Weight: 168.194 kg (370 lb 12.8 oz)    Exam:   General:  axox3  Cardiovascular: rrr  Respiratory: bilat crackles   Abdomen: soft, nt   Musculoskeletal: intact    Data Reviewed: Basic Metabolic  Panel:  Recent Labs Lab 02/25/13 0759 03/02/13 2029  NA 137 133*  K 3.8 4.8  CL  --  88*  CO2  --  36*  GLUCOSE 142* 199*  BUN  --  96*  CREATININE  --  3.49*  CALCIUM  --  8.3*   Liver Function Tests: No results found for this basename: AST, ALT, ALKPHOS, BILITOT, PROT, ALBUMIN,  in the last 168 hours No results found for this basename: LIPASE, AMYLASE,  in the last 168 hours No results found for this basename: AMMONIA,  in the last 168 hours CBC:  Recent Labs Lab 02/25/13 0759 03/02/13 2029  WBC  --  13.2*  HGB 9.5* 8.0*  HCT 28.0* 25.7*  MCV  --  80.6  PLT  --  294   Cardiac Enzymes:  Recent Labs Lab 03/03/13 0013  TROPONINI <0.30   BNP (last 3 results)  Recent Labs  03/02/13 2031  PROBNP 12262.0*   CBG:  Recent Labs Lab 02/25/13 1139 03/03/13 0551  GLUCAP 142* 126*    No results found for this or any previous visit (from the past 240 hour(s)).   Studies: Dg Chest 2 View  03/02/2013  *RADIOLOGY REPORT*  Clinical Data: Shortness of breath.  Fluid on lungs.  CHEST - 2 VIEW  Comparison: 02/19/2013.  Findings: The right IJ central venous catheter has been removed. There is stable cardiomegaly status post median sternotomy and CABG.  Interstitial edema has improved, although  there is evidence of small pleural effusions bilaterally.  There is no confluent airspace opacity or pneumothorax.  IMPRESSION: Cardiomegaly with interstitial pulmonary edema and small bilateral pleural effusions consistent with mild congestive heart failure.   Original Report Authenticated By: Timothy Banks, M.D.     Scheduled Meds: . amLODipine  5 mg Oral QHS  . [START ON 03/04/2013] furosemide  120 mg Intravenous Q8H  . heparin  5,000 Units Subcutaneous Q8H  . insulin aspart  0-15 Units Subcutaneous TID WC  . isosorbide mononitrate  30 mg Oral Daily  . metoprolol  50 mg Oral BID  . simvastatin  20 mg Oral QPM  . sodium chloride  3 mL Intravenous Q12H   Continuous Infusions:   Principal Problem:   Pulmonary edema Active Problems:   Hypervolemia   Type II or unspecified type diabetes mellitus with unspecified complication, uncontrolled   CKD (chronic kidney disease) stage 4, GFR 15-29 ml/min        Timothy Banks  [facility] Pager [phone]. If 7PM-7AM, please contact night-coverage at [email or web address], password Timothy Banks 03/03/2013, 7:33 AM  LOS: 1 day

## 2013-03-03 NOTE — Progress Notes (Signed)
03/03/13 1530 In to complete Heart Failure Home Health Screen.  Pt. states he has had home health before with [facility] and was satisfied with their services.  In addition, pt. has home continuous oxgyen with [facility].  TC to [facility] [phone]) to give referral for [facility] RN for Heart Failure Management.  Physician, please order home health RN and complete face to face documentation.   Tera Mater, RN, BSN NCM [phone]

## 2013-03-03 NOTE — Progress Notes (Signed)
Pt's HR is went up to 150 and then back down to the 40's.  Pt asymptomatic and now sustaining HR in the 60's.  Dr. Lavera Guise text/paged.  Will continue to monitor.

## 2013-03-03 NOTE — H&P (Signed)
[facility] History and Physical  Timothy Banks ZOX:[id number] DOB: 08/31/1956 DOA: 03/02/2013  Referring physician: ED PCP: SUFFRONT,WILFORD, MD  Specialists: None  Chief Complaint: SOB  HPI: Timothy Banks is a 57 y.o. male who presents with acute on chronic SOB.  He has recently been admitted to the hospital on 4/11 and again on 4/15 for the same symptoms determined to be due to fluid retention secondary to CKD stage 4-5.  He had a fistula placed in his RUE on 4/23 which is in the process of maturing so the patient can begin dialysis to treat his renal failure.  He DOES have a history of heart disease including CHF, CAD with prior MI.  He also has a history of DM2.  In the ED today patient was again found to be fluid overloaded with frank pulmonary edema on CXR.  Hospitalist has been asked to admit.  Review of Systems: 12 systems reviewed and otherwise negative.  Past Medical History  Diagnosis Date  . Myocardial infarction   . Diabetes mellitus without complication   . CHF (congestive heart failure)   . Asthma   . Hyperlipidemia   . Hypertension   . Chronic kidney disease 02/17/2013    ACUTE RENAL   Past Surgical History  Procedure Laterality Date  . Right carotid endarterectomy  2002    right side  . Coronary artery bypass graft  2002  . Av fistula placement Right 02/25/2013    Procedure: ARTERIOVENOUS (AV) FISTULA CREATION;  Surgeon: Sherren Kerns, MD;  Location: [facility] OR;  Service: Vascular;  Laterality: Right;  Right Radiocephalic AVF   Social History:  reports that he has been smoking Cigarettes.  He started smoking about 39 years ago. He has a 25 pack-year smoking history. He has never used smokeless tobacco. He reports that he does not drink alcohol or use illicit drugs.   No Known Allergies  Family History  Problem Relation Age of Onset  . Diabetes Mother   . Heart disease Mother   . Diabetes Father   . Heart disease Father     Prior to Admission  medications   Medication Sig Start Date End Date Taking? Authorizing Provider  albuterol (PROVENTIL HFA;VENTOLIN HFA) 108 (90 BASE) MCG/ACT inhaler Inhale 2 puffs into the lungs every 6 (six) hours as needed for wheezing or shortness of breath.   Yes Historical Provider, MD  amLODipine (NORVASC) 5 MG tablet Take 1 tablet (5 mg total) by mouth at bedtime. 02/23/13  Yes [name], MD  furosemide (LASIX) 80 MG tablet Take 2 tablets (160 mg total) by mouth 2 (two) times daily. 02/23/13  Yes [name], MD  glipiZIDE (GLUCOTROL) 10 MG tablet Take 10 mg by mouth 2 (two) times daily before a meal.   Yes Eugenia Pancoast  isosorbide mononitrate (IMDUR) 30 MG 24 hr tablet Take 30 mg by mouth daily.   Yes Historical Provider, MD  metoprolol (LOPRESSOR) 50 MG tablet Take 1 tablet (50 mg total) by mouth 2 (two) times daily. 02/23/13  Yes [name], MD  nitroGLYCERIN (NITROSTAT) 0.4 MG SL tablet Place 0.4 mg under the tongue every 5 (five) minutes as needed for chest pain.   Yes Historical Provider, MD  oxyCODONE-acetaminophen (PERCOCET) 10-325 MG per tablet Take 1 tablet by mouth every 8 (eight) hours as needed for pain.    Yes Eugenia Pancoast  potassium chloride SA (K-DUR,KLOR-CON) 20 MEQ tablet Take 2 tablets (40 mEq total) by mouth daily. 02/23/13  Yes [name], MD  PRESCRIPTION MEDICATION Inject 3 Units into the skin once.   Yes Historical Provider, MD  simvastatin (ZOCOR) 20 MG tablet Take 20 mg by mouth every evening.   Yes Eugenia Pancoast   Physical Exam: Filed Vitals:   03/02/13 2152 03/02/13 2200 03/02/13 2230 03/02/13 2328  BP: 135/52 134/52 141/58 136/56  Pulse: 70 67 69 68  Temp: 99 F (37.2 C)     TempSrc: Oral     Resp: 20 21 17 22   SpO2: 97% 99% 99% 97%    General:  NAD, resting comfortably in bed Eyes: PEERLA EOMI ENT: mucous membranes moist Neck: supple w/o JVD Cardiovascular: RRR w/o MRG Respiratory: rhonchi in B lung fields. Abdomen: soft, nt, morbidly obese,  bs+ Skin: no rash nor lesion Musculoskeletal: MAE, full ROM all 4 extremities, 3+ pitting edema BLE Psychiatric: normal tone and affect Neurologic: AAOx3, grossly non-focal  Labs on Admission:  Basic Metabolic Panel:  Recent Labs Lab 02/25/13 0759 03/02/13 2029  NA 137 133*  K 3.8 4.8  CL  --  88*  CO2  --  36*  GLUCOSE 142* 199*  BUN  --  96*  CREATININE  --  3.49*  CALCIUM  --  8.3*   Liver Function Tests: No results found for this basename: AST, ALT, ALKPHOS, BILITOT, PROT, ALBUMIN,  in the last 168 hours No results found for this basename: LIPASE, AMYLASE,  in the last 168 hours No results found for this basename: AMMONIA,  in the last 168 hours CBC:  Recent Labs Lab 02/25/13 0759 03/02/13 2029  WBC  --  13.2*  HGB 9.5* 8.0*  HCT 28.0* 25.7*  MCV  --  80.6  PLT  --  294   Cardiac Enzymes: No results found for this basename: CKTOTAL, CKMB, CKMBINDEX, TROPONINI,  in the last 168 hours  BNP (last 3 results)  Recent Labs  03/02/13 2031  PROBNP 12262.0*   CBG:  Recent Labs Lab 02/25/13 1139  GLUCAP 142*    Radiological Exams on Admission: Dg Chest 2 View  03/02/2013  *RADIOLOGY REPORT*  Clinical Data: Shortness of breath.  Fluid on lungs.  CHEST - 2 VIEW  Comparison: 02/19/2013.  Findings: The right IJ central venous catheter has been removed. There is stable cardiomegaly status post median sternotomy and CABG.  Interstitial edema has improved, although there is evidence of small pleural effusions bilaterally.  There is no confluent airspace opacity or pneumothorax.  IMPRESSION: Cardiomegaly with interstitial pulmonary edema and small bilateral pleural effusions consistent with mild congestive heart failure.   Original Report Authenticated By: Carey Bullocks, M.D.     EKG: Independently reviewed.  Assessment/Plan Principal Problem:   Pulmonary edema Active Problems:   Hypervolemia   Type II or unspecified type diabetes mellitus with unspecified  complication, uncontrolled   CKD (chronic kidney disease) stage 4, GFR 15-29 ml/min   1. Pulmonary edema - Acute on chronic CHF, EF not yet determined, ordering 2d Echo.  Strongly suspect that this is all secondary to fluid overload due to his CKD as opposed to a new primary cardiac process going on.  Cannot give ACEi due to renal insufficiency.  Dialysis planned in the near future for fluid overload though nephrology is going to try and hold off for as long as possible to give his fistula time to mature (was just put in on the 23rd).  Have ordered lasix 120mg  IV q8h to see if this helps though given that he  has failed lasix 160 BID I am somewhat concerned that he may not respond.  Will of course order serial troponins (first trop negative in ED), in case this represents ACS but again, given his recent history (3rd hospitalization for same complaints this month), I doubt it. 2. DM2 - holding home glipizide and using med dose SSI of insulin for now AC/HS  Dr. Hyman Hopes called by ED for consultation on the patient, no dialysis planned for tonight, he wants patient on Lasix 120mg  TID dosing.  Code Status: Full Code (must indicate code status--if unknown or must be presumed, indicate so) Family Communication: Spoke with wife at bedside (indicate person spoken with, if applicable, with phone number if by telephone) Disposition Plan: Admit to inpatient (indicate anticipated LOS)  Time spent: 70 min  GARDNER, JARED M. [facility] Pager [phone]  If 7PM-7AM, please contact night-coverage [email or web address] Password [facility] 03/03/2013, 12:11 AM

## 2013-03-03 NOTE — Consult Note (Signed)
Reason for Consult:advanced CKD and refractory CHF/resistance to diuretics Referring Physician: Severiano Utsey is an 57 y.o. male.  HPI: Pt is a 56yo WM with PMH sig for CAD, CHF, DM, obestiy, and CKD stage 4 who was just discharged 1 week ago for decompensated CHF and AKI/CKD.  He was seen in consultation on 02/17/13 by Dr. Lowell Guitar.  Please see that dictation for further details.  Since his discharge, he has undergone AVF creation of his right forearm and has had increasing SOB, lower ext edema, and DOE/orthopnea.    Trend in Creatinine: Creatinine, Ser  Date/Time Value Range Status  03/02/2013  9:06 PM 3.20* 0.50 - 1.35 mg/dL Final  4/54/0981  1:91 PM 3.49* 0.50 - 1.35 mg/dL Final  4/78/2956  2:13 AM 3.37* 0.50 - 1.35 mg/dL Final  0/86/5784  6:96 AM 3.62* 0.50 - 1.35 mg/dL Final  2/95/2841  3:24 AM 3.99* 0.50 - 1.35 mg/dL Final  02/04/271  5:36 AM 4.35* 0.50 - 1.35 mg/dL Final  6/44/0347 42:59 AM 4.37* 0.50 - 1.35 mg/dL Final    PMH:   Past Medical History  Diagnosis Date  . Myocardial infarction   . Diabetes mellitus without complication   . CHF (congestive heart failure)   . Asthma   . Hyperlipidemia   . Hypertension   . Chronic kidney disease 02/17/2013    ACUTE RENAL    PSH:   Past Surgical History  Procedure Laterality Date  . Right carotid endarterectomy  2002    right side  . Coronary artery bypass graft  2002  . Av fistula placement Right 02/25/2013    Procedure: ARTERIOVENOUS (AV) FISTULA CREATION;  Surgeon: Sherren Kerns, MD;  Location: [facility] OR;  Service: Vascular;  Laterality: Right;  Right Radiocephalic AVF    Allergies: No Known Allergies  Medications:   Prior to Admission medications   Medication Sig Start Date End Date Taking? Authorizing Provider  albuterol (PROVENTIL HFA;VENTOLIN HFA) 108 (90 BASE) MCG/ACT inhaler Inhale 2 puffs into the lungs every 6 (six) hours as needed for wheezing or shortness of breath.   Yes Historical Provider, MD  amLODipine  (NORVASC) 5 MG tablet Take 1 tablet (5 mg total) by mouth at bedtime. 02/23/13  Yes [name], MD  furosemide (LASIX) 80 MG tablet Take 2 tablets (160 mg total) by mouth 2 (two) times daily. 02/23/13  Yes [name], MD  glipiZIDE (GLUCOTROL) 10 MG tablet Take 10 mg by mouth 2 (two) times daily before a meal.   Yes Eugenia Pancoast  isosorbide mononitrate (IMDUR) 30 MG 24 hr tablet Take 30 mg by mouth daily.   Yes Historical Provider, MD  metoprolol (LOPRESSOR) 50 MG tablet Take 1 tablet (50 mg total) by mouth 2 (two) times daily. 02/23/13  Yes [name], MD  nitroGLYCERIN (NITROSTAT) 0.4 MG SL tablet Place 0.4 mg under the tongue every 5 (five) minutes as needed for chest pain.   Yes Historical Provider, MD  oxyCODONE-acetaminophen (PERCOCET) 10-325 MG per tablet Take 1 tablet by mouth every 8 (eight) hours as needed for pain.    Yes Eugenia Pancoast  potassium chloride SA (K-DUR,KLOR-CON) 20 MEQ tablet Take 2 tablets (40 mEq total) by mouth daily. 02/23/13  Yes [name], MD  PRESCRIPTION MEDICATION Inject 3 Units into the skin once.   Yes Historical Provider, MD  simvastatin (ZOCOR) 20 MG tablet Take 20 mg by mouth every evening.   Yes Eugenia Pancoast    Inpatient medications: .  amLODipine  5 mg Oral QHS  . [START ON 03/04/2013] furosemide  120 mg Intravenous Q8H  . heparin  5,000 Units Subcutaneous Q8H  . insulin aspart  0-15 Units Subcutaneous TID WC  . isosorbide mononitrate  30 mg Oral Daily  . metoprolol  50 mg Oral BID  . simvastatin  20 mg Oral QPM  . sodium chloride  3 mL Intravenous Q12H    Discontinued Meds:   Medications Discontinued During This Encounter  Medication Reason  . oxyCODONE (OXY IR/ROXICODONE) 5 MG immediate release tablet Inpatient Standard  . oxyCODONE-acetaminophen (PERCOCET) 10-325 MG per tablet 1 tablet Formulary change    Social History:  reports that he has quit smoking. He started smoking about 39 years ago. He has never used smokeless  tobacco. He reports that he does not drink alcohol or use illicit drugs.  Family History:   Family History  Problem Relation Age of Onset  . Diabetes Mother   . Heart disease Mother   . Diabetes Father   . Heart disease Father     Pertinent items are noted in HPI. Weight change:   Intake/Output Summary (Last 24 hours) at 03/03/13 1243 Last data filed at 03/03/13 1120  Gross per 24 hour  Intake    603 ml  Output   2100 ml  Net  -1497 ml    General appearance: alert, cooperative and morbidly obese sitting upright in chair wearing nasal 02 Head: Normocephalic, without obvious abnormality, atraumatic Eyes: negative findings: lids and lashes normal, conjunctivae and sclerae normal and corneas clear Neck: no adenopathy, no carotid bruit, supple, symmetrical, trachea midline and large neck without masses, unable to evaluate JVD Resp: rales bilaterally Cardio: no rub GI: soft, non-tender; bowel sounds normal; no masses,  no organomegaly Extremities: edema 1+pretibial edema and right wrist AVF +T/B  Labs: Basic Metabolic Panel:  Recent Labs Lab 02/25/13 0759 03/02/13 2029 03/02/13 2106  NA 137 133* 134*  K 3.8 4.8 4.7  CL  --  88* 86*  CO2  --  36*  --   GLUCOSE 142* 199* 201*  BUN  --  96* 116*  CREATININE  --  3.49* 3.20*  CALCIUM  --  8.3*  --    Liver Function Tests: No results found for this basename: AST, ALT, ALKPHOS, BILITOT, PROT, ALBUMIN,  in the last 168 hours No results found for this basename: LIPASE, AMYLASE,  in the last 168 hours No results found for this basename: AMMONIA,  in the last 168 hours CBC:  Recent Labs Lab 02/25/13 0759 03/02/13 2029 03/02/13 2106  WBC  --  13.2*  --   HGB 9.5* 8.0* 9.5*  HCT 28.0* 25.7* 28.0*  MCV  --  80.6  --   PLT  --  294  --    PT/INR: @labrcntip (inr:5) Cardiac Enzymes:  Recent Labs Lab 03/03/13 0013 03/03/13 0633  TROPONINI <0.30 <0.30   CBG:  Recent Labs Lab 02/25/13 1139 03/03/13 0551  GLUCAP  142* 126*    Iron Studies: No results found for this basename: IRON, TIBC, TRANSFERRIN, FERRITIN,  in the last 168 hours  Xrays/Other Studies: Dg Chest 2 View  03/02/2013  *RADIOLOGY REPORT*  Clinical Data: Shortness of breath.  Fluid on lungs.  CHEST - 2 VIEW  Comparison: 02/19/2013.  Findings: The right IJ central venous catheter has been removed. There is stable cardiomegaly status post median sternotomy and CABG.  Interstitial edema has improved, although there is evidence of small pleural effusions bilaterally.  There is no confluent airspace opacity or pneumothorax.  IMPRESSION: Cardiomegaly with interstitial pulmonary edema and small bilateral pleural effusions consistent with mild congestive heart failure.   Original Report Authenticated By: Carey Bullocks, M.D.      Assessment/Plan: 1.  Acute on chronic CHF.  Pt is responding to diuretics.  Reports improved breathing with nasal O2.  "The oxygen is better here than at home".  Pt weighed 172kg when he was discharged a week ago and now weighs 168kg.  ?worsening symptoms with improved volume status.  Agree with 2D ECHO.  May be other source of his symptoms.  Will cont to follow on his current dose of Lasix.  (he had been on 200mg  q6) 2. AKI/CKD- Scr is actually better than when he left a week ago although his BUN is higher.  No uremic symptoms, however pt is willing to go on HD to help with his volume and SOB if needed.  Will cont to follow.   3. ACDz- may be part of explanation for his worsening DOE/SOB.  May need a blood transfusion if it cont to fall.  Will resume EPO and check iron stores. 4. DM- per primary service 5. Vascular access- +T/B of AVF placed 02/23/13. F/u with Dr. Darrick Penna 6. Obesity- ?Obesity hypoventillation syndrome.  May need sleep study 7. HTN- stable 8. Dispo- d/c to home when stable.   Arjun Hard A 03/03/2013, 12:43 PM

## 2013-03-03 NOTE — Progress Notes (Signed)
Thank you to [facility]von[facility] for this referral.  Chart review complete.  Patient is not eligible for [facility] Care Management services because his/her PCP is not a [facility] primary care provider or is not [facility] affiliated.  For any additional questions or new referrals please contact Anibal Henderson BSN RN [facility] Liaison at [phone]

## 2013-03-04 DIAGNOSIS — J9600 Acute respiratory failure, unspecified whether with hypoxia or hypercapnia: Secondary | ICD-10-CM

## 2013-03-04 DIAGNOSIS — I517 Cardiomegaly: Secondary | ICD-10-CM

## 2013-03-04 DIAGNOSIS — N179 Acute kidney failure, unspecified: Secondary | ICD-10-CM

## 2013-03-04 LAB — RENAL FUNCTION PANEL
Calcium: 8.4 mg/dL (ref 8.4–10.5)
Creatinine, Ser: 3.26 mg/dL — ABNORMAL HIGH (ref 0.50–1.35)
GFR calc Af Amer: 23 mL/min — ABNORMAL LOW (ref >90)
GFR calc non Af Amer: 20 mL/min — ABNORMAL LOW (ref >90)
Phosphorus: 3.9 mg/dL (ref 2.3–4.6)
Sodium: 135 mEq/L (ref 135–145)

## 2013-03-04 LAB — CBC
MCH: 24.8 pg — ABNORMAL LOW (ref 26.0–34.0)
MCHC: 29.7 g/dL — ABNORMAL LOW (ref 30.0–36.0)
Platelets: 284 10*3/uL (ref 150–400)

## 2013-03-04 LAB — GLUCOSE, CAPILLARY
Glucose-Capillary: 152 mg/dL — ABNORMAL HIGH (ref 70–99)
Glucose-Capillary: 210 mg/dL — ABNORMAL HIGH (ref 70–99)

## 2013-03-04 LAB — PREPARE RBC (CROSSMATCH)

## 2013-03-04 MED ORDER — ALPRAZOLAM 0.5 MG PO TABS
1.0000 mg | ORAL_TABLET | Freq: Once | ORAL | Status: AC
  Administered 2013-03-04: 1 mg via ORAL
  Filled 2013-03-04: qty 2

## 2013-03-04 MED ORDER — FUROSEMIDE 10 MG/ML IJ SOLN
40.0000 mg | Freq: Once | INTRAMUSCULAR | Status: AC
  Administered 2013-03-04: 40 mg via INTRAVENOUS
  Filled 2013-03-04: qty 4

## 2013-03-04 NOTE — Progress Notes (Signed)
[facility] PROGRESS NOTE  Timothy Banks:[id number] DOB: 08/24/1956 DOA: 03/02/2013 PCP: Timothy Salk, MD  Assessment/Plan: 1. Acute on chronic CHF. Echocardiogram pending. Pt is responding to diuretics. Nephrology is on board.  Will cont to follow.  Current lasix dose 200mg  q6  CKD stage 4-  - Nephrology on board. Currently no plans for HD  CAD s/p remote CABG  - continue with BB, Imdur and statin  - Stable  Anemia of CKD - stable Hb without bleed. Nephrology recommending 1 unit of PRBC.  Will place order and follow with lasix dose as per their recommendations. - Recheck cbc next am.  DM type 2 - SSI.  Off Glipizide for now.. Started Levemir at 5 units at bedtime. - relatively well controlled on this regimen.   Code Status: full Family Communication: wife  Disposition Plan: home   Consultants:  Nephrology   Procedures:  none  HPI/Subjective: No new complaints, No acute issues reported to me by patient.  Objective: Filed Vitals:   03/03/13 2132 03/04/13 0103 03/04/13 0602 03/04/13 1030  BP: 135/119 130/45 122/40 128/56  Pulse: 62 59 55 60  Temp: 98.5 F (36.9 C)  97.4 F (36.3 C)   TempSrc: Oral  Oral   Resp: 20 18 18    Height:      Weight:   167.604 kg (369 lb 8 oz)   SpO2: 98% 91% 98%    Patient Vitals for the past 24 hrs:  BP Temp Temp src Pulse Resp SpO2 Weight  03/04/13 1030 128/56 mmHg - - 60 - - -  03/04/13 0602 122/40 mmHg 97.4 F (36.3 C) Oral 55 18 98 % 167.604 kg (369 lb 8 oz)  03/04/13 0103 130/45 mmHg - - 59 18 91 % -  03/03/13 2132 135/119 mmHg 98.5 F (36.9 C) Oral 62 20 98 % -  03/03/13 1535 136/50 mmHg 97.4 F (36.3 C) Oral 59 20 97 % -     Intake/Output Summary (Last 24 hours) at 03/04/13 1304 Last data filed at 03/04/13 1102  Gross per 24 hour  Intake   1206 ml  Output   2250 ml  Net  -1044 ml   Filed Weights   03/03/13 0201 03/04/13 0602  Weight: 168.194 kg (370 lb 12.8 oz) 167.604 kg (369 lb 8 oz)     Exam:   General:  axox3  Cardiovascular: rrr, no mrg  Respiratory: bilat crackles , no wheezes  Abdomen: soft, nt   Musculoskeletal: intact    Data Reviewed: Basic Metabolic Panel:  Recent Labs Lab 03/02/13 2029 03/02/13 2106 03/04/13 0440  NA 133* 134* 135  K 4.8 4.7 4.2  CL 88* 86* 91*  CO2 36*  --  33*  GLUCOSE 199* 201* 138*  BUN 96* 116* 96*  CREATININE 3.49* 3.20* 3.26*  CALCIUM 8.3*  --  8.4  PHOS  --   --  3.9   Liver Function Tests:  Recent Labs Lab 03/04/13 0440  ALBUMIN 2.9*   No results found for this basename: LIPASE, AMYLASE,  in the last 168 hours No results found for this basename: AMMONIA,  in the last 168 hours CBC:  Recent Labs Lab 03/02/13 2029 03/02/13 2106 03/04/13 0440  WBC 13.2*  --  12.0*  HGB 8.0* 9.5* 7.8*  HCT 25.7* 28.0* 26.3*  MCV 80.6  --  83.8  PLT 294  --  284   Cardiac Enzymes:  Recent Labs Lab 03/03/13 0013 03/03/13 0633 03/03/13 1144  TROPONINI <0.30 <0.30 <  0.30   BNP (last 3 results)  Recent Labs  03/02/13 2031  PROBNP 12262.0*   CBG:  Recent Labs Lab 03/03/13 0551 03/03/13 1119 03/03/13 1616 03/03/13 2120 03/04/13 0611  GLUCAP 126* 102* 198* 198* 155*    No results found for this or any previous visit (from the past 240 hour(s)).   Studies: Dg Chest 2 View  03/02/2013  *RADIOLOGY REPORT*  Clinical Data: Shortness of breath.  Fluid on lungs.  CHEST - 2 VIEW  Comparison: 02/19/2013.  Findings: The right IJ central venous catheter has been removed. There is stable cardiomegaly status post median sternotomy and CABG.  Interstitial edema has improved, although there is evidence of small pleural effusions bilaterally.  There is no confluent airspace opacity or pneumothorax.  IMPRESSION: Cardiomegaly with interstitial pulmonary edema and small bilateral pleural effusions consistent with mild congestive heart failure.   Original Report Authenticated By: Timothy Banks, M.D.     Scheduled  Meds: . amLODipine  5 mg Oral QHS  . darbepoetin (ARANESP) injection - NON-DIALYSIS  150 mcg Subcutaneous Q Wed-1800  . furosemide  120 mg Intravenous Q8H  . heparin  5,000 Units Subcutaneous Q8H  . insulin aspart  0-15 Units Subcutaneous TID WC  . insulin detemir  5 Units Subcutaneous QHS  . isosorbide mononitrate  30 mg Oral Daily  . metoprolol  50 mg Oral BID  . simvastatin  20 mg Oral QPM  . sodium chloride  3 mL Intravenous Q12H   Continuous Infusions:   Principal Problem:   Pulmonary edema Active Problems:   Hypervolemia   Type II or unspecified type diabetes mellitus with unspecified complication, uncontrolled   CKD (chronic kidney disease) stage 4, GFR 15-29 ml/min        Timothy Banks  [facility] Pager [phone]. If 7PM-7AM, please contact night-coverage at [email or web address], password [facility] 03/04/2013, 1:04 PM  LOS: 2 days

## 2013-03-04 NOTE — Plan of Care (Signed)
Problem: Phase I Progression Outcomes Goal: Voiding-avoid urinary catheter unless indicated Outcome: Completed/Met Date Met:  03/04/13 Patient uses urinal

## 2013-03-04 NOTE — Progress Notes (Signed)
  Echocardiogram 2D Echocardiogram has been performed.  Ellender Hose A 03/04/2013, 3:12 PM

## 2013-03-04 NOTE — Progress Notes (Signed)
Patient ID: Timothy Banks, male   DOB: 1955/12/12, 57 y.o.   MRN: [id number] S:still with SOB O:BP 122/40  Pulse 55  Temp(Src) 97.4 F (36.3 C) (Oral)  Resp 18  Ht 5\' 7"  (1.702 m)  Wt 167.604 kg (369 lb 8 oz)  BMI 57.86 kg/m2  SpO2 98%  Intake/Output Summary (Last 24 hours) at 03/04/13 1000 Last data filed at 03/04/13 0800  Gross per 24 hour  Intake   1446 ml  Output   2250 ml  Net   -804 ml   Intake/Output: I/O last 3 completed shifts: In: 1689 [P.O.:1680; I.V.:9] Out: 3725 [Urine:3725]  Intake/Output this shift:  Total I/O In: 360 [P.O.:360] Out: 325 [Urine:325] Weight change: -0.59 kg (-1 lb 4.8 oz) Gen:WD obese WM in NAD GEX:BMWUX HS, no rub Resp:decreased BS at bases o/w CTA LKG:MWNUU/VOZDGUYQI Ext:1+edema, LFA AVF +T/B   Recent Labs Lab 03/02/13 2029 03/02/13 2106 03/04/13 0440  NA 133* 134* 135  K 4.8 4.7 4.2  CL 88* 86* 91*  CO2 36*  --  33*  GLUCOSE 199* 201* 138*  BUN 96* 116* 96*  CREATININE 3.49* 3.20* 3.26*  ALBUMIN  --   --  2.9*  CALCIUM 8.3*  --  8.4  PHOS  --   --  3.9   Liver Function Tests:  Recent Labs Lab 03/04/13 0440  ALBUMIN 2.9*   No results found for this basename: LIPASE, AMYLASE,  in the last 168 hours No results found for this basename: AMMONIA,  in the last 168 hours CBC:  Recent Labs Lab 03/02/13 2029 03/02/13 2106 03/04/13 0440  WBC 13.2*  --  12.0*  HGB 8.0* 9.5* 7.8*  HCT 25.7* 28.0* 26.3*  MCV 80.6  --  83.8  PLT 294  --  284   Cardiac Enzymes:  Recent Labs Lab 03/03/13 0013 03/03/13 0633 03/03/13 1144  TROPONINI <0.30 <0.30 <0.30   CBG:  Recent Labs Lab 03/03/13 0551 03/03/13 1119 03/03/13 1616 03/03/13 2120 03/04/13 0611  GLUCAP 126* 102* 198* 198* 155*    Iron Studies: No results found for this basename: IRON, TIBC, TRANSFERRIN, FERRITIN,  in the last 72 hours Studies/Results: Dg Chest 2 View  03/02/2013  *RADIOLOGY REPORT*  Clinical Data: Shortness of breath.  Fluid on lungs.  CHEST -  2 VIEW  Comparison: 02/19/2013.  Findings: The right IJ central venous catheter has been removed. There is stable cardiomegaly status post median sternotomy and CABG.  Interstitial edema has improved, although there is evidence of small pleural effusions bilaterally.  There is no confluent airspace opacity or pneumothorax.  IMPRESSION: Cardiomegaly with interstitial pulmonary edema and small bilateral pleural effusions consistent with mild congestive heart failure.   Original Report Authenticated By: Carey Bullocks, M.D.    . amLODipine  5 mg Oral QHS  . darbepoetin (ARANESP) injection - NON-DIALYSIS  150 mcg Subcutaneous Q Wed-1800  . furosemide  120 mg Intravenous Q8H  . heparin  5,000 Units Subcutaneous Q8H  . insulin aspart  0-15 Units Subcutaneous TID WC  . insulin detemir  5 Units Subcutaneous QHS  . isosorbide mononitrate  30 mg Oral Daily  . metoprolol  50 mg Oral BID  . simvastatin  20 mg Oral QPM  . sodium chloride  3 mL Intravenous Q12H    BMET    Component Value Date/Time   NA 135 03/04/2013 0440   K 4.2 03/04/2013 0440   CL 91* 03/04/2013 0440   CO2 33* 03/04/2013 0440   GLUCOSE  138* 03/04/2013 0440   BUN 96* 03/04/2013 0440   CREATININE 3.26* 03/04/2013 0440   CALCIUM 8.4 03/04/2013 0440   GFRNONAA 20* 03/04/2013 0440   GFRAA 23* 03/04/2013 0440   CBC    Component Value Date/Time   WBC 12.0* 03/04/2013 0440   RBC 3.14* 03/04/2013 0440   HGB 7.8* 03/04/2013 0440   HCT 26.3* 03/04/2013 0440   PLT 284 03/04/2013 0440   MCV 83.8 03/04/2013 0440   MCH 24.8* 03/04/2013 0440   MCHC 29.7* 03/04/2013 0440   RDW 17.9* 03/04/2013 0440   LYMPHSABS 0.7 02/17/2013 1130   MONOABS 0.9 02/17/2013 1130   EOSABS 0.4 02/17/2013 1130   BASOSABS 0.0 02/17/2013 1130     Assessment/Plan:  1. Acute on chronic CHF. Pt is responding to diuretics. Pt with worsening symptoms with improved volume status. Agree with 2D ECHO, current results are still pending. May be other source of his symptoms such as  obesity hypoventillation syndrome.  He might benefit from sleep study given his high pre-test probability. Will cont to follow on his current dose of Lasix. (he had been on 200mg  q6) 2. AKI/CKD- Scr is actually better than when he left a week ago although his BUN is higher. No uremic symptoms, however pt is willing to go on HD to help with his volume and SOB if needed. Will cont to follow but currently no indication for HD 3. ACDz- may be part of explanation for his worsening DOE/SOB. May need a blood transfusion if it cont to fall. Will resume EPO and check iron stores.  Now below 8.  Would recommend 1 unit PRBC's followed by lasix to see if this helps his symptoms 4. DM- per primary service 5. Vascular access- +T/B of AVF placed 02/23/13. F/u with Dr. Darrick Penna 6. Obesity- ?Obesity hypoventillation syndrome. May need sleep study 7. HTN- stable 8. Dispo- d/c to home when stable. 9.   Timothy Banks A

## 2013-03-05 LAB — TYPE AND SCREEN
ABO/RH(D): A POS
Antibody Screen: NEGATIVE
Unit division: 0

## 2013-03-05 LAB — RENAL FUNCTION PANEL
Albumin: 2.9 g/dL — ABNORMAL LOW (ref 3.5–5.2)
BUN: 87 mg/dL — ABNORMAL HIGH (ref 6–23)
CO2: 35 mEq/L — ABNORMAL HIGH (ref 19–32)
Calcium: 8.5 mg/dL (ref 8.4–10.5)
Chloride: 90 mEq/L — ABNORMAL LOW (ref 96–112)
Creatinine, Ser: 2.99 mg/dL — ABNORMAL HIGH (ref 0.50–1.35)
GFR calc Af Amer: 25 mL/min — ABNORMAL LOW (ref >90)
GFR calc non Af Amer: 22 mL/min — ABNORMAL LOW (ref >90)
Glucose, Bld: 175 mg/dL — ABNORMAL HIGH (ref 70–99)
Phosphorus: 4.4 mg/dL (ref 2.3–4.6)
Potassium: 4 mEq/L (ref 3.5–5.1)
Sodium: 135 mEq/L (ref 135–145)

## 2013-03-05 LAB — CBC
HCT: 27.8 % — ABNORMAL LOW (ref 39.0–52.0)
Hemoglobin: 8.6 g/dL — ABNORMAL LOW (ref 13.0–17.0)
MCHC: 30.9 g/dL (ref 30.0–36.0)

## 2013-03-05 LAB — PROTEIN, URINE, 24 HOUR
Collection Interval-UPROT: 24 hours
Protein, 24H Urine: 3497 mg/d — ABNORMAL HIGH (ref 50–100)
Protein, Urine: 111 mg/dL
Urine Total Volume-UPROT: 3150 mL

## 2013-03-05 LAB — GLUCOSE, CAPILLARY: Glucose-Capillary: 249 mg/dL — ABNORMAL HIGH (ref 70–99)

## 2013-03-05 MED ORDER — DARBEPOETIN ALFA-POLYSORBATE 150 MCG/0.3ML IJ SOLN
150.0000 ug | INTRAMUSCULAR | Status: DC
  Administered 2013-03-05: 150 ug via SUBCUTANEOUS
  Filled 2013-03-05: qty 0.3

## 2013-03-05 MED ORDER — FUROSEMIDE 80 MG PO TABS
160.0000 mg | ORAL_TABLET | Freq: Two times a day (BID) | ORAL | Status: DC
  Administered 2013-03-05 – 2013-03-06 (×2): 160 mg via ORAL
  Filled 2013-03-05 (×4): qty 2

## 2013-03-05 MED ORDER — ALPRAZOLAM 0.5 MG PO TABS
1.0000 mg | ORAL_TABLET | Freq: Every evening | ORAL | Status: DC | PRN
  Filled 2013-03-05: qty 2

## 2013-03-05 NOTE — Progress Notes (Signed)
Patient ID: Timothy Banks, male   DOB: 14-Feb-1956, 57 y.o.   MRN: [id number] S:feels much better today.  "I am ready to go home" O:BP 137/60  Pulse 61  Temp(Src) 97.6 F (36.4 C) (Oral)  Resp 20  Ht 5\' 7"  (1.702 m)  Wt 167.332 kg (368 lb 14.4 oz)  BMI 57.76 kg/m2  SpO2 95%  Intake/Output Summary (Last 24 hours) at 03/05/13 1056 Last data filed at 03/05/13 0825  Gross per 24 hour  Intake   1859 ml  Output   2975 ml  Net  -1116 ml   Intake/Output: I/O last 3 completed shifts: In: 2647 [P.O.:2040; I.V.:359; IV Piggyback:248] Out: 4000 [Urine:4000]  Intake/Output this shift:  Total I/O In: 240 [P.O.:240] Out: 300 [Urine:300] Weight change: -0.272 kg (-9.6 oz) Gen:WD WN WM in NAD CVS:no rub Resp:decreased BS at bases NUU:VOZDG/UYQIHKVQQ Ext:1+edema, Right FA AVF +T/B   Recent Labs Lab 03/02/13 2029 03/02/13 2106 03/04/13 0440 03/05/13 0440  NA 133* 134* 135 135  K 4.8 4.7 4.2 4.0  CL 88* 86* 91* 90*  CO2 36*  --  33* 35*  GLUCOSE 199* 201* 138* 175*  BUN 96* 116* 96* 87*  CREATININE 3.49* 3.20* 3.26* 2.99*  ALBUMIN  --   --  2.9* 2.9*  CALCIUM 8.3*  --  8.4 8.5  PHOS  --   --  3.9 4.4   Liver Function Tests:  Recent Labs Lab 03/04/13 0440 03/05/13 0440  ALBUMIN 2.9* 2.9*   No results found for this basename: LIPASE, AMYLASE,  in the last 168 hours No results found for this basename: AMMONIA,  in the last 168 hours CBC:  Recent Labs Lab 03/02/13 2029 03/02/13 2106 03/04/13 0440 03/04/13 2319  WBC 13.2*  --  12.0* 11.5*  HGB 8.0* 9.5* 7.8* 8.6*  HCT 25.7* 28.0* 26.3* 27.8*  MCV 80.6  --  83.8 83.2  PLT 294  --  284 268   Cardiac Enzymes:  Recent Labs Lab 03/03/13 0013 03/03/13 0633 03/03/13 1144  TROPONINI <0.30 <0.30 <0.30   CBG:  Recent Labs Lab 03/04/13 0611 03/04/13 1128 03/04/13 1615 03/04/13 2136 03/05/13 0600  GLUCAP 155* 152* 210* 272* 150*    Iron Studies: No results found for this basename: IRON, TIBC, TRANSFERRIN,  FERRITIN,  in the last 72 hours Studies/Results: No results found. Marland Kitchen amLODipine  5 mg Oral QHS  . darbepoetin (ARANESP) injection - NON-DIALYSIS  150 mcg Subcutaneous Q Wed-1800  . furosemide  120 mg Intravenous Q8H  . heparin  5,000 Units Subcutaneous Q8H  . insulin aspart  0-15 Units Subcutaneous TID WC  . insulin detemir  5 Units Subcutaneous QHS  . isosorbide mononitrate  30 mg Oral Daily  . metoprolol  50 mg Oral BID  . simvastatin  20 mg Oral QPM  . sodium chloride  3 mL Intravenous Q12H    BMET    Component Value Date/Time   NA 135 03/05/2013 0440   K 4.0 03/05/2013 0440   CL 90* 03/05/2013 0440   CO2 35* 03/05/2013 0440   GLUCOSE 175* 03/05/2013 0440   BUN 87* 03/05/2013 0440   CREATININE 2.99* 03/05/2013 0440   CALCIUM 8.5 03/05/2013 0440   GFRNONAA 22* 03/05/2013 0440   GFRAA 25* 03/05/2013 0440   CBC    Component Value Date/Time   WBC 11.5* 03/04/2013 2319   RBC 3.34* 03/04/2013 2319   HGB 8.6* 03/04/2013 2319   HCT 27.8* 03/04/2013 2319   PLT 268 03/04/2013 2319  MCV 83.2 03/04/2013 2319   MCH 25.7* 03/04/2013 2319   MCHC 30.9 03/04/2013 2319   RDW 17.6* 03/04/2013 2319   LYMPHSABS 0.7 02/17/2013 1130   MONOABS 0.9 02/17/2013 1130   EOSABS 0.4 02/17/2013 1130   BASOSABS 0.0 02/17/2013 1130     Assessment/Plan:  1. Acute on chronic CHF. Pt is responding to diuretics. Will switch to po Lasix and see how he does.   If he responds, may be stable for d/c.  Await 2D ECHO report.  May be other source of his symptoms such as obesity hypoventillation syndrome. He might benefit from sleep study as an outpt given his high pre-test probability.  2. AKI/CKD- Scr continues to improve with diuresis and is actually better than when he left [place] week ago. No uremic symptoms, however pt is willing to go on HD to help with his volume and SOB if needed. Will cont to follow but currently no indication for HD 3. ACDz- may be part of explanation for his worsening DOE/SOB. Improved symptoms after blood  transfusion.  Cont with outpt EPO 4. DM- per primary service 5. Vascular access- +T/B of AVF placed 02/23/13. F/u with Dr. Darrick Penna 6. Obesity- ?Obesity hypoventillation syndrome. May need sleep study 7. HTN- stable 8. Dispo- d/c to home hopefully tomorrow if he responds to oral lasix.  F/u with Dr. Lowell Guitar Mar 27, 2013 at 8:30am.   9.   Timothy Banks

## 2013-03-05 NOTE — Plan of Care (Signed)
Problem: Phase I Progression Outcomes Goal: EF % per last Echo/documented,Core Reminder form on chart EF per last echo on 03/04/13 showed an EF of 50-55%.

## 2013-03-05 NOTE — Progress Notes (Signed)
[facility] PROGRESS NOTE  Timothy Banks WUJ:[id number] DOB: Jul 19, 1956 DOA: 03/02/2013 PCP: Timothy Salk, MD  Assessment/Plan: 1. Acute on chronic CHF Most likely diastolic based on echocardiogram. Echocardiogram pending. Pt is responding to diuretics. Nephrology is on board. Plan is to change lasix to oral form and if patient tolerating well will plan on discharging next am. Echocardiogram shows EF of 50-55 percent with no regional wall motion abnormality. Grade 2 diastolic dysfunction.  CKD stage 4-  - Nephrology on board. Currently no plans for HD  CAD s/p remote CABG  - continue with BB, Imdur and statin  - Stable  Anemia of CKD - stable Hb without bleed. Nephrology recommending 1 unit of PRBC.  Will place order and follow with lasix dose as per their recommendations. - Recheck cbc next am.  DM type 2 - SSI.  Off Glipizide for now.. Started Levemir at 5 units at bedtime. - relatively well controlled on this regimen.   Code Status: full Family Communication: wife  Disposition Plan: home   Consultants:  Nephrology   Procedures:  none  HPI/Subjective: No new complaints, No acute issues reported to me by patient.  Objective: Filed Vitals:   03/04/13 2015 03/05/13 0500 03/05/13 0538 03/05/13 1335  BP: 148/62  137/60 130/44  Pulse: 64  61 57  Temp: 98.6 F (37 C)  97.6 F (36.4 C) 97.7 F (36.5 C)  TempSrc: Oral  Oral Oral  Resp: 20  20 20   Height:      Weight:  167.332 kg (368 lb 14.4 oz) 167.332 kg (368 lb 14.4 oz)   SpO2:   95% 96%   Patient Vitals for the past 24 hrs:  BP Temp Temp src Pulse Resp SpO2 Weight  03/05/13 1335 130/44 mmHg 97.7 F (36.5 C) Oral 57 20 96 % -  03/05/13 0538 137/60 mmHg 97.6 F (36.4 C) Oral 61 20 95 % 167.332 kg (368 lb 14.4 oz)  03/05/13 0500 - - - - - - 167.332 kg (368 lb 14.4 oz)  03/04/13 2015 148/62 mmHg 98.6 F (37 C) Oral 64 20 - -  03/04/13 1917 152/57 mmHg 98.6 F (37 C) - 65 16 - -  03/04/13 1815 143/53  mmHg 97.6 F (36.4 C) Oral 59 16 - -  03/04/13 1745 140/60 mmHg 97.4 F (36.3 C) Oral 57 16 - -  03/04/13 1556 145/66 mmHg 98.4 F (36.9 C) Oral 60 18 97 % -     Intake/Output Summary (Last 24 hours) at 03/05/13 1445 Last data filed at 03/05/13 1306  Gross per 24 hour  Intake   1797 ml  Output   2975 ml  Net  -1178 ml   Filed Weights   03/04/13 0602 03/05/13 0500 03/05/13 0538  Weight: 167.604 kg (369 lb 8 oz) 167.332 kg (368 lb 14.4 oz) 167.332 kg (368 lb 14.4 oz)    Exam:   General:  axox3  Cardiovascular: rrr, no mrg  Respiratory: bilat crackles , no wheezes  Abdomen: soft, nt   Musculoskeletal: intact    Data Reviewed: Basic Metabolic Panel:  Recent Labs Lab 03/02/13 2029 03/02/13 2106 03/04/13 0440 03/05/13 0440  NA 133* 134* 135 135  K 4.8 4.7 4.2 4.0  CL 88* 86* 91* 90*  CO2 36*  --  33* 35*  GLUCOSE 199* 201* 138* 175*  BUN 96* 116* 96* 87*  CREATININE 3.49* 3.20* 3.26* 2.99*  CALCIUM 8.3*  --  8.4 8.5  PHOS  --   --  3.9 4.4   Liver Function Tests:  Recent Labs Lab 03/04/13 0440 03/05/13 0440  ALBUMIN 2.9* 2.9*   No results found for this basename: LIPASE, AMYLASE,  in the last 168 hours No results found for this basename: AMMONIA,  in the last 168 hours CBC:  Recent Labs Lab 03/02/13 2029 03/02/13 2106 03/04/13 0440 03/04/13 2319  WBC 13.2*  --  12.0* 11.5*  HGB 8.0* 9.5* 7.8* 8.6*  HCT 25.7* 28.0* 26.3* 27.8*  MCV 80.6  --  83.8 83.2  PLT 294  --  284 268   Cardiac Enzymes:  Recent Labs Lab 03/03/13 0013 03/03/13 0633 03/03/13 1144  TROPONINI <0.30 <0.30 <0.30   BNP (last 3 results)  Recent Labs  03/02/13 2031  PROBNP 12262.0*   CBG:  Recent Labs Lab 03/04/13 1128 03/04/13 1615 03/04/13 2136 03/05/13 0600 03/05/13 1129  GLUCAP 152* 210* 272* 150* 192*    No results found for this or any previous visit (from the past 240 hour(s)).   Studies: No results found.  Scheduled Meds: . amLODipine  5 mg  Oral QHS  . darbepoetin (ARANESP) injection - NON-DIALYSIS  150 mcg Subcutaneous Q Thu-1800  . furosemide  160 mg Oral BID  . heparin  5,000 Units Subcutaneous Q8H  . insulin aspart  0-15 Units Subcutaneous TID WC  . insulin detemir  5 Units Subcutaneous QHS  . isosorbide mononitrate  30 mg Oral Daily  . metoprolol  50 mg Oral BID  . simvastatin  20 mg Oral QPM  . sodium chloride  3 mL Intravenous Q12H   Continuous Infusions:   Principal Problem:   Pulmonary edema Active Problems:   Hypervolemia   Type II or unspecified type diabetes mellitus with unspecified complication, uncontrolled   CKD (chronic kidney disease) stage 4, GFR 15-29 ml/min        Timothy Banks  [facility] Pager [phone]. If 7PM-7AM, please contact night-coverage at [email or web address], password [facility] 03/05/2013, 2:45 PM  LOS: 3 days

## 2013-03-05 NOTE — Progress Notes (Signed)
Inpatient Diabetes Program Recommendations  AACE/ADA: New Consensus Statement on Inpatient Glycemic Control (2013)  Target Ranges:  Prepandial:   less than 140 mg/dL      Peak postprandial:   less than 180 mg/dL (1-2 hours)      Critically ill patients:  140 - 180 mg/dL  Hyperglycemia following lunch and supper  Results for BREYER, TEJERA (MRN [id number]) as of 03/05/2013 17:22  Ref. Range 03/04/2013 06:11 03/04/2013 11:28 03/04/2013 14:44 03/04/2013 15:25 03/04/2013 16:15 03/04/2013 21:36 03/04/2013 23:19 03/05/2013 04:04 03/05/2013 04:40 03/05/2013 06:00 03/05/2013 11:29  Glucose-Capillary Latest Range: 70-99 mg/dL 413 (H) 244 (H)   010 (H) 272 (H)    150 (H) 192 (H)   Agree with not using Glipizide while in the hospital.  Pt documented as eating 100%  Inpatient Diabetes Program Recommendations Insulin - Meal Coverage: add 3 units novolog meal coverage. (Pt gets only if eats greater than or equal to 50%) Add meal coverage and decrease correction to sensitive tidwc.  Thank you, Lenor Coffin, RN, CNS, Diabetes Coordinator [phone])

## 2013-03-06 LAB — RENAL FUNCTION PANEL
Albumin: 2.9 g/dL — ABNORMAL LOW (ref 3.5–5.2)
CO2: 37 mEq/L — ABNORMAL HIGH (ref 19–32)
Calcium: 8.2 mg/dL — ABNORMAL LOW (ref 8.4–10.5)
GFR calc Af Amer: 28 mL/min — ABNORMAL LOW (ref >90)
GFR calc non Af Amer: 24 mL/min — ABNORMAL LOW (ref >90)
Phosphorus: 4.1 mg/dL (ref 2.3–4.6)
Sodium: 135 mEq/L (ref 135–145)

## 2013-03-06 MED ORDER — FUROSEMIDE 80 MG PO TABS: 160.0000 mg | ORAL_TABLET | Freq: Two times a day (BID) | ORAL | Status: DC

## 2013-03-06 NOTE — Discharge Summary (Signed)
Physician Discharge Summary  Timothy Banks RUE:[id number] DOB: 09-Nov-1955 DOA: 03/02/2013  PCP: Luanna Salk, MD  Admit date: 03/02/2013 Discharge date: 03/06/2013  Time spent: > 35 minutes  Recommendations for Outpatient Follow-up:  Please be sure to follow up with your primary care physician and nephrologist in 1-2 weeks or sooner.  Discharge Diagnoses:  Principal Problem:   Pulmonary edema Active Problems:   Hypervolemia   Type II or unspecified type diabetes mellitus with unspecified complication, uncontrolled   CKD (chronic kidney disease) stage 4, GFR 15-29 ml/min   Discharge Condition: stable  Diet recommendation: heart healthy low sodium diet  Filed Weights   03/05/13 0500 03/05/13 0538 03/06/13 0623  Weight: 167.332 kg (368 lb 14.4 oz) 167.332 kg (368 lb 14.4 oz) 167.105 kg (368 lb 6.4 oz)    History of present illness:  From original HPI: Timothy Banks is a 57 y.o. male who presents with acute on chronic SOB. He has recently been admitted to the hospital on 4/11 and again on 4/15 for the same symptoms determined to be due to fluid retention secondary to CKD stage 4-5. He had a fistula placed in his RUE on 4/23 which is in the process of maturing so the patient can begin dialysis to treat his renal failure. He DOES have a history of heart disease including CHF, CAD with prior MI. He also has a history of DM2.  In the ED today patient was again found to be fluid overloaded with frank pulmonary edema on CXR. Hospitalist has been asked to admit.  Hospital Course:  1. Acute on chronic CHF Most likely diastolic based on echocardiogram. Echocardiogram pending. Pt is responding to diuretics. Nephrology is on board. Patient tolerated oral lasix regimen.  Will discharge on lasix 160 mg po bid. Pt to eat a low salt diet.  If he gains 3 lbs in one day to the next he is to call his PCP for further recommendations.  Echocardiogram shows EF of 50-55 percent with no regional wall  motion abnormality. Grade 2 diastolic dysfunction.   CKD stage 4-  - Patient to follow up with Nephrologist.  Appointment has already been made. Patient aware and to call Dr. Debbora Lacrosse office to confirm appointment date. Currently no plans for HD   CAD s/p remote CABG  - continue with BB, Imdur and statin  - Stable   Anemia of CKD - stable Hb without bleed. Nephrology recommended 1 unit of PRBC and patient tolerated transfusion well. Will discharge on oral lasix regimen as recommended by nephrology - Hemoglobin stable with no active bleeding.  DM type 2 - SSI.  - continue glipizide and patient to continue follow up with primary care physician.   Procedures:  2 D echocardiogram  Consultations:  Nephrology: Dr. Dolores Patty  Discharge Exam: Filed Vitals:   03/05/13 1335 03/05/13 2100 03/06/13 0623 03/06/13 1456  BP: 130/44 159/61 152/59 146/60  Pulse: 57 67 66 70  Temp: 97.7 F (36.5 C) 97.3 F (36.3 C) 97.7 F (36.5 C) 98.4 F (36.9 C)  TempSrc: Oral Oral Oral Oral  Resp: 20 20 20 19   Height:      Weight:   167.105 kg (368 lb 6.4 oz)   SpO2: 96% 96% 98% 98%    General: Pt in NAD, Alert and Awake Cardiovascular: RRR, NO MRG Respiratory: CTA Bl, no wheezes  Discharge Instructions  Discharge Orders   Future Appointments Provider Department Dept Phone   03/26/2013 1:15 PM Sherren Kerns, MD [facility] [phone]   Future Orders Complete By Expires     (HEART FAILURE PATIENTS) Call MD:  Anytime you have any of the following symptoms: 1) 3 pound weight gain in 24 hours or 5 pounds in 1 week 2) shortness of breath, with or without a dry hacking cough 3) swelling in the hands, feet or stomach 4) if you have to sleep on extra pillows at night in order to breathe.  As directed     Call MD for:  difficulty breathing, headache or visual disturbances  As directed     Call MD for:  temperature >100.4  As directed     Diet - low sodium heart  healthy  As directed     Discharge instructions  As directed     Comments:      Home with home health.  Please follow up with primary care physician in 1-2 weeks or sooner.    Increase activity slowly  As directed         Medication List    STOP taking these medications       PRESCRIPTION MEDICATION      TAKE these medications       albuterol 108 (90 BASE) MCG/ACT inhaler  Commonly known as:  PROVENTIL HFA;VENTOLIN HFA  Inhale 2 puffs into the lungs every 6 (six) hours as needed for wheezing or shortness of breath.     amLODipine 5 MG tablet  Commonly known as:  NORVASC  Take 1 tablet (5 mg total) by mouth at bedtime.     furosemide 80 MG tablet  Commonly known as:  LASIX  Take 2 tablets (160 mg total) by mouth 2 (two) times daily.     glipiZIDE 10 MG tablet  Commonly known as:  GLUCOTROL  Take 10 mg by mouth 2 (two) times daily before a meal.     isosorbide mononitrate 30 MG 24 hr tablet  Commonly known as:  IMDUR  Take 30 mg by mouth daily.     metoprolol 50 MG tablet  Commonly known as:  LOPRESSOR  Take 1 tablet (50 mg total) by mouth 2 (two) times daily.     nitroGLYCERIN 0.4 MG SL tablet  Commonly known as:  NITROSTAT  Place 0.4 mg under the tongue every 5 (five) minutes as needed for chest pain.     oxyCODONE-acetaminophen 10-325 MG per tablet  Commonly known as:  PERCOCET  Take 1 tablet by mouth every 8 (eight) hours as needed for pain.     potassium chloride SA 20 MEQ tablet  Commonly known as:  K-DUR,KLOR-CON  Take 2 tablets (40 mEq total) by mouth daily.     simvastatin 20 MG tablet  Commonly known as:  ZOCOR  Take 20 mg by mouth every evening.      ASK your doctor about these medications       furosemide 80 MG tablet  Commonly known as:  LASIX  Take 2 tablets (160 mg total) by mouth 2 (two) times daily.       No Known Allergies     Follow-up Information   Follow up with [facility] . (Home Health Nurse)    Contact  information:   [phone]       The results of significant diagnostics from this hospitalization (including imaging, microbiology, ancillary and laboratory) are listed below for reference.    Significant Diagnostic Studies: Dg Chest 2 View  03/02/2013  *RADIOLOGY REPORT*  Clinical Data: Shortness of  breath.  Fluid on lungs.  CHEST - 2 VIEW  Comparison: 02/19/2013.  Findings: The right IJ central venous catheter has been removed. There is stable cardiomegaly status post median sternotomy and CABG.  Interstitial edema has improved, although there is evidence of small pleural effusions bilaterally.  There is no confluent airspace opacity or pneumothorax.  IMPRESSION: Cardiomegaly with interstitial pulmonary edema and small bilateral pleural effusions consistent with mild congestive heart failure.   Original Report Authenticated By: Carey Bullocks, M.D.    US Renal Port  02/17/2013  *RADIOLOGY REPORT*  Clinical Data: 57 year old male with acute renal failure.  RENAL/URINARY TRACT ULTRASOUND COMPLETE  Comparison:  12/08/2012. CT abdomen and pelvis 07/18/2011.  Findings:  Right Kidney:  No hydronephrosis.  Renal length approximately 10.0 cm.  Cortical echotexture at the upper limits of normal.  Left Kidney:  Difficult to visualize.  No hydronephrosis.  Stable renal cortical thickness.  Echotexture appears mildly increased.  Bladder:  Not visible.  IMPRESSION: No hydronephrosis.  Ventilation in the left kidney limited by large body habitus. Consider chronic medical renal disease.   Original Report Authenticated By: Erskine Speed, M.D.    Dg Chest Port 1 View  02/19/2013  *RADIOLOGY REPORT*  Clinical Data:  Pulmonary edema  PORTABLE CHEST - 1 VIEW  Comparison: Portable exam 0608 hours compared to 02/17/2013  Findings: Right jugular central venous catheter with tip projecting over SVC. Enlargement of cardiac silhouette post CABG. Pulmonary vascular congestion. Decreased lung volumes. Accentuation of perihilar  markings likely represents mild pulmonary edema. No segmental consolidation, pleural effusion, or pneumothorax.  IMPRESSION: Probable mild CHF.   Original Report Authenticated By: Ulyses Southward, M.D.    Dg Chest Port 1 View  02/17/2013  *RADIOLOGY REPORT*  Clinical Data: New right internal jugular hemodialysis catheter  PORTABLE CHEST - 1 VIEW  Comparison: Portable exam 1259 hours compared to 02/17/2013  Findings: New right IJ catheter tip projecting over SVC at the level of the aortic arch. Enlargement of cardiac silhouette post CABG. Pulmonary vascular congestion. Perihilar infiltrate likely mild edema. No gross pleural effusion or pneumothorax.  IMPRESSION: No pneumothorax following right jugular line placement. Probable mild pulmonary edema.   Original Report Authenticated By: Ulyses Southward, M.D.     Microbiology: No results found for this or any previous visit (from the past 240 hour(s)).   Labs: Basic Metabolic Panel:  Recent Labs Lab 03/02/13 2029 03/02/13 2106 03/04/13 0440 03/05/13 0440 03/06/13 0650  NA 133* 134* 135 135 135  K 4.8 4.7 4.2 4.0 3.9  CL 88* 86* 91* 90* 90*  CO2 36*  --  33* 35* 37*  GLUCOSE 199* 201* 138* 175* 219*  BUN 96* 116* 96* 87* 76*  CREATININE 3.49* 3.20* 3.26* 2.99* 2.75*  CALCIUM 8.3*  --  8.4 8.5 8.2*  PHOS  --   --  3.9 4.4 4.1   Liver Function Tests:  Recent Labs Lab 03/04/13 0440 03/05/13 0440 03/06/13 0650  ALBUMIN 2.9* 2.9* 2.9*   No results found for this basename: LIPASE, AMYLASE,  in the last 168 hours No results found for this basename: AMMONIA,  in the last 168 hours CBC:  Recent Labs Lab 03/02/13 2029 03/02/13 2106 03/04/13 0440 03/04/13 2319  WBC 13.2*  --  12.0* 11.5*  HGB 8.0* 9.5* 7.8* 8.6*  HCT 25.7* 28.0* 26.3* 27.8*  MCV 80.6  --  83.8 83.2  PLT 294  --  284 268   Cardiac Enzymes:  Recent Labs Lab 03/03/13 0013 03/03/13  1914 03/03/13 1144  TROPONINI <0.30 <0.30 <0.30   BNP: BNP (last 3 results)  Recent  Labs  03/02/13 2031  PROBNP 12262.0*   CBG:  Recent Labs Lab 03/05/13 1129 03/05/13 1623 03/05/13 2143 03/06/13 0620 03/06/13 1100  GLUCAP 192* 249* 335* 199* 192*       Signed:  [name]  [facility] 03/06/2013, 3:08 PM

## 2013-03-06 NOTE — Progress Notes (Signed)
Patient ID: Timothy Banks, male   DOB: 1956/08/04, 57 y.o.   MRN: [id number] S:feels much better but would like a rx for advair O:BP 152/59  Pulse 66  Temp(Src) 97.7 F (36.5 C) (Oral)  Resp 20  Ht 5\' 7"  (1.702 m)  Wt 167.105 kg (368 lb 6.4 oz)  BMI 57.69 kg/m2  SpO2 98%  Intake/Output Summary (Last 24 hours) at 03/06/13 1108 Last data filed at 03/06/13 6962  Gross per 24 hour  Intake   1320 ml  Output   2295 ml  Net   -975 ml   Intake/Output: I/O last 3 completed shifts: In: 2147 [P.O.:1920; I.V.:103; IV Piggyback:124] Out: 3995 [Urine:3995]  Intake/Output this shift:  Total I/O In: 240 [P.O.:240] Out: 225 [Urine:225] Weight change: -0.227 kg (-8 oz) Gen:WD WN obese WM in NAD CVS:no rub Resp:decreased BS at bases XBM:WUXLK distended Ext:1+edema   Recent Labs Lab 03/02/13 2029 03/02/13 2106 03/04/13 0440 03/05/13 0440 03/06/13 0650  NA 133* 134* 135 135 135  K 4.8 4.7 4.2 4.0 3.9  CL 88* 86* 91* 90* 90*  CO2 36*  --  33* 35* 37*  GLUCOSE 199* 201* 138* 175* 219*  BUN 96* 116* 96* 87* 76*  CREATININE 3.49* 3.20* 3.26* 2.99* 2.75*  ALBUMIN  --   --  2.9* 2.9* 2.9*  CALCIUM 8.3*  --  8.4 8.5 8.2*  PHOS  --   --  3.9 4.4 4.1   Liver Function Tests:  Recent Labs Lab 03/04/13 0440 03/05/13 0440 03/06/13 0650  ALBUMIN 2.9* 2.9* 2.9*   No results found for this basename: LIPASE, AMYLASE,  in the last 168 hours No results found for this basename: AMMONIA,  in the last 168 hours CBC:  Recent Labs Lab 03/02/13 2029 03/02/13 2106 03/04/13 0440 03/04/13 2319  WBC 13.2*  --  12.0* 11.5*  HGB 8.0* 9.5* 7.8* 8.6*  HCT 25.7* 28.0* 26.3* 27.8*  MCV 80.6  --  83.8 83.2  PLT 294  --  284 268   Cardiac Enzymes:  Recent Labs Lab 03/03/13 0013 03/03/13 0633 03/03/13 1144  TROPONINI <0.30 <0.30 <0.30   CBG:  Recent Labs Lab 03/05/13 1129 03/05/13 1623 03/05/13 2143 03/06/13 0620 03/06/13 1100  GLUCAP 192* 249* 335* 199* 192*    Iron Studies: No  results found for this basename: IRON, TIBC, TRANSFERRIN, FERRITIN,  in the last 72 hours Studies/Results: No results found. Marland Kitchen amLODipine  5 mg Oral QHS  . darbepoetin (ARANESP) injection - NON-DIALYSIS  150 mcg Subcutaneous Q Thu-1800  . furosemide  160 mg Oral BID  . heparin  5,000 Units Subcutaneous Q8H  . insulin aspart  0-15 Units Subcutaneous TID WC  . insulin detemir  5 Units Subcutaneous QHS  . isosorbide mononitrate  30 mg Oral Daily  . metoprolol  50 mg Oral BID  . simvastatin  20 mg Oral QPM  . sodium chloride  3 mL Intravenous Q12H    BMET    Component Value Date/Time   NA 135 03/06/2013 0650   K 3.9 03/06/2013 0650   CL 90* 03/06/2013 0650   CO2 37* 03/06/2013 0650   GLUCOSE 219* 03/06/2013 0650   BUN 76* 03/06/2013 0650   CREATININE 2.75* 03/06/2013 0650   CALCIUM 8.2* 03/06/2013 0650   GFRNONAA 24* 03/06/2013 0650   GFRAA 28* 03/06/2013 0650   CBC    Component Value Date/Time   WBC 11.5* 03/04/2013 2319   RBC 3.34* 03/04/2013 2319   HGB 8.6* 03/04/2013  2319   HCT 27.8* 03/04/2013 2319   PLT 268 03/04/2013 2319   MCV 83.2 03/04/2013 2319   MCH 25.7* 03/04/2013 2319   MCHC 30.9 03/04/2013 2319   RDW 17.6* 03/04/2013 2319   LYMPHSABS 0.7 02/17/2013 1130   MONOABS 0.9 02/17/2013 1130   EOSABS 0.4 02/17/2013 1130   BASOSABS 0.0 02/17/2013 1130     Assessment/Plan:  1. Acute on chronic CHF. Pt is responding to diuretics. Will switch to po Lasix and see how he does. If he responds, may be stable for d/c. Await 2D ECHO report. May be other source of his symptoms such as obesity hypoventillation syndrome. He might benefit from sleep study as an outpt given his high pre-test probability.  2. SOB improved.  Wants rx for advair MDI- directed toward hospitalist prior to discharge. 3. AKI/CKD- Scr continues to improve with diuresis and is actually better than when he left a week ago. No uremic symptoms, however pt is willing to go on HD to help with his volume and SOB if needed. Will cont to  follow but currently no indication for HD 4. ACDz- may be part of explanation for his worsening DOE/SOB. Improved symptoms after blood transfusion. Cont with outpt EPO 5. DM- per primary service 6. Vascular access- +T/B of AVF placed 02/23/13. F/u with Dr. Darrick Penna 7. Obesity- ?Obesity hypoventillation syndrome. May need sleep study 8. HTN- stable 9. Dispo- d/c to home hopefully today.  F/u with Dr. Lowell Guitar Mar 27, 2013 at 8:30am. Card given to pt.  Will sign off. 10.   Janvi Ammar A

## 2013-03-09 ENCOUNTER — Encounter (HOSPITAL_COMMUNITY): Payer: Self-pay | Admitting: Neurology

## 2013-03-09 ENCOUNTER — Inpatient Hospital Stay (HOSPITAL_COMMUNITY)
Admission: AD | Admit: 2013-03-09 | Discharge: 2013-03-14 | DRG: 189 | Disposition: A | Discharge status: Home / Self Care | Payer: Medicare Other | Source: Other Acute Inpatient Hospital | Attending: Internal Medicine | Admitting: Internal Medicine

## 2013-03-09 DIAGNOSIS — J45909 Unspecified asthma, uncomplicated: Secondary | ICD-10-CM | POA: Diagnosis present

## 2013-03-09 DIAGNOSIS — N2581 Secondary hyperparathyroidism of renal origin: Secondary | ICD-10-CM | POA: Diagnosis present

## 2013-03-09 DIAGNOSIS — E1165 Type 2 diabetes mellitus with hyperglycemia: Secondary | ICD-10-CM | POA: Diagnosis present

## 2013-03-09 DIAGNOSIS — E8770 Fluid overload, unspecified: Secondary | ICD-10-CM | POA: Diagnosis present

## 2013-03-09 DIAGNOSIS — Z2989 Encounter for other specified prophylactic measures: Secondary | ICD-10-CM

## 2013-03-09 DIAGNOSIS — Z951 Presence of aortocoronary bypass graft: Secondary | ICD-10-CM

## 2013-03-09 DIAGNOSIS — Z87891 Personal history of nicotine dependence: Secondary | ICD-10-CM

## 2013-03-09 DIAGNOSIS — E785 Hyperlipidemia, unspecified: Secondary | ICD-10-CM | POA: Diagnosis present

## 2013-03-09 DIAGNOSIS — Z992 Dependence on renal dialysis: Secondary | ICD-10-CM | POA: Diagnosis not present

## 2013-03-09 DIAGNOSIS — D72829 Elevated white blood cell count, unspecified: Secondary | ICD-10-CM | POA: Diagnosis present

## 2013-03-09 DIAGNOSIS — Z22322 Carrier or suspected carrier of Methicillin resistant Staphylococcus aureus: Secondary | ICD-10-CM

## 2013-03-09 DIAGNOSIS — G4733 Obstructive sleep apnea (adult) (pediatric): Secondary | ICD-10-CM | POA: Diagnosis present

## 2013-03-09 DIAGNOSIS — J9600 Acute respiratory failure, unspecified whether with hypoxia or hypercapnia: Principal | ICD-10-CM | POA: Diagnosis present

## 2013-03-09 DIAGNOSIS — I2510 Atherosclerotic heart disease of native coronary artery without angina pectoris: Secondary | ICD-10-CM | POA: Diagnosis present

## 2013-03-09 DIAGNOSIS — I252 Old myocardial infarction: Secondary | ICD-10-CM

## 2013-03-09 DIAGNOSIS — E8779 Other fluid overload: Secondary | ICD-10-CM | POA: Diagnosis present

## 2013-03-09 DIAGNOSIS — Z79899 Other long term (current) drug therapy: Secondary | ICD-10-CM

## 2013-03-09 DIAGNOSIS — I509 Heart failure, unspecified: Secondary | ICD-10-CM | POA: Diagnosis present

## 2013-03-09 DIAGNOSIS — N184 Chronic kidney disease, stage 4 (severe): Secondary | ICD-10-CM | POA: Diagnosis present

## 2013-03-09 DIAGNOSIS — N186 End stage renal disease: Secondary | ICD-10-CM | POA: Diagnosis present

## 2013-03-09 DIAGNOSIS — J9601 Acute respiratory failure with hypoxia: Secondary | ICD-10-CM

## 2013-03-09 DIAGNOSIS — D649 Anemia, unspecified: Secondary | ICD-10-CM | POA: Diagnosis present

## 2013-03-09 DIAGNOSIS — Z6841 Body Mass Index (BMI) 40.0 and over, adult: Secondary | ICD-10-CM

## 2013-03-09 DIAGNOSIS — N179 Acute kidney failure, unspecified: Secondary | ICD-10-CM

## 2013-03-09 DIAGNOSIS — I120 Hypertensive chronic kidney disease with stage 5 chronic kidney disease or end stage renal disease: Secondary | ICD-10-CM | POA: Diagnosis present

## 2013-03-09 DIAGNOSIS — J811 Chronic pulmonary edema: Secondary | ICD-10-CM | POA: Diagnosis present

## 2013-03-09 DIAGNOSIS — Z418 Encounter for other procedures for purposes other than remedying health state: Secondary | ICD-10-CM

## 2013-03-09 DIAGNOSIS — Z9111 Patient's noncompliance with dietary regimen: Secondary | ICD-10-CM

## 2013-03-09 LAB — CBC
HCT: 32 % — ABNORMAL LOW (ref 39.0–52.0)
Hemoglobin: 10 g/dL — ABNORMAL LOW (ref 13.0–17.0)
WBC: 13 10*3/uL — ABNORMAL HIGH (ref 4.0–10.5)

## 2013-03-09 LAB — BASIC METABOLIC PANEL
BUN: 71 mg/dL — ABNORMAL HIGH (ref 6–23)
CO2: 33 mEq/L — ABNORMAL HIGH (ref 19–32)
Chloride: 91 mEq/L — ABNORMAL LOW (ref 96–112)
Glucose, Bld: 124 mg/dL — ABNORMAL HIGH (ref 70–99)
Potassium: 4.9 mEq/L (ref 3.5–5.1)

## 2013-03-09 MED ORDER — SODIUM CHLORIDE 0.9 % IV SOLN: 250.0000 mL | INTRAVENOUS | Status: DC | PRN

## 2013-03-09 MED ORDER — OXYCODONE-ACETAMINOPHEN 5-325 MG PO TABS
1.0000 | ORAL_TABLET | Freq: Three times a day (TID) | ORAL | Status: DC | PRN
  Administered 2013-03-11 – 2013-03-14 (×4): 1 via ORAL
  Filled 2013-03-09 (×6): qty 1

## 2013-03-09 MED ORDER — INSULIN ASPART 100 UNIT/ML ~~LOC~~ SOLN
0.0000 [IU] | Freq: Three times a day (TID) | SUBCUTANEOUS | Status: DC
  Administered 2013-03-10: 3 [IU] via SUBCUTANEOUS
  Administered 2013-03-10: 2 [IU] via SUBCUTANEOUS
  Administered 2013-03-10: 1 [IU] via SUBCUTANEOUS
  Administered 2013-03-11 (×2): 2 [IU] via SUBCUTANEOUS
  Administered 2013-03-11 – 2013-03-13 (×4): 1 [IU] via SUBCUTANEOUS
  Administered 2013-03-13: 3 [IU] via SUBCUTANEOUS
  Administered 2013-03-13 – 2013-03-14 (×2): 2 [IU] via SUBCUTANEOUS

## 2013-03-09 MED ORDER — SIMVASTATIN 20 MG PO TABS
20.0000 mg | ORAL_TABLET | Freq: Every evening | ORAL | Status: DC
  Administered 2013-03-09 – 2013-03-14 (×5): 20 mg via ORAL
  Filled 2013-03-09 (×6): qty 1

## 2013-03-09 MED ORDER — OXYCODONE HCL 5 MG PO TABS
5.0000 mg | ORAL_TABLET | Freq: Three times a day (TID) | ORAL | Status: DC | PRN
  Administered 2013-03-09 – 2013-03-14 (×5): 5 mg via ORAL
  Filled 2013-03-09 (×5): qty 1

## 2013-03-09 MED ORDER — SODIUM CHLORIDE 0.9 % IJ SOLN
3.0000 mL | Freq: Two times a day (BID) | INTRAMUSCULAR | Status: DC
  Administered 2013-03-09 – 2013-03-14 (×7): 3 mL via INTRAVENOUS

## 2013-03-09 MED ORDER — NITROGLYCERIN 0.4 MG SL SUBL: 0.4000 mg | SUBLINGUAL_TABLET | SUBLINGUAL | Status: DC | PRN

## 2013-03-09 MED ORDER — SODIUM CHLORIDE 0.9 % IJ SOLN: 3.0000 mL | INTRAMUSCULAR | Status: DC | PRN

## 2013-03-09 MED ORDER — METOPROLOL TARTRATE 50 MG PO TABS
50.0000 mg | ORAL_TABLET | Freq: Two times a day (BID) | ORAL | Status: DC
  Administered 2013-03-09 – 2013-03-14 (×8): 50 mg via ORAL
  Filled 2013-03-09 (×11): qty 1

## 2013-03-09 MED ORDER — ONDANSETRON HCL 4 MG/2ML IJ SOLN: 4.0000 mg | Freq: Four times a day (QID) | INTRAMUSCULAR | Status: DC | PRN

## 2013-03-09 MED ORDER — INSULIN ASPART 100 UNIT/ML ~~LOC~~ SOLN
0.0000 [IU] | Freq: Every day | SUBCUTANEOUS | Status: DC
  Administered 2013-03-12: 2 [IU] via SUBCUTANEOUS

## 2013-03-09 MED ORDER — HEPARIN SODIUM (PORCINE) 5000 UNIT/ML IJ SOLN
5000.0000 [IU] | Freq: Three times a day (TID) | INTRAMUSCULAR | Status: DC
  Administered 2013-03-09 – 2013-03-14 (×10): 5000 [IU] via SUBCUTANEOUS
  Filled 2013-03-09 (×17): qty 1

## 2013-03-09 MED ORDER — OXYCODONE-ACETAMINOPHEN 10-325 MG PO TABS: 1.0000 | ORAL_TABLET | Freq: Three times a day (TID) | ORAL | Status: DC | PRN

## 2013-03-09 MED ORDER — ISOSORBIDE MONONITRATE ER 30 MG PO TB24
30.0000 mg | ORAL_TABLET | Freq: Every day | ORAL | Status: DC
  Administered 2013-03-09 – 2013-03-14 (×6): 30 mg via ORAL
  Filled 2013-03-09 (×6): qty 1

## 2013-03-09 MED ORDER — METOLAZONE 10 MG PO TABS
10.0000 mg | ORAL_TABLET | Freq: Every day | ORAL | Status: DC
  Administered 2013-03-09 – 2013-03-14 (×6): 10 mg via ORAL
  Filled 2013-03-09 (×7): qty 1

## 2013-03-09 MED ORDER — POTASSIUM CHLORIDE CRYS ER 20 MEQ PO TBCR
40.0000 meq | EXTENDED_RELEASE_TABLET | Freq: Every day | ORAL | Status: DC
  Administered 2013-03-09 – 2013-03-11 (×3): 40 meq via ORAL
  Filled 2013-03-09 (×3): qty 2

## 2013-03-09 MED ORDER — ALBUTEROL SULFATE HFA 108 (90 BASE) MCG/ACT IN AERS
2.0000 | INHALATION_SPRAY | Freq: Four times a day (QID) | RESPIRATORY_TRACT | Status: DC | PRN
  Filled 2013-03-09: qty 6.7

## 2013-03-09 MED ORDER — FUROSEMIDE 80 MG PO TABS
160.0000 mg | ORAL_TABLET | Freq: Four times a day (QID) | ORAL | Status: DC
  Administered 2013-03-09 – 2013-03-14 (×17): 160 mg via ORAL
  Filled 2013-03-09 (×23): qty 2

## 2013-03-09 MED ORDER — AMLODIPINE BESYLATE 5 MG PO TABS
5.0000 mg | ORAL_TABLET | Freq: Every day | ORAL | Status: DC
  Administered 2013-03-09 – 2013-03-14 (×5): 5 mg via ORAL
  Filled 2013-03-09 (×6): qty 1

## 2013-03-09 MED ORDER — ACETAMINOPHEN 325 MG PO TABS
650.0000 mg | ORAL_TABLET | ORAL | Status: DC | PRN
  Administered 2013-03-13: 650 mg via ORAL
  Filled 2013-03-09: qty 2

## 2013-03-09 NOTE — H&P (Signed)
[facility] History and Physical  Timothy Banks NFA:[id number] DOB: 11/20/1955 DOA: 03/09/2013  Referring physician: Duke Salvia PCP: SUFFRONT,WILFORD, MD  Specialists: None  Chief Complaint: SOB  HPI: Timothy Banks is a 57 y.o. male who presents with acute on chronic SOB.  He has recently been admitted to the hospital on 4/11 and again on 4/15 and again on 4/28- just d/c'd on the 2nd.  After he left here the next day he became SOB and went to [place].  He was sent from [facility] for possible need for HD.  No one spoke with nephrology before transfer.  He is currently on [place] oxygenating fine.  He says he was on bipap at night and then helped  Record from [place] sent and majority of record is copies of our record from previous hospitalizations, no d/c summary included.  Swelling better in legs Chronic SOB No CP No fever, no chills  Review of Systems: all systems reviewed and otherwise negative.  Past Medical History  Diagnosis Date  . Myocardial infarction   . Diabetes mellitus without complication   . CHF (congestive heart failure)   . Asthma   . Hyperlipidemia   . Hypertension   . Chronic kidney disease 02/17/2013    ACUTE RENAL   Past Surgical History  Procedure Laterality Date  . Right carotid endarterectomy  2002    right side  . Coronary artery bypass graft  2002  . Av fistula placement Right 02/25/2013    Procedure: ARTERIOVENOUS (AV) FISTULA CREATION;  Surgeon: Sherren Kerns, MD;  Location: [facility] OR;  Service: Vascular;  Laterality: Right;  Right Radiocephalic AVF   Social History:  reports that he has quit smoking. He started smoking about 39 years ago. He has never used smokeless tobacco. He reports that he does not drink alcohol or use illicit drugs.   No Known Allergies  Family History  Problem Relation Age of Onset  . Diabetes Mother   . Heart disease Mother   . Diabetes Father   . Heart disease Father     Prior to Admission medications   Medication  Sig Start Date End Date Taking? Authorizing Provider  albuterol (PROVENTIL HFA;VENTOLIN HFA) 108 (90 BASE) MCG/ACT inhaler Inhale 2 puffs into the lungs every 6 (six) hours as needed for wheezing or shortness of breath.   Yes Historical Provider, MD  amLODipine (NORVASC) 5 MG tablet Take 1 tablet (5 mg total) by mouth at bedtime. 02/23/13  Yes [name], MD  furosemide (LASIX) 80 MG tablet Take 2 tablets (160 mg total) by mouth 2 (two) times daily. 02/23/13  Yes [name], MD  glipiZIDE (GLUCOTROL) 10 MG tablet Take 10 mg by mouth 2 (two) times daily before a meal.   Yes Eugenia Pancoast  isosorbide mononitrate (IMDUR) 30 MG 24 hr tablet Take 30 mg by mouth daily.   Yes Historical Provider, MD  metoprolol (LOPRESSOR) 50 MG tablet Take 1 tablet (50 mg total) by mouth 2 (two) times daily. 02/23/13  Yes [name], MD  nitroGLYCERIN (NITROSTAT) 0.4 MG SL tablet Place 0.4 mg under the tongue every 5 (five) minutes as needed for chest pain.   Yes Historical Provider, MD  oxyCODONE-acetaminophen (PERCOCET) 10-325 MG per tablet Take 1 tablet by mouth every 8 (eight) hours as needed for pain.    Yes Eugenia Pancoast  potassium chloride SA (K-DUR,KLOR-CON) 20 MEQ tablet Take 2 tablets (40 mEq total) by mouth daily. 02/23/13  Yes [name], MD  PRESCRIPTION  MEDICATION Inject 3 Units into the skin once.   Yes Historical Provider, MD  simvastatin (ZOCOR) 20 MG tablet Take 20 mg by mouth every evening.   Yes Eugenia Pancoast   Physical Exam: Filed Vitals:   03/09/13 1819  BP: 135/57  Pulse: 70  Temp: 97.8 F (36.6 C)  TempSrc: Oral  Resp: 20  Height: 5\' 7"  (1.702 m)  Weight: 167.2 kg (368 lb 9.8 oz)  SpO2: 90%    General:  NAD, resting comfortably in bed Eyes: PEERLA EOMI ENT: mucous membranes moist Neck: supple w/o JVD Cardiovascular: RRR w/o MRG Respiratory: rhonchi in B lung fields. Abdomen: soft, nt, morbidly obese, bs+ Skin: no rash nor lesion Musculoskeletal: MAE, full ROM all 4  extremities, 3+ pitting edema BLE Psychiatric: normal tone and affect Neurologic: AAOx3, grossly non-focal  Labs on Admission:  Basic Metabolic Panel:  Recent Labs Lab 03/02/13 2029 03/02/13 2106 03/04/13 0440 03/05/13 0440 03/06/13 0650  NA 133* 134* 135 135 135  K 4.8 4.7 4.2 4.0 3.9  CL 88* 86* 91* 90* 90*  CO2 36*  --  33* 35* 37*  GLUCOSE 199* 201* 138* 175* 219*  BUN 96* 116* 96* 87* 76*  CREATININE 3.49* 3.20* 3.26* 2.99* 2.75*  CALCIUM 8.3*  --  8.4 8.5 8.2*  PHOS  --   --  3.9 4.4 4.1   Liver Function Tests:  Recent Labs Lab 03/04/13 0440 03/05/13 0440 03/06/13 0650  ALBUMIN 2.9* 2.9* 2.9*   No results found for this basename: LIPASE, AMYLASE,  in the last 168 hours No results found for this basename: AMMONIA,  in the last 168 hours CBC:  Recent Labs Lab 03/02/13 2029 03/02/13 2106 03/04/13 0440 03/04/13 2319  WBC 13.2*  --  12.0* 11.5*  HGB 8.0* 9.5* 7.8* 8.6*  HCT 25.7* 28.0* 26.3* 27.8*  MCV 80.6  --  83.8 83.2  PLT 294  --  284 268   Cardiac Enzymes:  Recent Labs Lab 03/03/13 0013 03/03/13 0633 03/03/13 1144  TROPONINI <0.30 <0.30 <0.30    BNP (last 3 results)  Recent Labs  03/02/13 2031  PROBNP 12262.0*   CBG:  Recent Labs Lab 03/05/13 1129 03/05/13 1623 03/05/13 2143 03/06/13 0620 03/06/13 1100  GLUCAP 192* 249* 335* 199* 192*    Radiological Exams on Admission: No results found.  EKG: Independently reviewed.  Assessment/Plan Active Problems:   Acute respiratory failure   Type II or unspecified type diabetes mellitus with unspecified complication, uncontrolled   CKD (chronic kidney disease) stage 4, GFR 15-29 ml/min   Pulmonary edema   Morbid obesity   1. Pulmonary edema/fluid overload:   May need HD sooner as per Duke Salvia- spoke with Dr. Darrick Penna who says to continue to diurese with PO.  nephrology is going to try and hold off of HD for as long as possible to give his fistula time to mature (was just put in  on the 23rd).  He recommended lasix 160mg  PO QID to see if this helps as well as zaraoxlyn 10 mg daily; echo shows preserved EF- can consult nephro if does not improve, daily weight, strict I/Os 2. DM2 - holding home glipizide and using med dose SSI of insulin for now AC/HS 3. Prob OSA- Cpap at night 4. Morbid obesity    Code Status: Full Code  Family Communication: patient at bedside Disposition Plan: SDU  Time spent: 70 min  Marlin Canary [facility] Pager [phone]  If 7PM-7AM, please contact night-coverage [email or web address] Password [email or web address]  03/09/2013, 7:00 PM

## 2013-03-10 DIAGNOSIS — E8770 Fluid overload, unspecified: Secondary | ICD-10-CM | POA: Diagnosis present

## 2013-03-10 DIAGNOSIS — D72829 Elevated white blood cell count, unspecified: Secondary | ICD-10-CM | POA: Diagnosis present

## 2013-03-10 DIAGNOSIS — J811 Chronic pulmonary edema: Secondary | ICD-10-CM

## 2013-03-10 DIAGNOSIS — N179 Acute kidney failure, unspecified: Secondary | ICD-10-CM

## 2013-03-10 DIAGNOSIS — Z9111 Patient's noncompliance with dietary regimen: Secondary | ICD-10-CM

## 2013-03-10 LAB — BASIC METABOLIC PANEL
BUN: 73 mg/dL — ABNORMAL HIGH (ref 6–23)
CO2: 32 mEq/L (ref 19–32)
Chloride: 89 mEq/L — ABNORMAL LOW (ref 96–112)
GFR calc non Af Amer: 18 mL/min — ABNORMAL LOW (ref >90)
Glucose, Bld: 144 mg/dL — ABNORMAL HIGH (ref 70–99)
Potassium: 4.7 mEq/L (ref 3.5–5.1)
Sodium: 135 mEq/L (ref 135–145)

## 2013-03-10 LAB — GLUCOSE, CAPILLARY
Glucose-Capillary: 198 mg/dL — ABNORMAL HIGH (ref 70–99)
Glucose-Capillary: 216 mg/dL — ABNORMAL HIGH (ref 70–99)
Glucose-Capillary: 150 mg/dL — ABNORMAL HIGH (ref 70–99)
Glucose-Capillary: 131 mg/dL — ABNORMAL HIGH (ref 70–99)

## 2013-03-10 NOTE — Progress Notes (Signed)
Pt has refused CPAP for the night.  RT to monitor and assess as needed.  

## 2013-03-10 NOTE — Progress Notes (Signed)
Patient mouth breathing and unable to maintain SO2. Patient placed on 35% ventimask. RN made aware.

## 2013-03-10 NOTE — Care Management Note (Signed)
   CARE MANAGEMENT NOTE 03/10/2013  Patient:  Timothy Banks, Timothy Banks   Account Number:  [id number]  Date Initiated:  03/10/2013  Documentation initiated by:  MAYO,HENRIETTA  Subjective/Objective Assessment:   57 yr-old male adm with dx of acute resp failure; lives with spouse, has home O2 through [employer], active with [facility]     Action/Plan:   Anticipated DC Date:     Anticipated DC Plan:  HOME W HOME HEALTH SERVICES      DC Planning Services  CM consult      Choice offered to / List presented to:             Status of service:   Medicare Important Message given?   (If response is "NO", the following Medicare IM given date fields will be blank) Date Medicare IM given:   Date Additional Medicare IM given:    Discharge Disposition:    Per UR Regulation:  Reviewed for med. necessity/level of care/duration of stay  If discussed at Long Length of Stay Meetings, dates discussed:    Comments:  03/10/13 1150 Verdis Prime RN MSN BSN CCM Pt was referred to [facility] congestive heart failure program when discharged on 03/06/13.  TC to [facility] to notify re readmission.

## 2013-03-10 NOTE — Progress Notes (Signed)
Patient O2 sat dropping to 87. FIO2 increase to 50% on ventimask. Current O2 sat 94%. RN made aware.

## 2013-03-10 NOTE — Progress Notes (Signed)
Patient removed from CPAP. Patient says he "had too much wind" in his face. Patient showing increased WOB and SO2 of 87%.  CPAP settings are auto titrate 12-20 cmH2O. CPAP was at 12 cmH2O upon arrival to patient's room. Patient placed on 6L nasal cannula. WOB has improved. Patient currently has SO2 o94%. RN aware.

## 2013-03-10 NOTE — Clinical Documentation Improvement (Signed)
CHF DOCUMENTATION CLARIFICATION QUERY  THIS DOCUMENT IS NOT A PERMANENT PART OF THE MEDICAL RECORD  TO RESPOND TO THE THIS QUERY, FOLLOW THE INSTRUCTIONS BELOW:  1. If needed, update documentation for the patient's encounter via the notes activity.  2. Access this query again and click edit on the In [employer].  3. After updating, or not, click F2 to complete all highlighted (required) fields concerning your review. Select "additional documentation in the medical record" OR "no additional documentation provided".  4. Click Sign note button.  5. The deficiency will fall out of your In Basket *Please let us know if you are not able to complete this workflow by phone or e-mail (listed below).  Please update your documentation within the medical record to reflect your response to this query.                                                                                     03/10/13  Dear Dr. [facility],  In a better effort to capture your patient's severity of illness, reflect appropriate length of stay and utilization of resources, a review of the patient medical record has revealed the following indicators the diagnosis of Heart Failure.    Based on your clinical judgment, please clarify and document in a progress note and/or discharge summary the clinical condition associated with the following supporting information:  In responding to this query please exercise your independent judgment.  The fact that a query is asked, does not imply that any particular answer is desired or expected.  Possible Clinical Conditions?  Chronic Systolic Congestive Heart Failure Chronic Diastolic Congestive Heart Failure Chronic Systolic & Diastolic Congestive Heart Failure Acute Systolic Congestive Heart Failure Acute Diastolic Congestive Heart Failure Acute Systolic & Diastolic Congestive Heart Failure Acute on Chronic Systolic Congestive Heart Failure Acute on Chronic Diastolic Congestive  Heart Failure Acute on Chronic Systolic & Diastolic  Congestive Heart Failure Other Condition________________________________________ Cannot Clinically Determine  Supporting Information:  Risk Factors:(As per notes) " Pt has Hx CHF", "CKD stage IV"  Signs & Symptoms: (As per notes) " 3+ pitting edema"  Diagnostics: Labs = 03-02-13 PROBNP- 12252  Echo =03-04-13  Study Conclusions  - Left ventricle: The cavity size was moderately dilated. Wall thickness was increased in a pattern of mild LVH. Systolic function was normal. The estimated ejection fraction was in the range of 50% to 55%. Wall motion was normal; there were no regional wall motion abnormalities. Features are consistent with a pseudonormal left ventricular filling pattern, with concomitant abnormal relaxation and increased filling pressure (grade 2 diastolic dysfunction). - Left atrium: The atrium was moderately dilated. - Right atrium: The atrium was mildly dilated.  Treatment: metolazone (ZAROXOLYN) tablet 10 mg   [[id number]]    Ordered Dose: 10 mg Route: Oral Frequency: Daily     furosemide (LASIX) tablet 160 mg   [[id number]]    Ordered Dose: 160 mg Route: Oral Frequency: Every 6 hours   furosemide (LASIX) 80 MG tablet [[id number]]   Order Details    Dose: 160 mg Route: Oral Frequency: 2 times daily       Reviewed:  no additional documentation provided  Thank  You,  Joanette Gula Delk RN, BSN, CCDS Clinical Documentation Specialist: [phone] Pager Health Information Management [place]

## 2013-03-10 NOTE — Progress Notes (Signed)
[facility] Progress Note [place] TEAM 1 - Stepdown/ICU TEAM   Timothy Banks ZOX:[id number] DOB: 02-04-1956 DOA: 03/09/2013 PCP: Timothy Salk, MD  Brief narrative: 57 year old male patient with known evolving chronic kidney disease. Now stage V. Has AV fistula recently placed for anticipated initiation of hemodialysis likely within the next 12 months. He has been admitted to the hospital multiple times in the month of April: Initially on 4/11 and again on 4/15 and finally on 4/28. He has chronic dyspnea which becomes worse and when he becomes volume overloaded. Patient endorses has used BiPAP in the past at night which has helped with his symptoms. He was transferred from [facility] because of anticipation of potential initiation of dialysis this admission but per the admitting physician's notes no one from the [facility] contacted the nephrologist to determine if they would be initiating dialysis during this admission. His initial chest x-ray was consistent with volume overload and patient was otherwise stable nasal cannula oxygen. After arrival to this facility the admitting physician spoke with Dr. d who recommended continuing to diurese with Lasix but utilizing a higher dose of 160 mg by mouth 4 times a day and adding Zaroxolyn. Patient endorsed was on 60 mg of Lasix twice daily at home.  Assessment/Plan: Active Problems:   Acute respiratory failure with hypoxia due to:   A)  Pulmonary edema   B)  Volume overload -still requiring VM with 50 % FIO2 -diuresing nicely with very high dose Lasix w/ Zaroxolyn (UOP 2100 since admission) -d/w pt and wife need to adhere to prescribed regimens including free water and Na+ restriction    Type II or unspecified type diabetes mellitus with unspecified complication, uncontrolled -CBG still > 150 -cont SSI and follow -check HgA1c    CKD (chronic kidney disease) stage 4, GFR 15-29 ml/min -trying to not initiate HD this admit -  pt has fistula in right forearm    Leukocytosis -no fever or obvious source seen so will follow labs/clinically    Morbid obesity   HTN -cont Norvasc, Imdur and Lopressor   DVT prophylaxis: Subcutaneous heparin Code Status: Full Family Communication: Patient and wife at bedside Disposition Plan: Transfer to 6700 Isolation: Contact isolation for MRSA positive PCR status  Consultants: Nephrology  Procedures: None  Antibiotics: None  HPI/Subjective: Patient alert but still complaining of shortness of breath and is unclear as to why this is occurring. Explanation given regarding poor renal function and volume overload.   Objective: Blood pressure 152/62, pulse 73, temperature 97.5 F (36.4 C), temperature source Oral, resp. rate 19, height 5\' 7"  (1.702 m), weight 165.2 kg (364 lb 3.2 oz), SpO2 90.00%.  Intake/Output Summary (Last 24 hours) at 03/10/13 1116 Last data filed at 03/10/13 0700  Gross per 24 hour  Intake    360 ml  Output   2100 ml  Net  -1740 ml     Exam: General: No acute respiratory distress Lungs: Decreased bilateral bases with expiratory wheeze and rhonchi, any mask 50 % Cardiovascular: Regular rate and rhythm without murmur gallop or rub normal S1 and S2, trace peripheral edema involving the ankles but minimal JVD Abdomen: Nontender, nondistended, soft, bowel sounds positive, no rebound, no ascites, no appreciable mass Musculoskeletal: No significant cyanosis, clubbing of bilateral lower extremities Neurological: Alert and oriented x 3, moves all extremities x 4 without focal neurological deficits, CN 2-12 intact  Data Reviewed: Basic Metabolic Panel:  Recent Labs Lab 03/04/13 0440 03/05/13 0440 03/06/13 0650 03/09/13 1939 03/10/13 0630  NA 135 135 135 136 135  K 4.2 4.0 3.9 4.9 4.7  CL 91* 90* 90* 91* 89*  CO2 33* 35* 37* 33* 32  GLUCOSE 138* 175* 219* 124* 144*  BUN 96* 87* 76* 71* 73*  CREATININE 3.26* 2.99* 2.75* 3.46* 3.54*   CALCIUM 8.4 8.5 8.2* 8.9 8.9  PHOS 3.9 4.4 4.1  --   --    Liver Function Tests:  Recent Labs Lab 03/04/13 0440 03/05/13 0440 03/06/13 0650  ALBUMIN 2.9* 2.9* 2.9*   No results found for this basename: LIPASE, AMYLASE,  in the last 168 hours No results found for this basename: AMMONIA,  in the last 168 hours CBC:  Recent Labs Lab 03/04/13 0440 03/04/13 2319 03/09/13 1939  WBC 12.0* 11.5* 13.0*  HGB 7.8* 8.6* 10.0*  HCT 26.3* 27.8* 32.0*  MCV 83.8 83.2 85.8  PLT 284 268 228   Cardiac Enzymes:  Recent Labs Lab 03/03/13 1144  TROPONINI <0.30   BNP (last 3 results)  Recent Labs  03/02/13 2031  PROBNP 12262.0*   CBG:  Recent Labs Lab 03/06/13 0620 03/06/13 1100 03/09/13 2150 03/10/13 0852 03/10/13 1107  GLUCAP 199* 192* 177* 198* 216*    No results found for this or any previous visit (from the past 240 hour(s)).   Studies:  Recent x-ray studies have been reviewed in detail by the Attending Physician  Scheduled Meds:  Reviewed in detail by the Attending Physician   Junious Silk, ANP [facility] Office  [phone] Pager [phone]  On-Call/Text Page:      [email or web address]      password [email or web address]  If 7PM-7AM, please contact night-coverage [email or web address] Password [facility] 03/10/2013, 11:16 AM   LOS: 1 day   I have examined the patient, reviewed the chart and modified the above note which I agree with.   Timothy Enriquez,MD [phone] 03/10/2013, 4:43 PM

## 2013-03-11 ENCOUNTER — Inpatient Hospital Stay (HOSPITAL_COMMUNITY): Payer: Medicare Other

## 2013-03-11 DIAGNOSIS — E8779 Other fluid overload: Secondary | ICD-10-CM

## 2013-03-11 LAB — IRON AND TIBC
Iron: 42 ug/dL (ref 42–135)
Saturation Ratios: 18 % — ABNORMAL LOW (ref 20–55)
UIBC: 190 ug/dL (ref 125–400)

## 2013-03-11 LAB — BASIC METABOLIC PANEL
CO2: 34 mEq/L — ABNORMAL HIGH (ref 19–32)
Calcium: 8.6 mg/dL (ref 8.4–10.5)
Creatinine, Ser: 3.52 mg/dL — ABNORMAL HIGH (ref 0.50–1.35)
GFR calc non Af Amer: 18 mL/min — ABNORMAL LOW (ref >90)
Sodium: 134 mEq/L — ABNORMAL LOW (ref 135–145)

## 2013-03-11 LAB — GLUCOSE, CAPILLARY: Glucose-Capillary: 183 mg/dL — ABNORMAL HIGH (ref 70–99)

## 2013-03-11 LAB — HEMOGLOBIN A1C: Mean Plasma Glucose: 120 mg/dL — ABNORMAL HIGH (ref <117)

## 2013-03-11 LAB — HEPATITIS B SURFACE ANTIGEN: Hepatitis B Surface Ag: NEGATIVE

## 2013-03-11 MED ORDER — DARBEPOETIN ALFA-POLYSORBATE 60 MCG/0.3ML IJ SOLN
60.0000 ug | INTRAMUSCULAR | Status: DC
  Filled 2013-03-11: qty 0.3

## 2013-03-11 MED ORDER — RENA-VITE PO TABS
1.0000 | ORAL_TABLET | Freq: Every day | ORAL | Status: DC
  Administered 2013-03-11 – 2013-03-14 (×3): 1 via ORAL
  Filled 2013-03-11 (×4): qty 1

## 2013-03-11 NOTE — Progress Notes (Signed)
Patient refused CPAP. RT will continue to monitor.  

## 2013-03-11 NOTE — Progress Notes (Signed)
VVS  We will pre-op [name] for Diatek placement tomorrow.  Accessment: ESRD NPO Past MN  Timothy Gallant PA-C vvs

## 2013-03-11 NOTE — Consult Note (Signed)
Timothy Banks is an 57 y.o. male referred by Dr Timothy Banks   Chief Complaint: CKD 4 and recurrent volume overload HPI: 57 yo WM with CKD 4 most likely due to DM/HTN admitted again for SOB and volume overload.  Pt just DC last Friday and several hrs after being home he returned to hosp for SOB.  Per his Hx he has had 5-6 hospitalizations in last four months for volume overload.  He says he can'Banks live like this and is ready to start HD  Past Medical History  Diagnosis Date  . Myocardial infarction   . Diabetes mellitus without complication   . CHF (congestive heart failure)   . Asthma   . Hyperlipidemia   . Hypertension   . Chronic kidney disease 02/17/2013    ACUTE RENAL    Past Surgical History  Procedure Laterality Date  . Right carotid endarterectomy  2002    right side  . Coronary artery bypass graft  2002  . Av fistula placement Right 02/25/2013    Procedure: ARTERIOVENOUS (AV) FISTULA CREATION;  Surgeon: Timothy Kerns, MD;  Location: [facility] OR;  Service: Vascular;  Laterality: Right;  Right Radiocephalic AVF    Family History  Problem Relation Age of Onset  . Diabetes Mother   . Heart disease Mother   . Diabetes Father   . Heart disease Father   No FH of renal Ds  Social History:  reports that he has quit smoking. He started smoking about 39 years ago. He has never used smokeless tobacco. He reports that he does not drink alcohol or use illicit drugs.  Married and lives with wife in [place]  Allergies: No Known Allergies  Medications Prior to Admission  Medication Sig Dispense Refill  . albuterol (PROVENTIL HFA;VENTOLIN HFA) 108 (90 BASE) MCG/ACT inhaler Inhale 2 puffs into the lungs every 6 (six) hours as needed for wheezing or shortness of breath.      . ALPRAZolam (XANAX) 0.5 MG tablet Take 0.5 mg by mouth at bedtime.      Marland Kitchen amLODipine (NORVASC) 5 MG tablet Take 1 tablet (5 mg total) by mouth at bedtime.  30 tablet  0  . furosemide (LASIX) 80 MG tablet Take 2 tablets (160  mg total) by mouth 2 (two) times daily.  60 tablet  0  . isosorbide mononitrate (IMDUR) 30 MG 24 hr tablet Take 30 mg by mouth daily.      . metoprolol (LOPRESSOR) 50 MG tablet Take 1 tablet (50 mg total) by mouth 2 (two) times daily.  60 tablet  0  . nitroGLYCERIN (NITROSTAT) 0.4 MG SL tablet Place 0.4 mg under the tongue every 5 (five) minutes as needed for chest pain.      Marland Kitchen oxyCODONE-acetaminophen (PERCOCET) 10-325 MG per tablet Take 1 tablet by mouth every 8 (eight) hours as needed for pain.       . potassium chloride SA (K-DUR,KLOR-CON) 20 MEQ tablet Take 2 tablets (40 mEq total) by mouth daily.  15 tablet  0  . simvastatin (ZOCOR) 20 MG tablet Take 20 mg by mouth every evening.         Lab Results: UA: ND   Recent Labs  03/09/13 1939  WBC 13.0*  HGB 10.0*  HCT 32.0*  PLT 228   BMET  Recent Labs  03/09/13 1939 03/10/13 0630 03/11/13 0440  NA 136 135 134*  K 4.9 4.7 3.8  CL 91* 89* 88*  CO2 33* 32 34*  GLUCOSE 124* 144*  115*  BUN 71* 73* 73*  CREATININE 3.46* 3.54* 3.52*  CALCIUM 8.9 8.9 8.6   LFT No results found for this basename: PROT, ALBUMIN, AST, ALT, ALKPHOS, BILITOT, BILIDIR, IBILI,  in the last 72 hours No results found.  ROS: Appetite fair NO CP + SOB better with O2 No change in bowels No change in vision Numbness in fingers on Rt hand since placement of AVF No new arthritic CO's  PHYSICAL EXAM: Blood pressure 147/55, pulse 70, temperature 97.8 F (36.6 C), temperature source Oral, resp. rate 18, height 5\' 7"  (1.702 m), weight 163.975 kg (361 lb 8 oz), SpO2 95.00%. HEENT: PERRLA EOMI NECK:+ JVD LUNGS:Decreased BS bil with faint basilar crackles CARDIAC:RRR wo MRG ABD:+ BS NTND No overt HSM, Obese EXT:2+ edema   Rt forearm AVF + bruit but no great palpable vein NEURO:CNI  M&Sintact  No asterixis  Assessment: 1. CKD4 nearing ESRD due to inability to adequately control volume with large doses of diuretics 2. Diastolic and Rt sided heart  failure 3. Mild Sec HPTH not requiring 4. Anemia 5. DM 6. HTN PLAN: 1. Due the multiple failures of controlling volume despite maximal diuretics.  He has had multiple hospital admissions and his QOL has been significantly impacted by this.  I think it is in his best interest medically to start HD now (despite a SCr of 3.8) to prevent further hospitalizations. 2. Check CXR 3. Start aranesp 4. Ask VVS to see for permcath 5. Recheck iron studies 6. Start nephrovite   Timothy Banks 03/11/2013, 11:39 AM

## 2013-03-11 NOTE — Progress Notes (Signed)
[facility] Progress Note  Transferred from SDU to floor 5/7   Timothy Banks WUJ:[id number] DOB: May 29, 1956 DOA: 03/09/2013 PCP: Luanna Salk, MD  Brief narrative: 57 year old male patient with known evolving chronic kidney disease. Now stage V. Has AV fistula recently placed for anticipated initiation of hemodialysis likely within the next 12 months. He has been admitted to the hospital multiple times in the month of April: Initially on 4/11 and again on 4/15 and finally on 4/28. He has chronic dyspnea which becomes worse and when he becomes volume overloaded. Patient endorses has used BiPAP in the past at night which has helped with his symptoms. He was transferred from [facility] because of anticipation of potential initiation of dialysis this admission but per the admitting physician's notes no one from the [facility] contacted the nephrologist to determine if they would be initiating dialysis during this admission. His initial chest x-ray was consistent with volume overload and patient was otherwise stable nasal cannula oxygen. After arrival to this facility the admitting physician spoke with Dr. d who recommended continuing to diurese with Lasix but utilizing a higher dose of 160 mg by mouth 4 times a day and adding Zaroxolyn. Patient endorsed was on 60 mg of Lasix twice daily at home.  Assessment/Plan: Active Problems:   Acute respiratory failure with hypoxia due to:   A)  Pulmonary edema   B)  Volume overload -third admission in 1 month with volume overload -diuresing well with very high dose Lasix w/ Zaroxolyn  - negative 3.5L since admission -d/w pt and wife need to adhere to prescribed regimens including free water and Na+ restriction    Type II or unspecified type diabetes mellitus with unspecified complication, uncontrolled -cont SSI and follow -FU HgA1c    CKD (chronic kidney disease) stage 4, GFR 15-29 ml/min - with recurrent volume overload, Renal  consulted - pt has fistula in right forearm from end of April    Leukocytosis -no fever or obvious source seen, likely reactive    Morbid obesity   HTN -cont Norvasc, Imdur and Lopressor   DVT prophylaxis: Subcutaneous heparin Code Status: Full Family Communication: Patient and wife at bedside Disposition Plan: home when improved Isolation: Contact isolation for MRSA positive PCR status  Consultants: Nephrology  Procedures: None  Antibiotics: None  HPI/Subjective: Breathing better than yesterday   Objective: Blood pressure 135/46, pulse 72, temperature 97.8 F (36.6 C), temperature source Oral, resp. rate 22, height 5\' 7"  (1.702 m), weight 163.975 kg (361 lb 8 oz), SpO2 99.00%.  Intake/Output Summary (Last 24 hours) at 03/11/13 1003 Last data filed at 03/11/13 0600  Gross per 24 hour  Intake    720 ml  Output   2550 ml  Net  -1830 ml     Exam: General: No acute respiratory distress Lungs: wet crackles in both lower lungs Cardiovascular: Regular rate and rhythm without murmur gallop or rub normal S1 and S2,  Abdomen: Nontender, nondistended, soft, bowel sounds positive, no rebound, no ascites, no appreciable mass Musculoskeletal: No significant cyanosis, clubbing of bilateral lower extremities 1-2 plus edema b/l Neurological: Alert and oriented x 3, moves all extremities x 4 without focal neurological deficits, CN 2-12 intact  Data Reviewed: Basic Metabolic Panel:  Recent Labs Lab 03/05/13 0440 03/06/13 0650 03/09/13 1939 03/10/13 0630 03/11/13 0440  NA 135 135 136 135 134*  K 4.0 3.9 4.9 4.7 3.8  CL 90* 90* 91* 89* 88*  CO2 35* 37* 33* 32 34*  GLUCOSE 175* 219*  124* 144* 115*  BUN 87* 76* 71* 73* 73*  CREATININE 2.99* 2.75* 3.46* 3.54* 3.52*  CALCIUM 8.5 8.2* 8.9 8.9 8.6  PHOS 4.4 4.1  --   --   --    Liver Function Tests:  Recent Labs Lab 03/05/13 0440 03/06/13 0650  ALBUMIN 2.9* 2.9*   No results found for this basename: LIPASE,  AMYLASE,  in the last 168 hours No results found for this basename: AMMONIA,  in the last 168 hours CBC:  Recent Labs Lab 03/04/13 2319 03/09/13 1939  WBC 11.5* 13.0*  HGB 8.6* 10.0*  HCT 27.8* 32.0*  MCV 83.2 85.8  PLT 268 228   Cardiac Enzymes: No results found for this basename: CKTOTAL, CKMB, CKMBINDEX, TROPONINI,  in the last 168 hours BNP (last 3 results)  Recent Labs  03/02/13 2031  PROBNP 12262.0*   CBG:  Recent Labs Lab 03/09/13 2150 03/10/13 0852 03/10/13 1107 03/10/13 1639 03/10/13 2240  GLUCAP 177* 198* 216* 150* 131*    No results found for this or any previous visit (from the past 240 hour(s)).   Studies:  Recent x-ray studies have been reviewed in detail by the Attending Physician  Scheduled Meds:  Aileen Amore,MD [phone]  03/11/2013, 10:03 AM

## 2013-03-11 NOTE — Care Management Note (Signed)
   CARE MANAGEMENT NOTE 03/11/2013  Patient:  Timothy Banks, Timothy Banks   Account Number:  [id number]  Date Initiated:  03/10/2013  Documentation initiated by:  MAYO,HENRIETTA  Subjective/Objective Assessment:   57 yr-old male adm with dx of acute resp failure; lives with spouse, has home O2 through [employer], active with [facility]     Action/Plan:   Anticipated DC Date:     Anticipated DC Plan:  HOME W HOME HEALTH SERVICES      DC Planning Services  CM consult      Choice offered to / List presented to:             Status of service:   Medicare Important Message given?   (If response is "NO", the following Medicare IM given date fields will be blank) Date Medicare IM given:   Date Additional Medicare IM given:    Discharge Disposition:    Per UR Regulation:  Reviewed for med. necessity/level of care/duration of stay  If discussed at Long Length of Stay Meetings, dates discussed:    Comments:  03/11/2013 Met with pt and wife re [facility] for CHF program, states that his oxygen is from [facility], however per pt and wife it was not working correctly, pt and wife were asked to call [facility] to inform them of concentrator malfunction and arrange a time for them to come check the concentrator prior to Pt d/c. Wife also reminded to bring in oxygen tank for pt to use when ready to d/c to home. Pt states that he will start dialysis tomorrow. If this is correct then pt will remain inpt for several more days until outpatient hemodialysis is setup. Will continue to follow, noted that pt was setup with [facility] for CHF program. Johny Shock RN MPH Case Manager [phone]  03/10/13 1150 [address] RN MSN BSN CCM Pt was referred to [facility] congestive heart failure program when discharged on 03/06/13.  TC to [facility] to notify re readmission.

## 2013-03-12 ENCOUNTER — Inpatient Hospital Stay (HOSPITAL_COMMUNITY): Payer: Medicare Other

## 2013-03-12 ENCOUNTER — Encounter (HOSPITAL_COMMUNITY)
Admission: AD | Disposition: A | Discharge status: Home / Self Care | Payer: Self-pay | Source: Other Acute Inpatient Hospital | Attending: Internal Medicine

## 2013-03-12 ENCOUNTER — Encounter (HOSPITAL_COMMUNITY): Payer: Self-pay | Admitting: Anesthesiology

## 2013-03-12 ENCOUNTER — Inpatient Hospital Stay (HOSPITAL_COMMUNITY): Payer: Medicare Other | Admitting: Anesthesiology

## 2013-03-12 DIAGNOSIS — N186 End stage renal disease: Secondary | ICD-10-CM

## 2013-03-12 HISTORY — PX: INSERTION OF DIALYSIS CATHETER: SHX1324

## 2013-03-12 LAB — HEPATITIS B SURFACE ANTIGEN: Hepatitis B Surface Ag: NEGATIVE

## 2013-03-12 LAB — RENAL FUNCTION PANEL
BUN: 75 mg/dL — ABNORMAL HIGH (ref 6–23)
CO2: 36 mEq/L — ABNORMAL HIGH (ref 19–32)
Chloride: 87 mEq/L — ABNORMAL LOW (ref 96–112)
Creatinine, Ser: 3.22 mg/dL — ABNORMAL HIGH (ref 0.50–1.35)
GFR calc non Af Amer: 20 mL/min — ABNORMAL LOW (ref >90)

## 2013-03-12 LAB — GLUCOSE, CAPILLARY
Glucose-Capillary: 124 mg/dL — ABNORMAL HIGH (ref 70–99)
Glucose-Capillary: 115 mg/dL — ABNORMAL HIGH (ref 70–99)
Glucose-Capillary: 118 mg/dL — ABNORMAL HIGH (ref 70–99)
Glucose-Capillary: 118 mg/dL — ABNORMAL HIGH (ref 70–99)

## 2013-03-12 SURGERY — INSERTION OF DIALYSIS CATHETER
Anesthesia: Monitor Anesthesia Care | Laterality: Right | Wound class: Clean

## 2013-03-12 MED ORDER — OXYCODONE-ACETAMINOPHEN 5-325 MG PO TABS: 1.0000 | ORAL_TABLET | ORAL | Status: DC | PRN

## 2013-03-12 MED ORDER — OXYCODONE-ACETAMINOPHEN 5-325 MG PO TABS
1.0000 | ORAL_TABLET | Freq: Once | ORAL | Status: AC
  Administered 2013-03-12: 1 via ORAL

## 2013-03-12 MED ORDER — FENTANYL CITRATE 0.05 MG/ML IJ SOLN
INTRAMUSCULAR | Status: DC | PRN
  Administered 2013-03-12: 25 ug via INTRAVENOUS

## 2013-03-12 MED ORDER — CEFAZOLIN SODIUM 1-5 GM-% IV SOLN
1.0000 g | Freq: Three times a day (TID) | INTRAVENOUS | Status: DC
  Administered 2013-03-12 – 2013-03-14 (×5): 1 g via INTRAVENOUS
  Filled 2013-03-12 (×9): qty 50

## 2013-03-12 MED ORDER — HEPARIN SODIUM (PORCINE) 5000 UNIT/ML IJ SOLN
INTRAMUSCULAR | Status: DC | PRN
  Administered 2013-03-12: 12:00:00

## 2013-03-12 MED ORDER — CEFAZOLIN SODIUM 1-5 GM-% IV SOLN
INTRAVENOUS | Status: AC
  Filled 2013-03-12: qty 50

## 2013-03-12 MED ORDER — DARBEPOETIN ALFA-POLYSORBATE 60 MCG/0.3ML IJ SOLN
INTRAMUSCULAR | Status: AC
  Administered 2013-03-12: 60 ug via INTRAVENOUS
  Filled 2013-03-12: qty 0.3

## 2013-03-12 MED ORDER — SODIUM CHLORIDE 0.9 % IV SOLN
1020.0000 mg | Freq: Once | INTRAVENOUS | Status: DC
  Filled 2013-03-12: qty 34

## 2013-03-12 MED ORDER — OXYCODONE HCL 5 MG PO TABS
ORAL_TABLET | ORAL | Status: AC
  Filled 2013-03-12: qty 1

## 2013-03-12 MED ORDER — HEPARIN SODIUM (PORCINE) 1000 UNIT/ML IJ SOLN
INTRAMUSCULAR | Status: DC | PRN
  Administered 2013-03-12: 4.6 mL

## 2013-03-12 MED ORDER — ONDANSETRON HCL 4 MG/2ML IJ SOLN: 4.0000 mg | Freq: Once | INTRAMUSCULAR | Status: DC | PRN

## 2013-03-12 MED ORDER — LIDOCAINE HCL (CARDIAC) 20 MG/ML IV SOLN
INTRAVENOUS | Status: DC | PRN
  Administered 2013-03-12: 50 mg via INTRAVENOUS

## 2013-03-12 MED ORDER — 0.9 % SODIUM CHLORIDE (POUR BTL) OPTIME
TOPICAL | Status: DC | PRN
  Administered 2013-03-12: 1000 mL

## 2013-03-12 MED ORDER — HYDROMORPHONE HCL PF 1 MG/ML IJ SOLN: 0.2500 mg | INTRAMUSCULAR | Status: DC | PRN

## 2013-03-12 MED ORDER — LIDOCAINE-EPINEPHRINE (PF) 1 %-1:200000 IJ SOLN
INTRAMUSCULAR | Status: DC | PRN
  Administered 2013-03-12: 30 mL

## 2013-03-12 MED ORDER — SODIUM CHLORIDE 0.9 % IV SOLN
INTRAVENOUS | Status: DC | PRN
  Administered 2013-03-12: 12:00:00 via INTRAVENOUS

## 2013-03-12 MED ORDER — PROPOFOL INFUSION 10 MG/ML OPTIME
INTRAVENOUS | Status: DC | PRN
  Administered 2013-03-12: 50 ug/kg/min via INTRAVENOUS

## 2013-03-12 SURGICAL SUPPLY — 41 items
BAG DECANTER FOR FLEXI CONT (MISCELLANEOUS) ×2 IMPLANT
CATH CANNON HEMO 15F 50CM (CATHETERS) IMPLANT
CATH CANNON HEMO 15FR 19 (HEMODIALYSIS SUPPLIES) IMPLANT
CATH CANNON HEMO 15FR 23CM (HEMODIALYSIS SUPPLIES) ×1 IMPLANT
CATH CANNON HEMO 15FR 31CM (HEMODIALYSIS SUPPLIES) IMPLANT
CATH CANNON HEMO 15FR 32CM (HEMODIALYSIS SUPPLIES) IMPLANT
CHLORAPREP W/TINT 26ML (MISCELLANEOUS) ×2 IMPLANT
CLOTH BEACON ORANGE TIMEOUT ST (SAFETY) ×2 IMPLANT
COVER PROBE W GEL 5X96 (DRAPES) IMPLANT
COVER SURGICAL LIGHT HANDLE (MISCELLANEOUS) ×2 IMPLANT
DRAPE C-ARM 42X72 X-RAY (DRAPES) ×2 IMPLANT
DRAPE CHEST BREAST 15X10 FENES (DRAPES) ×2 IMPLANT
GAUZE SPONGE 2X2 8PLY STRL LF (GAUZE/BANDAGES/DRESSINGS) ×1 IMPLANT
GAUZE SPONGE 4X4 16PLY XRAY LF (GAUZE/BANDAGES/DRESSINGS) ×1 IMPLANT
GLOVE BIO SURGEON STRL SZ7.5 (GLOVE) ×2 IMPLANT
GLOVE BIOGEL PI IND STRL 8 (GLOVE) ×1 IMPLANT
GLOVE BIOGEL PI INDICATOR 8 (GLOVE) ×1
GOWN STRL NON-REIN LRG LVL3 (GOWN DISPOSABLE) ×4 IMPLANT
KIT BASIN OR (CUSTOM PROCEDURE TRAY) ×2 IMPLANT
KIT ROOM TURNOVER OR (KITS) ×2 IMPLANT
NDL 18GX1X1/2 (RX/OR ONLY) (NEEDLE) ×1 IMPLANT
NDL HYPO 25GX1X1/2 BEV (NEEDLE) ×1 IMPLANT
NEEDLE 18GX1X1/2 (RX/OR ONLY) (NEEDLE) ×2 IMPLANT
NEEDLE 22X1 1/2 (OR ONLY) (NEEDLE) ×2 IMPLANT
NEEDLE HYPO 25GX1X1/2 BEV (NEEDLE) IMPLANT
NS IRRIG 1000ML POUR BTL (IV SOLUTION) ×2 IMPLANT
PACK CV ACCESS (CUSTOM PROCEDURE TRAY) ×1 IMPLANT
PACK SURGICAL SETUP 50X90 (CUSTOM PROCEDURE TRAY) ×1 IMPLANT
PAD ARMBOARD 7.5X6 YLW CONV (MISCELLANEOUS) ×4 IMPLANT
SPONGE GAUZE 2X2 STER 10/PKG (GAUZE/BANDAGES/DRESSINGS) ×1
SUT ETHILON 3 0 PS 1 (SUTURE) ×2 IMPLANT
SUT VICRYL 4-0 PS2 18IN ABS (SUTURE) ×2 IMPLANT
SYR 20CC LL (SYRINGE) ×3 IMPLANT
SYR 30ML LL (SYRINGE) IMPLANT
SYR 5ML LL (SYRINGE) ×4 IMPLANT
SYR CONTROL 10ML LL (SYRINGE) IMPLANT
SYRINGE 10CC LL (SYRINGE) ×2 IMPLANT
TAPE CLOTH SURG 4X10 WHT LF (GAUZE/BANDAGES/DRESSINGS) ×2 IMPLANT
TOWEL OR 17X24 6PK STRL BLUE (TOWEL DISPOSABLE) ×2 IMPLANT
TOWEL OR 17X26 10 PK STRL BLUE (TOWEL DISPOSABLE) ×2 IMPLANT
WATER STERILE IRR 1000ML POUR (IV SOLUTION) ×2 IMPLANT

## 2013-03-12 NOTE — Progress Notes (Signed)
03/12/2013 9:09 AM Hemodialysis Outpatient Note; This patient has been accepted at the [facility] on a Tues/Thurs/Sat 2nd shift schedule. The center prefers not to begin the patient on a Sat so they plan 1st treatment for Tues May 13 and the patient will need to be there at 10:30 AM. Thank you.Timothy Banks

## 2013-03-12 NOTE — Preoperative (Signed)
Beta Blockers   Reason not to administer Beta Blockers:Not Applicable, took last pm

## 2013-03-12 NOTE — Progress Notes (Signed)
Brief Nutrition Note  RD received consult for new HD nutrition education.  Pt currently off unit undergoing placement of Diatek catheter. Pt to also have first HD treatment later today, per chart review. RD discussed need of education with wife and she is requesting that RD discuss education with patient when he is available.  RD to follow along to complete education at more appropriate time.  Timothy Motto MS, RD, LDN Pager: [phone] After-hours pager: [phone]

## 2013-03-12 NOTE — Anesthesia Postprocedure Evaluation (Signed)
  Anesthesia Post-op Note  Patient: Timothy Banks  Procedure(s) Performed: Procedure(s): ultrasound guided INSERTION OF DIALYSIS CATHETER right internal jugular using arrow 23cm (Right)  Patient Location: PACU  Anesthesia Type:MAC  Level of Consciousness: awake, alert , oriented and patient cooperative  Airway and Oxygen Therapy: Patient Spontanous Breathing  Post-op Pain: none  Post-op Assessment: Post-op Vital signs reviewed, Patient's Cardiovascular Status Stable, Respiratory Function Stable, Patent Airway, No signs of Nausea or vomiting and Pain level controlled  Post-op Vital Signs: stable  Complications: No apparent anesthesia complications

## 2013-03-12 NOTE — Anesthesia Preprocedure Evaluation (Addendum)
Anesthesia Evaluation  Patient identified by MRN, date of birth, ID band Patient awake    Reviewed: Allergy & Precautions, H&P , NPO status , Patient's Chart, lab work & pertinent test results, reviewed documented beta blocker date and time   Airway Mallampati: III TM Distance: >3 FB Neck ROM: Full    Dental  (+) Edentulous Upper and Dental Advisory Given,    Pulmonary shortness of breath and with exertion, asthma ,          Cardiovascular Exercise Tolerance: Poor hypertension, Pt. on medications and Pt. on home beta blockers + CAD, + Past MI, + CABG and +CHF     Neuro/Psych    GI/Hepatic negative GI ROS, Neg liver ROS,   Endo/Other  diabetes, Type obesity  Renal/GU ESRF and DialysisRenal disease     Musculoskeletal   Abdominal (+) + obese,   Peds  Hematology   Anesthesia Other Findings   Reproductive/Obstetrics                         Anesthesia Physical Anesthesia Plan  ASA: III  Anesthesia Plan: MAC   Post-op Pain Management:    Induction: Intravenous  Airway Management Planned: Simple Face Mask  Additional Equipment:   Intra-op Plan:   Post-operative Plan:   Informed Consent: I have reviewed the patients History and Physical, chart, labs and discussed the procedure including the risks, benefits and alternatives for the proposed anesthesia with the patient or authorized representative who has indicated his/her understanding and acceptance.     Plan Discussed with: CRNA, Anesthesiologist and Surgeon  Anesthesia Plan Comments:         Anesthesia Quick Evaluation

## 2013-03-12 NOTE — Op Note (Signed)
03/12/2013  PREOP DIAGNOSIS: Chronic kidney disease  POSTOP DIAGNOSIS: Chronic kidney disease  PROCEDURE: Ultrasound guided placement of right IJ Diatek catheter  SURGEON: Di Kindle. Edilia Bo, MD, FACS  ASSIST: none  ANESTHESIA: local with sedation   EBL: minimal  FINDINGS: patent right IJ  INDICATIONS: This patient is to begin dialysis. Right forearm fistula is not ready for cannulation. I was asked to place a tunneled dialysis catheter.  TECHNIQUE: The patient was taken to the operating room and sedated by anesthesia. The neck and upper chest were prepped and draped in the usual sterile fashion. After the skin was anesthetized with 1% lidocaine, and under ultrasound guidance, the right  IJ was cannulated and a guidewire introduced into the superior vena cava under fluoroscopic control. The tract over the wire was dilated and then the dilator and peel-away sheath were passed over the wire and the wire and dilator removed. The catheter was passed through the peel-away sheath and positioned in the right atrium. The exit site for the catheter was selected and the skin anesthetized between the 2 areas. The catheter was then brought through the tunnel, cut to the appropriate length, and the distal ports were attached. Both ports withdrew easily, were then flushed with heparinized saline and filled with concentrated heparin. The catheter was secured at its exit site with a 3-0 nylon suture. The IJ cannulation site was closed with a 4-0 subcuticular stitch. A sterile dressing was applied. The patient tolerated the procedure well and was transferred to the recovery room in stable condition. All needle and sponge counts were correct.  Waverly Ferrari, MD, FACS [facility] [place]  DATE OF OPERATION: 03/12/2013 DATE OF DICTATION: 03/12/2013

## 2013-03-12 NOTE — Transfer of Care (Signed)
Immediate Anesthesia Transfer of Care Note  Patient: Timothy Banks  Procedure(s) Performed: Procedure(s): ultrasound guided INSERTION OF DIALYSIS CATHETER right internal jugular using arrow 23cm (Right)  Patient Location: PACU  Anesthesia Type:MAC  Level of Consciousness: awake, alert , oriented and patient cooperative  Airway & Oxygen Therapy: Patient Spontanous Breathing and Patient connected to face mask oxygen  Post-op Assessment: Report given to PACU RN and Post -op Vital signs reviewed and stable  Post vital signs: Reviewed and stable  Complications: No apparent anesthesia complications

## 2013-03-12 NOTE — Progress Notes (Signed)
S: no new CO  todays labs not back? O:BP 148/62  Pulse 66  Temp(Src) 98.6 F (37 C) (Oral)  Resp 20  Ht 5\' 7"  (1.702 m)  Wt 161.707 kg (356 lb 8 oz)  BMI 55.82 kg/m2  SpO2 96%  Intake/Output Summary (Last 24 hours) at 03/12/13 0851 Last data filed at 03/12/13 0960  Gross per 24 hour  Intake      0 ml  Output   3225 ml  Net  -3225 ml   Weight change: -2.268 kg (-5 lb) AVW:UJWJX and alert CVS:RRR Resp:Decreased BS bases BJY:NWGNF + BS NTNT Ext:2-3 + edema  Rt AVF + bruit NEURO:CNI OX3 no asterixis   . amLODipine  5 mg Oral QHS  . darbepoetin (ARANESP) injection - DIALYSIS  60 mcg Intravenous Q Thu-HD  . furosemide  160 mg Oral Q6H  . heparin  5,000 Units Subcutaneous Q8H  . insulin aspart  0-5 Units Subcutaneous QHS  . insulin aspart  0-9 Units Subcutaneous TID WC  . isosorbide mononitrate  30 mg Oral Daily  . metolazone  10 mg Oral Daily  . metoprolol  50 mg Oral BID  . multivitamin  1 tablet Oral QHS  . simvastatin  20 mg Oral QPM  . sodium chloride  3 mL Intravenous Q12H   Dg Chest Port 1 View  03/11/2013  *RADIOLOGY REPORT*  Clinical Data: Congestive heart failure.  Shortness of breath.  PORTABLE CHEST - 1 VIEW  Comparison: 05/05 and 03/08/2013  Findings: Persistent cardiomegaly.  Pulmonary vascularity is more prominent than on the prior study.  However, the slight pulmonary edema at the right lung base has resolved.  No appreciable effusions.  IMPRESSION: Increased pulmonary vascular congestion. Resolved edema at the right lung base.   Original Report Authenticated By: Francene Boyers, M.D.    BMET    Component Value Date/Time   NA 134* 03/11/2013 0440   K 3.8 03/11/2013 0440   CL 88* 03/11/2013 0440   CO2 34* 03/11/2013 0440   GLUCOSE 115* 03/11/2013 0440   BUN 73* 03/11/2013 0440   CREATININE 3.52* 03/11/2013 0440   CALCIUM 8.6 03/11/2013 0440   GFRNONAA 18* 03/11/2013 0440   GFRAA 21* 03/11/2013 0440   CBC    Component Value Date/Time   WBC 13.0* 03/09/2013 1939   RBC 3.73*  03/09/2013 1939   HGB 10.0* 03/09/2013 1939   HCT 32.0* 03/09/2013 1939   PLT 228 03/09/2013 1939   MCV 85.8 03/09/2013 1939   MCH 26.8 03/09/2013 1939   MCHC 31.3 03/09/2013 1939   RDW 19.6* 03/09/2013 1939   LYMPHSABS 0.7 02/17/2013 1130   MONOABS 0.9 02/17/2013 1130   EOSABS 0.4 02/17/2013 1130   BASOSABS 0.0 02/17/2013 1130     Assessment: 1. 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  He will do much better with HD to help control fluid and thus will now consider him ESRD 2. Anemia  Iron sats low 3. Sec HPTH  PYH 92  Not requiring Vit D 4. DM 5. HTN Plan: 1 .IV Iron 2.  Cont PO lasix for now 3.  To have permcath and HD today 4.  Clip process started to get outpt spot  Jinger Middlesworth T

## 2013-03-12 NOTE — H&P (Signed)
[facility]  Patient name: Timothy Banks MRN: [id number] DOB: Nov 25, 1955 Sex: male  REASON FOR CONSULT: needs HD catheter  HPI: IVERY Banks is a 57 y.o. male who is not yet on HD. Has has an AVF which is not ready to use.   Past Medical History  Diagnosis Date  . Myocardial infarction   . Diabetes mellitus without complication   . CHF (congestive heart failure)   . Asthma   . Hyperlipidemia   . Hypertension   . Chronic kidney disease 02/17/2013    ACUTE RENAL    Family History  Problem Relation Age of Onset  . Diabetes Mother   . Heart disease Mother   . Diabetes Father   . Heart disease Father    SOCIAL HISTORY: History  Substance Use Topics  . Smoking status: Former Smoker -- 1.00 packs/day for 25 years    Start date: 11/05/1973  . Smokeless tobacco: Never Used     Comment: quit for 10 years, started back 9 mo ago  . Alcohol Use: No    No Known Allergies  Current Facility-Administered Medications  Medication Dose Route Frequency Provider Last Rate Last Dose  . 0.9 %  sodium chloride infusion  250 mL Intravenous PRN Joseph Art, DO      . acetaminophen (TYLENOL) tablet 650 mg  650 mg Oral Q4H PRN Joseph Art, DO      . albuterol (PROVENTIL HFA;VENTOLIN HFA) 108 (90 BASE) MCG/ACT inhaler 2 puff  2 puff Inhalation Q6H PRN Joseph Art, DO      . amLODipine (NORVASC) tablet 5 mg  5 mg Oral QHS Joseph Art, DO   5 mg at 03/11/13 2212  . darbepoetin (ARANESP) injection 60 mcg  60 mcg Intravenous Q Thu-HD Dyke Maes, MD      . ferumoxytol [facility]) 1,020 mg in sodium chloride 0.9 % 100 mL IVPB  1,020 mg Intravenous Once Dyke Maes, MD      . furosemide (LASIX) tablet 160 mg  160 mg Oral Q6H Joseph Art, DO   160 mg at 03/12/13 0865  . heparin injection 5,000 Units  5,000 Units Subcutaneous Q8H Joseph Art, DO   5,000 Units at 03/11/13 1337  . insulin aspart (novoLOG) injection 0-5 Units  0-5 Units  Subcutaneous QHS Jessica U Vann, DO      . insulin aspart (novoLOG) injection 0-9 Units  0-9 Units Subcutaneous TID WC Joseph Art, DO   1 Units at 03/12/13 0843  . isosorbide mononitrate (IMDUR) 24 hr tablet 30 mg  30 mg Oral Daily Joseph Art, DO   30 mg at 03/11/13 0919  . metolazone (ZAROXOLYN) tablet 10 mg  10 mg Oral Daily Joseph Art, DO   10 mg at 03/11/13 0919  . metoprolol (LOPRESSOR) tablet 50 mg  50 mg Oral BID Joseph Art, DO   50 mg at 03/11/13 2212  . multivitamin (RENA-VIT) tablet 1 tablet  1 tablet Oral QHS Dyke Maes, MD   1 tablet at 03/11/13 2212  . nitroGLYCERIN (NITROSTAT) SL tablet 0.4 mg  0.4 mg Sublingual Q5 min PRN Joseph Art, DO      . ondansetron (ZOFRAN) injection 4 mg  4 mg Intravenous Q6H PRN Joseph Art, DO      . oxyCODONE-acetaminophen (PERCOCET/ROXICET) 5-325 MG per tablet 1 tablet  1 tablet Oral Q8H PRN Joseph Art, DO   1  tablet at 03/11/13 2227   And  . oxyCODONE (Oxy IR/ROXICODONE) immediate release tablet 5 mg  5 mg Oral Q8H PRN Joseph Art, DO   5 mg at 03/12/13 0842  . simvastatin (ZOCOR) tablet 20 mg  20 mg Oral QPM Jessica U Vann, DO   20 mg at 03/11/13 1738  . sodium chloride 0.9 % injection 3 mL  3 mL Intravenous Q12H Jessica U Vann, DO   3 mL at 03/11/13 2200  . sodium chloride 0.9 % injection 3 mL  3 mL Intravenous PRN Joseph Art, DO       REVIEW OF SYSTEMS: [name] ] denotes positive finding; [  ] denotes negative finding CARDIOVASCULAR:  [ ]  chest pain   [ ]  chest pressure   [ ]  palpitations   [ ]  orthopnea   [ ]  dyspnea on exertion   [ ]  claudication   [ ]  rest pain   [ ]  DVT   [ ]  phlebitis PULMONARY:   [ ]  productive cough   [ ]  asthma   [ ]  wheezing NEUROLOGIC:   [ ]  weakness  [ ]  paresthesias  [ ]  aphasia  [ ]  amaurosis  [ ]  dizziness HEMATOLOGIC:   [ ]  bleeding problems   [ ]  clotting disorders MUSCULOSKELETAL:  [ ]  joint pain   [ ]  joint swelling [ ]  leg swelling GASTROINTESTINAL: [ ]   blood in stool  [ ]    hematemesis GENITOURINARY:  [ ]   dysuria  [ ]   hematuria PSYCHIATRIC:  [ ]  history of major depression INTEGUMENTARY:  [ ]  rashes  [ ]  ulcers CONSTITUTIONAL:  [ ]  fever   [ ]  chills  PHYSICAL EXAM: Filed Vitals:   03/11/13 1800 03/11/13 2111 03/12/13 0413 03/12/13 1016  BP: 155/45 168/68 148/62 153/67  Pulse: 71 73 66 72  Temp: 98.4 F (36.9 C) 98.7 F (37.1 C) 98.6 F (37 C) 98.1 F (36.7 C)  TempSrc: Oral Oral Oral Oral  Resp:  20 20 20   Height:      Weight:  356 lb 8 oz (161.707 kg)    SpO2:  93% 96% 94%   Body mass index is 55.82 kg/(m^2). GENERAL: The patient is a well-nourished male, in no acute distress. The vital signs are documented above. CARDIOVASCULAR: There is a regular rate and rhythm.  PULMONARY: There is good air exchange bilaterally without wheezing or rales. ABDOMEN: Soft and non-tender with normal pitched bowel sounds.  MUSCULOSKELETAL: There are no major deformities or cyanosis. NEUROLOGIC: No focal weakness or paresthesias are detected. SKIN: There are no ulcers or rashes noted. PSYCHIATRIC: The patient has a normal affect.  DATA:  Lab Results  Component Value Date   WBC 13.0* 03/09/2013   HGB 10.0* 03/09/2013   HCT 32.0* 03/09/2013   MCV 85.8 03/09/2013   PLT 228 03/09/2013   Lab Results  Component Value Date   NA 134* 03/11/2013   K 3.8 03/11/2013   CL 88* 03/11/2013   CO2 34* 03/11/2013   Lab Results  Component Value Date   CREATININE 3.52* 03/11/2013   No results found for this basename: INR, PROTIME   Lab Results  Component Value Date   HGBA1C 5.8* 03/10/2013   CBG (last 3)   Recent Labs  03/11/13 1658 03/11/13 2115 03/12/13 0749  GLUCAP 183* 147* 124*   MEDICAL ISSUES: Plan placement of Diatek catheter.  I have discussed the indications for placement of a Diatek catheter. I have also discussed the  potential complications, including but not limited to arterial or venous injury, infection, or pneumothorax. All of the patients questions were  answered and they are agreeable to proceed.    DICKSON,CHRISTOPHER S [facility] [place] Beeper: [phone]

## 2013-03-13 ENCOUNTER — Encounter (HOSPITAL_COMMUNITY): Payer: Self-pay | Admitting: Vascular Surgery

## 2013-03-13 LAB — GLUCOSE, CAPILLARY
Glucose-Capillary: 136 mg/dL — ABNORMAL HIGH (ref 70–99)
Glucose-Capillary: 241 mg/dL — ABNORMAL HIGH (ref 70–99)

## 2013-03-13 LAB — BASIC METABOLIC PANEL
BUN: 55 mg/dL — ABNORMAL HIGH (ref 6–23)
CO2: 34 mEq/L — ABNORMAL HIGH (ref 19–32)
Calcium: 9 mg/dL (ref 8.4–10.5)
Creatinine, Ser: 2.85 mg/dL — ABNORMAL HIGH (ref 0.50–1.35)
Glucose, Bld: 144 mg/dL — ABNORMAL HIGH (ref 70–99)

## 2013-03-13 NOTE — Progress Notes (Signed)
03/13/2013 6:10 PM  This is a late entry.  Gave patient's wife the phone number to call when they got home tomorrow so that she can get the patient's oxygen concentrator fixed.  I instructed her to call as soon as she got home so that the issue can be resolved as quickly as possible.  The RN case manager will arrange for an oxygen tank to be delivered to the hospital prior to DC tomorrow.  Pt wife verbalized understanding.  Eunice Blase

## 2013-03-13 NOTE — Plan of Care (Signed)
Problem: Food- and Nutrition-Related Knowledge Deficit (NB-1.1) Goal: Nutrition education Formal process to instruct or train a patient/client in a skill or to impart knowledge to help patients/clients voluntarily manage or modify food choices and eating behavior to maintain or improve health. Outcome: Completed/Met Date Met:  03/13/13  Nutrition Education Note  RD consulted for Renal Education. Provided Choose-A-Meal Booklet to patient/family. Reviewed food groups and provided written recommended serving sizes specifically determined for patient's current nutritional status.   Explained why diet restrictions are needed and provided lists of foods to limit/avoid that are high potassium, sodium, and phosphorus. Provided specific recommendations on safer alternatives of these foods. Strongly encouraged compliance of this diet.   Discussed importance of protein intake at each meal and snack. Provided examples of how to maximize protein intake throughout the day. Discussed need for fluid restriction with dialysis, importance of minimizing weight gain between HD treatments, and renal-friendly beverage options.  Encouraged pt to discuss specific diet questions/concerns with RD at HD outpatient facility. Teach back method used.  Expect fair compliance.  Body mass index is 54.78 kg/(m^2). Pt meets criteria for Obese Class III (Extreme) based on current BMI.  Current diet order is Renal 80-90. Labs and medications reviewed. No further nutrition interventions warranted at this time. RD contact information provided. If additional nutrition issues arise, please re-consult RD.  Jarold Motto MS, RD, LDN Pager: [phone] After-hours pager: [phone]

## 2013-03-13 NOTE — Progress Notes (Signed)
[facility] Progress Note  Transferred from SDU to floor 5/7   Timothy Banks:[id number] DOB: Apr 08, 1956 DOA: 03/09/2013 PCP: Luanna Salk, MD  Brief narrative: 57 year old male patient with known evolving chronic kidney disease. Now stage V. Has AV fistula recently placed for anticipated initiation of hemodialysis likely within the next 12 months. He has been admitted to the hospital multiple times in the month of April: Initially on 4/11 and again on 4/15 and finally on 4/28. He has chronic dyspnea which becomes worse and when he becomes volume overloaded. Patient endorses has used BiPAP in the past at night which has helped with his symptoms. He was transferred from [facility] because of anticipation of potential initiation of dialysis this admission but per the admitting physician's notes no one from the [facility] contacted the nephrologist to determine if they would be initiating dialysis during this admission. His initial chest x-ray was consistent with volume overload and patient was otherwise stable nasal cannula oxygen. After arrival to this facility the admitting physician spoke with Dr. d who recommended continuing to diurese with Lasix but utilizing a higher dose of 160 mg by mouth 4 times a day and adding Zaroxolyn. Patient endorsed was on 60 mg of Lasix twice daily at home.  Assessment/Plan: Active Problems:   Acute respiratory failure with hypoxia due to:  Pulmonary edema/  Volume overload -third admission in 1 month with volume overload -given inability to manage volume with diuretics, started HD 5/8 -s/p HD catheter placement, HD again today and tomorrow -outpatient HD set up to start on Tuesday     Type II or unspecified type diabetes mellitus with unspecified complication, uncontrolled -cont SSI and follow -HbA1c 5.8    CKD (chronic kidney disease) stage 4, GFR 15-29 ml/min - as above - pt has fistula in right forearm from end of April   Leukocytosis -no fever or obvious source seen, likely reactive    Morbid obesity   HTN -cont Norvasc, Imdur and Lopressor   DVT prophylaxis: Subcutaneous heparin Code Status: Full Family Communication: Patient and wife at bedside Disposition Plan: home tomorrow after HD Isolation: Contact isolation for MRSA positive PCR status  Consultants: Nephrology  Procedures: None  Antibiotics: None  HPI/Subjective: Feels great, breathing much better   Objective: Blood pressure 142/53, pulse 73, temperature 97.8 F (36.6 C), temperature source Oral, resp. rate 18, height 5\' 7"  (1.702 m), weight 158.7 kg (349 lb 13.9 oz), SpO2 96.00%.  Intake/Output Summary (Last 24 hours) at 03/13/13 1552 Last data filed at 03/13/13 1100  Gross per 24 hour  Intake    243 ml  Output   2825 ml  Net  -2582 ml     Exam: General: No acute respiratory distress Lungs: improved  wet crackles in both lower lungs Cardiovascular: Regular rate and rhythm without murmur gallop or rub normal S1 and S2,  Abdomen: Nontender, nondistended, soft, bowel sounds positive, no rebound, no ascites, no appreciable mass Musculoskeletal: No significant cyanosis, clubbing of bilateral lower extremities 1 plus edema b/l Neurological: Alert and oriented x 3, moves all extremities x 4 without focal neurological deficits, CN 2-12 intact  Data Reviewed: Basic Metabolic Panel:  Recent Labs Lab 03/09/13 1939 03/10/13 0630 03/11/13 0440 03/12/13 1400 03/13/13 0655  NA 136 135 134* 136 136  K 4.9 4.7 3.8 3.4* 3.9  CL 91* 89* 88* 87* 92*  CO2 33* 32 34* 36* 34*  GLUCOSE 124* 144* 115* 126* 144*  BUN 71* 73* 73* 75* 55*  CREATININE 3.46* 3.54* 3.52* 3.22* 2.85*  CALCIUM 8.9 8.9 8.6 9.2 9.0  PHOS  --   --   --  4.7*  --    Liver Function Tests:  Recent Labs Lab 03/12/13 1400  ALBUMIN 3.2*   No results found for this basename: LIPASE, AMYLASE,  in the last 168 hours No results found for this basename:  AMMONIA,  in the last 168 hours CBC:  Recent Labs Lab 03/09/13 1939  WBC 13.0*  HGB 10.0*  HCT 32.0*  MCV 85.8  PLT 228   Cardiac Enzymes: No results found for this basename: CKTOTAL, CKMB, CKMBINDEX, TROPONINI,  in the last 168 hours BNP (last 3 results)  Recent Labs  03/02/13 2031  PROBNP 12262.0*   CBG:  Recent Labs Lab 03/12/13 1358 03/12/13 1807 03/12/13 2133 03/13/13 0801 03/13/13 1150  GLUCAP 118* 127* 212* 136* 241*    No results found for this or any previous visit (from the past 240 hour(s)).   Studies:  Recent x-ray studies have been reviewed in detail by the Attending Physician  Scheduled Meds:  Secundino Ellithorpe,MD [phone]  03/13/2013, 3:52 PM

## 2013-03-13 NOTE — Progress Notes (Signed)
S:  Tolerated HD well yest.  Feels better O:BP 125/51  Pulse 65  Temp(Src) 97.6 F (36.4 C) (Oral)  Resp 18  Ht 5\' 7"  (1.702 m)  Wt 158.7 kg (349 lb 13.9 oz)  BMI 54.78 kg/m2  SpO2 97%  Intake/Output Summary (Last 24 hours) at 03/13/13 1118 Last data filed at 03/13/13 1034  Gross per 24 hour  Intake    153 ml  Output   2675 ml  Net  -2522 ml   Weight change: -0.862 kg (-1 lb 14.4 oz) WUJ:WJXBJ and alert CVS:RRR Resp:Decreased BS bases YNW:GNFAO + BS NTNT Ext:2+ edema  Rt AVF + bruit NEURO:CNI OX3 no asterixis   . amLODipine  5 mg Oral QHS  . ceFAZolin  1 g Intravenous Q8H  . darbepoetin (ARANESP) injection - DIALYSIS  60 mcg Intravenous Q Thu-HD  . ferumoxytol  1,020 mg Intravenous Once  . furosemide  160 mg Oral Q6H  . heparin  5,000 Units Subcutaneous Q8H  . insulin aspart  0-5 Units Subcutaneous QHS  . insulin aspart  0-9 Units Subcutaneous TID WC  . isosorbide mononitrate  30 mg Oral Daily  . metolazone  10 mg Oral Daily  . metoprolol  50 mg Oral BID  . multivitamin  1 tablet Oral QHS  . simvastatin  20 mg Oral QPM  . sodium chloride  3 mL Intravenous Q12H   Dg Chest Port 1 View  03/12/2013  *RADIOLOGY REPORT*  Clinical Data: Status post dialysis catheter placement.  PORTABLE CHEST - 1 VIEW  Comparison: Single view of the chest 03/11/2013.  Findings: Double lumen right IJ approach dialysis catheter is in place.  Tip of the proximal lumen is in the superior vena cava with the distal lumen projecting at the superior cavoatrial junction. There is no pneumothorax.  Cardiomegaly and pulmonary edema are noted.  IMPRESSION:  1.  Dialysis catheter in place without evidence of complication. 2.  Pulmonary edema.   Original Report Authenticated By: Holley Dexter, M.D.    Dg Chest Port 1 View  03/11/2013  *RADIOLOGY REPORT*  Clinical Data: Congestive heart failure.  Shortness of breath.  PORTABLE CHEST - 1 VIEW  Comparison: 05/05 and 03/08/2013  Findings: Persistent  cardiomegaly.  Pulmonary vascularity is more prominent than on the prior study.  However, the slight pulmonary edema at the right lung base has resolved.  No appreciable effusions.  IMPRESSION: Increased pulmonary vascular congestion. Resolved edema at the right lung base.   Original Report Authenticated By: Francene Boyers, M.D.    Dg Fluoro Guide Cv Line-no Report  03/12/2013  CLINICAL DATA: Dialysis catheter insertion   FLOURO GUIDE CV LINE  Fluoroscopy was utilized by the requesting physician.  No radiographic  interpretation.     BMET    Component Value Date/Time   NA 136 03/13/2013 0655   K 3.9 03/13/2013 0655   CL 92* 03/13/2013 0655   CO2 34* 03/13/2013 0655   GLUCOSE 144* 03/13/2013 0655   BUN 55* 03/13/2013 0655   CREATININE 2.85* 03/13/2013 0655   CALCIUM 9.0 03/13/2013 0655   GFRNONAA 23* 03/13/2013 0655   GFRAA 27* 03/13/2013 0655   CBC    Component Value Date/Time   WBC 13.0* 03/09/2013 1939   RBC 3.73* 03/09/2013 1939   HGB 10.0* 03/09/2013 1939   HCT 32.0* 03/09/2013 1939   PLT 228 03/09/2013 1939   MCV 85.8 03/09/2013 1939   MCH 26.8 03/09/2013 1939   MCHC 31.3 03/09/2013 1939   RDW 19.6*  03/09/2013 1939   LYMPHSABS 0.7 02/17/2013 1130   MONOABS 0.9 02/17/2013 1130   EOSABS 0.4 02/17/2013 1130   BASOSABS 0.0 02/17/2013 1130     Assessment: 1. 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  He will do much better with HD to help control fluid and thus will now consider him ESRD 2. Anemia  Iron sats low 3. Sec HPTH  PTH 92  Not requiring Vit D 4. DM 5. HTN Plan: 1 .HD today and try to pull 3 liters 2.  Plan HD again in AM and plan to DC after HD.  He has a spot at [facility] 2nd shift  Andera Cranmer T

## 2013-03-13 NOTE — Progress Notes (Signed)
[facility] Progress Note  Transferred from SDU to floor 5/7   Timothy Banks WUJ:[id number] DOB: July 07, 1956 DOA: 03/09/2013 PCP: Luanna Salk, MD  Brief narrative: 57 year old male patient with known evolving chronic kidney disease. Now stage V. Has AV fistula recently placed for anticipated initiation of hemodialysis likely within the next 12 months. He has been admitted to the hospital multiple times in the month of April: Initially on 4/11 and again on 4/15 and finally on 4/28. He has chronic dyspnea which becomes worse and when he becomes volume overloaded. Patient endorses has used BiPAP in the past at night which has helped with his symptoms. He was transferred from [facility] because of anticipation of potential initiation of dialysis this admission but per the admitting physician's notes no one from the [facility] contacted the nephrologist to determine if they would be initiating dialysis during this admission. His initial chest x-ray was consistent with volume overload and patient was otherwise stable nasal cannula oxygen. After arrival to this facility the admitting physician spoke with Dr. d who recommended continuing to diurese with Lasix but utilizing a higher dose of 160 mg by mouth 4 times a day and adding Zaroxolyn. Patient endorsed was on 60 mg of Lasix twice daily at home.  Assessment/Plan: Active Problems:   Acute respiratory failure with hypoxia due to:   A)  Pulmonary edema   B)  Volume overload -third admission in 1 month with volume overload -diuresing well with very high dose Lasix w/ Zaroxolyn  - negative 3.5L since admission -for HD catheter placement and then start HD today     Type II or unspecified type diabetes mellitus with unspecified complication, uncontrolled -cont SSI and follow -FU HgA1c    CKD (chronic kidney disease) stage 4, GFR 15-29 ml/min - with recurrent volume overload, Renal consulted - pt has fistula in right forearm from  end of April -HD cathter for today to start HD    Leukocytosis -no fever or obvious source seen, likely reactive    Morbid obesity   HTN -cont Norvasc, Imdur and Lopressor   DVT prophylaxis: Subcutaneous heparin Code Status: Full Family Communication: Patient and wife at bedside Disposition Plan: home when improved Isolation: Contact isolation for MRSA positive PCR status  Consultants: Nephrology  Procedures: None  Antibiotics: None  HPI/Subjective: Breathing better than yesterday   Objective: Blood pressure 125/51, pulse 65, temperature 97.6 F (36.4 C), temperature source Oral, resp. rate 18, height 5\' 7"  (1.702 m), weight 158.7 kg (349 lb 13.9 oz), SpO2 97.00%.  Intake/Output Summary (Last 24 hours) at 03/13/13 0848 Last data filed at 03/12/13 2134  Gross per 24 hour  Intake    150 ml  Output   3400 ml  Net  -3250 ml     Exam: General: No acute respiratory distress Lungs: wet crackles in both lower lungs Cardiovascular: Regular rate and rhythm without murmur gallop or rub normal S1 and S2,  Abdomen: Nontender, nondistended, soft, bowel sounds positive, no rebound, no ascites, no appreciable mass Musculoskeletal: No significant cyanosis, clubbing of bilateral lower extremities 1-2 plus edema b/l Neurological: Alert and oriented x 3, moves all extremities x 4 without focal neurological deficits, CN 2-12 intact  Data Reviewed: Basic Metabolic Panel:  Recent Labs Lab 03/09/13 1939 03/10/13 0630 03/11/13 0440 03/12/13 1400 03/13/13 0655  NA 136 135 134* 136 136  K 4.9 4.7 3.8 3.4* 3.9  CL 91* 89* 88* 87* 92*  CO2 33* 32 34* 36* 34*  GLUCOSE 124*  144* 115* 126* 144*  BUN 71* 73* 73* 75* 55*  CREATININE 3.46* 3.54* 3.52* 3.22* 2.85*  CALCIUM 8.9 8.9 8.6 9.2 9.0  PHOS  --   --   --  4.7*  --    Liver Function Tests:  Recent Labs Lab 03/12/13 1400  ALBUMIN 3.2*   No results found for this basename: LIPASE, AMYLASE,  in the last 168 hours No  results found for this basename: AMMONIA,  in the last 168 hours CBC:  Recent Labs Lab 03/09/13 1939  WBC 13.0*  HGB 10.0*  HCT 32.0*  MCV 85.8  PLT 228   Cardiac Enzymes: No results found for this basename: CKTOTAL, CKMB, CKMBINDEX, TROPONINI,  in the last 168 hours BNP (last 3 results)  Recent Labs  03/02/13 2031  PROBNP 12262.0*   CBG:  Recent Labs Lab 03/12/13 1227 03/12/13 1358 03/12/13 1807 03/12/13 2133 03/13/13 0801  GLUCAP 118* 118* 127* 212* 136*    No results found for this or any previous visit (from the past 240 hour(s)).   Studies:  Recent x-ray studies have been reviewed in detail by the Attending Physician  Scheduled Meds:  Sheala Dosh,MD [phone]  03/13/2013, 8:48 AM

## 2013-03-14 DIAGNOSIS — N186 End stage renal disease: Secondary | ICD-10-CM

## 2013-03-14 LAB — CBC
HCT: 33.4 % — ABNORMAL LOW (ref 39.0–52.0)
RBC: 3.79 MIL/uL — ABNORMAL LOW (ref 4.22–5.81)
RDW: 18.1 % — ABNORMAL HIGH (ref 11.5–15.5)
WBC: 12.9 10*3/uL — ABNORMAL HIGH (ref 4.0–10.5)

## 2013-03-14 LAB — GLUCOSE, CAPILLARY
Glucose-Capillary: 138 mg/dL — ABNORMAL HIGH (ref 70–99)
Glucose-Capillary: 155 mg/dL — ABNORMAL HIGH (ref 70–99)
Glucose-Capillary: 175 mg/dL — ABNORMAL HIGH (ref 70–99)

## 2013-03-14 LAB — RENAL FUNCTION PANEL
Albumin: 3.3 g/dL — ABNORMAL LOW (ref 3.5–5.2)
BUN: 42 mg/dL — ABNORMAL HIGH (ref 6–23)
Chloride: 94 mEq/L — ABNORMAL LOW (ref 96–112)
GFR calc non Af Amer: 25 mL/min — ABNORMAL LOW (ref >90)
Potassium: 3.9 mEq/L (ref 3.5–5.1)

## 2013-03-14 NOTE — Procedures (Signed)
Pt seen on HD.  Ap 220.  Vp 190.  Breathing much better.  Edema much improved.  He will cont with hs PO lasix thru Tuesday.  DC zaroxalyn.  FU Vonzella Nipple tues for HD.

## 2013-03-14 NOTE — Discharge Summary (Signed)
Physician Discharge Summary  Timothy Banks WUJ:[id number] DOB: Sep 06, 1956 DOA: 03/09/2013  PCP: Luanna Salk, MD  Admit date: 03/09/2013 Discharge date: 03/14/2013  Time spent: 50 minutes  Recommendations for Outpatient Follow-up:  1. PCP in 1 week 2. [facility] to sign papers on Monday and start Dialysis Tuesday  Discharge Diagnoses:    Acute respiratory failure with hypoxia   ESRD newly started on dialysis   Volume Overload   Type II or unspecified type diabetes mellitus with unspecified complication, uncontrolled   CKD (chronic kidney disease) stage 4, GFR 15-29 ml/min   Pulmonary edema   Morbid obesity   Leukocytosis   Discharge Condition:stable  Diet recommendation: Renal, diabetic  Filed Weights   03/13/13 2310 03/14/13 0645 03/14/13 1023  Weight: 151.8 kg (334 lb 10.5 oz) 152 kg (335 lb 1.6 oz) 147.9 kg (326 lb 1 oz)    History of present illness:  Timothy Banks is a 57 y.o. male who presents with acute on chronic SOB. He has recently been admitted to the hospital on 4/11 and again on 4/15 for the same symptoms determined to be due to fluid retention secondary to CKD stage 4-5. He had a fistula placed in his RUE on 4/23 which is in the process of maturing so the patient can begin dialysis to treat his renal failure. He DOES have a history of heart disease including CHF, CAD with prior MI. He also has a history of DM2.  In the ED today patient was again found to be fluid overloaded with frank pulmonary edema on CXR.   Hospital Course:  57 year old male patient with known evolving chronic kidney disease. Now stage V. Has AV fistula recently placed for anticipated initiation of hemodialysis likely within the next few months. He has been admitted to the hospital multiple times in the month of April: Initially on 4/11 and again on 4/15 and finally on 4/28. He has chronic dyspnea which becomes worse and when he becomes volume overloaded. Patient endorses has used  BiPAP in the past at night which has helped with his symptoms. He was transferred from [facility] because of anticipation of potential initiation of dialysis this admission but per the admitting physician's notes no one from the [facility] contacted the nephrologist to determine if they would be initiating dialysis during this admission. His initial chest x-ray was consistent with volume overload. Patient endorsed was on 60 mg of Lasix twice daily at home.   PROCEDURE: Ultrasound guided placement of right IJ Diatek catheter by Dr.DIckson 5/8   Assessment/Plan:  Active Problems:  Acute respiratory failure with hypoxia due to:  Pulmonary edema/ Volume overload  -third admission in 1 month with volume overload  -given inability to manage volume with diuretics, hence seen by Nephrology Dr.Mattingly in consultation and started Hemodialysis on 5/8  -s/p HD catheter placement on 5/8, was dialyzed Thursday, Friday and Saturday   -outpatient HD set up to start on Tuesday at [facility]  Type II or unspecified type diabetes mellitus with unspecified complication, uncontrolled  -cont SSI and follow  -HbA1c 5.8   CKD (chronic kidney disease) stage 4, GFR 15-29 ml/min  - now ESRD started on HD - pt has fistula in right forearm from end of April   Leukocytosis  -no fever or obvious source seen, likely reactive   Morbid obesity   HTN  -cont Norvasc, Imdur and Lopressor      Consultations:  Renal  Discharge Exam: Filed Vitals:   03/14/13  0930 03/14/13 1001 03/14/13 1023 03/14/13 1054  BP: 123/55 110/62 122/47 152/67  Pulse: 78 82 82 83  Temp:   98 F (36.7 C) 98.5 F (36.9 C)  TempSrc:   Oral Oral  Resp:   18 18  Height:      Weight:   147.9 kg (326 lb 1 oz)   SpO2:   97% 100%    General: AAOx3 Cardiovascular: S1S2/RRR Respiratory: CTAB  Discharge Instructions  Discharge Orders   Future Appointments Provider Department Dept Phone   03/26/2013 1:15 PM  Sherren Kerns, MD [facility] [phone]   Future Orders Complete By Expires     Call MD for:  redness, tenderness, or signs of infection (pain, swelling, bleeding, redness, odor or green/yellow discharge around incision site)  As directed     Call MD for:  severe or increased pain, loss or decreased feeling  in affected limb(s)  As directed     Call MD for:  temperature >100.5  As directed     Discharge instructions  As directed     Comments:      Renal Diet    Increase activity slowly  As directed     Resume previous diet  As directed         Medication List    TAKE these medications       albuterol 108 (90 BASE) MCG/ACT inhaler  Commonly known as:  PROVENTIL HFA;VENTOLIN HFA  Inhale 2 puffs into the lungs every 6 (six) hours as needed for wheezing or shortness of breath.     ALPRAZolam 0.5 MG tablet  Commonly known as:  XANAX  Take 0.5 mg by mouth at bedtime.     amLODipine 5 MG tablet  Commonly known as:  NORVASC  Take 1 tablet (5 mg total) by mouth at bedtime.     furosemide 80 MG tablet  Commonly known as:  LASIX  Take 2 tablets (160 mg total) by mouth 2 (two) times daily.     isosorbide mononitrate 30 MG 24 hr tablet  Commonly known as:  IMDUR  Take 30 mg by mouth daily.     metoprolol 50 MG tablet  Commonly known as:  LOPRESSOR  Take 1 tablet (50 mg total) by mouth 2 (two) times daily.     nitroGLYCERIN 0.4 MG SL tablet  Commonly known as:  NITROSTAT  Place 0.4 mg under the tongue every 5 (five) minutes as needed for chest pain.     oxyCODONE-acetaminophen 5-325 MG per tablet  Commonly known as:  ROXICET  Take 1-2 tablets by mouth every 4 (four) hours as needed for pain.     oxyCODONE-acetaminophen 10-325 MG per tablet  Commonly known as:  PERCOCET  Take 1 tablet by mouth every 8 (eight) hours as needed for pain.     potassium chloride SA 20 MEQ tablet  Commonly known as:  K-DUR,KLOR-CON  Take 2 tablets (40 mEq total)  by mouth daily.     simvastatin 20 MG tablet  Commonly known as:  ZOCOR  Take 20 mg by mouth every evening.       No Known Allergies     Follow-up Information   Follow up with SUFFRONT,WILFORD, MD In 1 week.      Follow up with [facility]. (Monday to sign papers and start Dialysis on Tuesday)        The results of significant diagnostics from this hospitalization (including imaging, microbiology, ancillary and laboratory)  are listed below for reference.    Significant Diagnostic Studies: Dg Chest 2 View  03/02/2013  *RADIOLOGY REPORT*  Clinical Data: Shortness of breath.  Fluid on lungs.  CHEST - 2 VIEW  Comparison: 02/19/2013.  Findings: The right IJ central venous catheter has been removed. There is stable cardiomegaly status post median sternotomy and CABG.  Interstitial edema has improved, although there is evidence of small pleural effusions bilaterally.  There is no confluent airspace opacity or pneumothorax.  IMPRESSION: Cardiomegaly with interstitial pulmonary edema and small bilateral pleural effusions consistent with mild congestive heart failure.   Original Report Authenticated By: Carey Bullocks, M.D.    US Renal Port  02/17/2013  *RADIOLOGY REPORT*  Clinical Data: 57 year old male with acute renal failure.  RENAL/URINARY TRACT ULTRASOUND COMPLETE  Comparison:  12/08/2012. CT abdomen and pelvis 07/18/2011.  Findings:  Right Kidney:  No hydronephrosis.  Renal length approximately 10.0 cm.  Cortical echotexture at the upper limits of normal.  Left Kidney:  Difficult to visualize.  No hydronephrosis.  Stable renal cortical thickness.  Echotexture appears mildly increased.  Bladder:  Not visible.  IMPRESSION: No hydronephrosis.  Ventilation in the left kidney limited by large body habitus. Consider chronic medical renal disease.   Original Report Authenticated By: Erskine Speed, M.D.    Dg Chest Port 1 View  03/12/2013  *RADIOLOGY REPORT*  Clinical Data: Status post  dialysis catheter placement.  PORTABLE CHEST - 1 VIEW  Comparison: Single view of the chest 03/11/2013.  Findings: Double lumen right IJ approach dialysis catheter is in place.  Tip of the proximal lumen is in the superior vena cava with the distal lumen projecting at the superior cavoatrial junction. There is no pneumothorax.  Cardiomegaly and pulmonary edema are noted.  IMPRESSION:  1.  Dialysis catheter in place without evidence of complication. 2.  Pulmonary edema.   Original Report Authenticated By: Holley Dexter, M.D.    Dg Chest Port 1 View  03/11/2013  *RADIOLOGY REPORT*  Clinical Data: Congestive heart failure.  Shortness of breath.  PORTABLE CHEST - 1 VIEW  Comparison: 05/05 and 03/08/2013  Findings: Persistent cardiomegaly.  Pulmonary vascularity is more prominent than on the prior study.  However, the slight pulmonary edema at the right lung base has resolved.  No appreciable effusions.  IMPRESSION: Increased pulmonary vascular congestion. Resolved edema at the right lung base.   Original Report Authenticated By: Francene Boyers, M.D.    Dg Chest Port 1 View  02/19/2013  *RADIOLOGY REPORT*  Clinical Data:  Pulmonary edema  PORTABLE CHEST - 1 VIEW  Comparison: Portable exam 0608 hours compared to 02/17/2013  Findings: Right jugular central venous catheter with tip projecting over SVC. Enlargement of cardiac silhouette post CABG. Pulmonary vascular congestion. Decreased lung volumes. Accentuation of perihilar markings likely represents mild pulmonary edema. No segmental consolidation, pleural effusion, or pneumothorax.  IMPRESSION: Probable mild CHF.   Original Report Authenticated By: Ulyses Southward, M.D.    Dg Chest Port 1 View  02/17/2013  *RADIOLOGY REPORT*  Clinical Data: New right internal jugular hemodialysis catheter  PORTABLE CHEST - 1 VIEW  Comparison: Portable exam 1259 hours compared to 02/17/2013  Findings: New right IJ catheter tip projecting over SVC at the level of the aortic arch.  Enlargement of cardiac silhouette post CABG. Pulmonary vascular congestion. Perihilar infiltrate likely mild edema. No gross pleural effusion or pneumothorax.  IMPRESSION: No pneumothorax following right jugular line placement. Probable mild pulmonary edema.   Original Report Authenticated By: Ulyses Southward,  M.D.    Dg Fluoro Guide Cv Line-no Report  03/12/2013  CLINICAL DATA: Dialysis catheter insertion   FLOURO GUIDE CV LINE  Fluoroscopy was utilized by the requesting physician.  No radiographic  interpretation.      Microbiology: No results found for this or any previous visit (from the past 240 hour(s)).   Labs: Basic Metabolic Panel:  Recent Labs Lab 03/10/13 0630 03/11/13 0440 03/12/13 1400 03/13/13 0655 03/14/13 0700  NA 135 134* 136 136 137  K 4.7 3.8 3.4* 3.9 3.9  CL 89* 88* 87* 92* 94*  CO2 32 34* 36* 34* 33*  GLUCOSE 144* 115* 126* 144* 175*  BUN 73* 73* 75* 55* 42*  CREATININE 3.54* 3.52* 3.22* 2.85* 2.69*  CALCIUM 8.9 8.6 9.2 9.0 8.6  PHOS  --   --  4.7*  --  4.1   Liver Function Tests:  Recent Labs Lab 03/12/13 1400 03/14/13 0700  ALBUMIN 3.2* 3.3*   No results found for this basename: LIPASE, AMYLASE,  in the last 168 hours No results found for this basename: AMMONIA,  in the last 168 hours CBC:  Recent Labs Lab 03/09/13 1939 03/14/13 0700  WBC 13.0* 12.9*  HGB 10.0* 10.2*  HCT 32.0* 33.4*  MCV 85.8 88.1  PLT 228 223   Cardiac Enzymes: No results found for this basename: CKTOTAL, CKMB, CKMBINDEX, TROPONINI,  in the last 168 hours BNP: BNP (last 3 results)  Recent Labs  03/02/13 2031  PROBNP 12262.0*   CBG:  Recent Labs Lab 03/13/13 1150 03/13/13 1653 03/14/13 0002 03/14/13 1053 03/14/13 1236  GLUCAP 241* 161* 138* 155* 175*       Signed:  Jordie Skalsky  [facility] 03/14/2013, 12:54 PM

## 2013-03-26 ENCOUNTER — Ambulatory Visit: Payer: Medicare Other | Admitting: Vascular Surgery

## 2013-04-22 ENCOUNTER — Encounter: Payer: Self-pay | Admitting: Vascular Surgery

## 2013-04-23 ENCOUNTER — Encounter (INDEPENDENT_AMBULATORY_CARE_PROVIDER_SITE_OTHER): Payer: Medicare Other | Admitting: *Deleted

## 2013-04-23 ENCOUNTER — Encounter: Payer: Self-pay | Admitting: Vascular Surgery

## 2013-04-23 ENCOUNTER — Ambulatory Visit (INDEPENDENT_AMBULATORY_CARE_PROVIDER_SITE_OTHER): Payer: Medicare Other | Admitting: Vascular Surgery

## 2013-04-23 ENCOUNTER — Other Ambulatory Visit: Payer: Self-pay

## 2013-04-23 VITALS — BP 139/52 | HR 70 | Ht 67.0 in | Wt 340.9 lb

## 2013-04-23 DIAGNOSIS — N186 End stage renal disease: Secondary | ICD-10-CM

## 2013-04-23 NOTE — Progress Notes (Signed)
Patient is a 57 year old male who is status post creation of a right radiocephalic AV fistula on 02/25/2013. He presents today for further followup. He is currently dialyzing via a right-sided catheter on Tuesday Thursday Saturday. He reports that digits 3 4 and 5 on the right hand have numbness tingling and aching which is fairly continuous. He states that this has not improved since his operation. It began to get worse approximately 2 weeks after his operation.   Past Medical History  Diagnosis Date  . Myocardial infarction   . Diabetes mellitus without complication   . CHF (congestive heart failure)   . Asthma   . Hyperlipidemia   . Hypertension   . Chronic kidney disease 02/17/2013    ACUTE RENAL    Physical exam:  Filed Vitals:   04/23/13 0846  BP: 139/52  Pulse: 70  Height: 5\' 7"  (1.702 m)  Weight: 340 lb 14.4 oz (154.631 kg)  SpO2: 94%   Right upper extremity: Well-healed radiocephalic fistula. Palpable thrill in fistula. The fistula is not easily palpable in the forearm. Left upper extremity: Well-healed scar from previous accident no prior access procedures  Data: The patient had a duplex of his AV fistula today. This showed some possible anastomotic narrowing. In addition the fistula still quite small proximally 4 mm in diameter. There was also abnormal flow in the proximal radial artery.  Assessment: Most likely has some steal component from his radiocephalic fistula. The time course of his symptoms certainly seemed to be related to his fistula. The fistula is not really maturing  Plan: Would do best option at this point would be to ligate his right radiocephalic fistula and consider placement of a new hemodialysis access in the left arm in a few weeks. I discussed with the patient today that the numbness and tingling in his right hand should improve after fistula ligation but we would have to wait and see it after the procedure. Hopefully improving flow to his hand will  improve his symptoms.  The procedure is scheduled for Monday, June 23 with my partner Dr. Imogene Burn.  Fabienne Bruns, MD [facility] [place] Office: [phone] Pager: [phone]

## 2013-04-24 NOTE — Progress Notes (Signed)
04/24/13 0944  OBSTRUCTIVE SLEEP APNEA  Have you ever been diagnosed with sleep apnea through a sleep study? No  Do you snore loudly (loud enough to be heard through closed doors)?  0  Do you often feel tired, fatigued, or sleepy during the daytime? 0  Has anyone observed you stop breathing during your sleep? 0  Do you have, or are you being treated for high blood pressure? 1  BMI more than 35 kg/m2? 1  Age over 57 years old? 1  Neck circumference greater than 40 cm/18 inches? 1  Gender: 1  Obstructive Sleep Apnea Score 5  Score 4 or greater  Results sent to PCP

## 2013-04-26 MED ORDER — DEXTROSE 5 % IV SOLN
1.5000 g | INTRAVENOUS | Status: AC
  Administered 2013-04-27: 1.5 g via INTRAVENOUS
  Filled 2013-04-26: qty 1.5

## 2013-04-27 ENCOUNTER — Telehealth: Payer: Self-pay | Admitting: Vascular Surgery

## 2013-04-27 ENCOUNTER — Encounter (HOSPITAL_COMMUNITY): Payer: Self-pay | Admitting: Anesthesiology

## 2013-04-27 ENCOUNTER — Ambulatory Visit (HOSPITAL_COMMUNITY)
Admission: RE | Admit: 2013-04-27 | Discharge: 2013-04-27 | Disposition: A | Discharge status: Home / Self Care | Payer: Medicare Other | Source: Ambulatory Visit | Attending: Vascular Surgery | Admitting: Vascular Surgery

## 2013-04-27 ENCOUNTER — Ambulatory Visit (HOSPITAL_COMMUNITY): Payer: Medicare Other | Admitting: Anesthesiology

## 2013-04-27 ENCOUNTER — Encounter (HOSPITAL_COMMUNITY)
Admission: RE | Disposition: A | Discharge status: Home / Self Care | Payer: Self-pay | Source: Ambulatory Visit | Attending: Vascular Surgery

## 2013-04-27 DIAGNOSIS — Z6841 Body Mass Index (BMI) 40.0 and over, adult: Secondary | ICD-10-CM | POA: Insufficient documentation

## 2013-04-27 DIAGNOSIS — E119 Type 2 diabetes mellitus without complications: Secondary | ICD-10-CM | POA: Insufficient documentation

## 2013-04-27 DIAGNOSIS — N186 End stage renal disease: Secondary | ICD-10-CM

## 2013-04-27 DIAGNOSIS — J4489 Other specified chronic obstructive pulmonary disease: Secondary | ICD-10-CM | POA: Insufficient documentation

## 2013-04-27 DIAGNOSIS — E785 Hyperlipidemia, unspecified: Secondary | ICD-10-CM | POA: Insufficient documentation

## 2013-04-27 DIAGNOSIS — Z992 Dependence on renal dialysis: Secondary | ICD-10-CM

## 2013-04-27 DIAGNOSIS — I509 Heart failure, unspecified: Secondary | ICD-10-CM | POA: Insufficient documentation

## 2013-04-27 DIAGNOSIS — I120 Hypertensive chronic kidney disease with stage 5 chronic kidney disease or end stage renal disease: Secondary | ICD-10-CM | POA: Insufficient documentation

## 2013-04-27 DIAGNOSIS — T82898A Other specified complication of vascular prosthetic devices, implants and grafts, initial encounter: Secondary | ICD-10-CM | POA: Insufficient documentation

## 2013-04-27 DIAGNOSIS — I2510 Atherosclerotic heart disease of native coronary artery without angina pectoris: Secondary | ICD-10-CM | POA: Insufficient documentation

## 2013-04-27 DIAGNOSIS — J449 Chronic obstructive pulmonary disease, unspecified: Secondary | ICD-10-CM | POA: Insufficient documentation

## 2013-04-27 DIAGNOSIS — Z951 Presence of aortocoronary bypass graft: Secondary | ICD-10-CM | POA: Insufficient documentation

## 2013-04-27 DIAGNOSIS — I252 Old myocardial infarction: Secondary | ICD-10-CM | POA: Insufficient documentation

## 2013-04-27 DIAGNOSIS — Z87891 Personal history of nicotine dependence: Secondary | ICD-10-CM | POA: Insufficient documentation

## 2013-04-27 DIAGNOSIS — Y832 Surgical operation with anastomosis, bypass or graft as the cause of abnormal reaction of the patient, or of later complication, without mention of misadventure at the time of the procedure: Secondary | ICD-10-CM | POA: Insufficient documentation

## 2013-04-27 HISTORY — PX: LIGATION OF ARTERIOVENOUS  FISTULA: SHX5948

## 2013-04-27 LAB — POCT I-STAT 4, (NA,K, GLUC, HGB,HCT): Hemoglobin: 13.9 g/dL (ref 13.0–17.0)

## 2013-04-27 SURGERY — LIGATION OF ARTERIOVENOUS  FISTULA
Anesthesia: Monitor Anesthesia Care | Site: Arm Lower | Laterality: Right | Wound class: Clean

## 2013-04-27 MED ORDER — LIDOCAINE-EPINEPHRINE (PF) 1 %-1:200000 IJ SOLN
INTRAMUSCULAR | Status: DC | PRN
  Administered 2013-04-27: 20 mL

## 2013-04-27 MED ORDER — OXYCODONE HCL 5 MG/5ML PO SOLN: 5.0000 mg | Freq: Once | ORAL | Status: DC | PRN

## 2013-04-27 MED ORDER — FENTANYL CITRATE 0.05 MG/ML IJ SOLN: 50.0000 ug | Freq: Once | INTRAMUSCULAR | Status: DC

## 2013-04-27 MED ORDER — OXYCODONE HCL 5 MG PO TABS: 5.0000 mg | ORAL_TABLET | ORAL | Status: DC | PRN

## 2013-04-27 MED ORDER — OXYCODONE HCL 5 MG PO TABS: 5.0000 mg | ORAL_TABLET | Freq: Once | ORAL | Status: DC | PRN

## 2013-04-27 MED ORDER — SODIUM CHLORIDE 0.9 % IV SOLN: INTRAVENOUS | Status: DC

## 2013-04-27 MED ORDER — FENTANYL CITRATE 0.05 MG/ML IJ SOLN
INTRAMUSCULAR | Status: DC | PRN
  Administered 2013-04-27: 50 ug via INTRAVENOUS

## 2013-04-27 MED ORDER — FENTANYL CITRATE 0.05 MG/ML IJ SOLN: 25.0000 ug | INTRAMUSCULAR | Status: DC | PRN

## 2013-04-27 MED ORDER — MIDAZOLAM HCL 5 MG/5ML IJ SOLN
INTRAMUSCULAR | Status: DC | PRN
  Administered 2013-04-27: 2 mg via INTRAVENOUS

## 2013-04-27 MED ORDER — 0.9 % SODIUM CHLORIDE (POUR BTL) OPTIME
TOPICAL | Status: DC | PRN
  Administered 2013-04-27: 1000 mL

## 2013-04-27 MED ORDER — MIDAZOLAM HCL 2 MG/2ML IJ SOLN: 1.0000 mg | INTRAMUSCULAR | Status: DC | PRN

## 2013-04-27 MED ORDER — PROPOFOL 10 MG/ML IV BOLUS
INTRAVENOUS | Status: DC | PRN
  Administered 2013-04-27 (×5): 20 mg via INTRAVENOUS

## 2013-04-27 MED ORDER — THROMBIN 20000 UNITS EX SOLR
CUTANEOUS | Status: AC
  Filled 2013-04-27: qty 20000

## 2013-04-27 MED ORDER — LIDOCAINE HCL (CARDIAC) 20 MG/ML IV SOLN
INTRAVENOUS | Status: DC | PRN
  Administered 2013-04-27: 20 mg via INTRAVENOUS

## 2013-04-27 MED ORDER — MUPIROCIN 2 % EX OINT
TOPICAL_OINTMENT | Freq: Two times a day (BID) | CUTANEOUS | Status: DC
  Filled 2013-04-27: qty 22

## 2013-04-27 MED ORDER — MUPIROCIN 2 % EX OINT
TOPICAL_OINTMENT | CUTANEOUS | Status: AC
  Administered 2013-04-27: 1 via NASAL
  Filled 2013-04-27: qty 22

## 2013-04-27 MED ORDER — SODIUM CHLORIDE 0.9 % IV SOLN
INTRAVENOUS | Status: DC | PRN
  Administered 2013-04-27: 07:00:00 via INTRAVENOUS

## 2013-04-27 SURGICAL SUPPLY — 32 items
ADH SKN CLS APL DERMABOND .7 (GAUZE/BANDAGES/DRESSINGS) ×1
CANISTER SUCTION 2500CC (MISCELLANEOUS) ×2 IMPLANT
CLOTH BEACON ORANGE TIMEOUT ST (SAFETY) ×2 IMPLANT
COVER SURGICAL LIGHT HANDLE (MISCELLANEOUS) ×2 IMPLANT
DERMABOND ADVANCED (GAUZE/BANDAGES/DRESSINGS) ×1
DERMABOND ADVANCED .7 DNX12 (GAUZE/BANDAGES/DRESSINGS) ×1 IMPLANT
ELECT REM PT RETURN 9FT ADLT (ELECTROSURGICAL) ×2
ELECTRODE REM PT RTRN 9FT ADLT (ELECTROSURGICAL) ×1 IMPLANT
GEL ULTRASOUND 20GR AQUASONIC (MISCELLANEOUS) IMPLANT
GLOVE BIO SURGEON STRL SZ7 (GLOVE) ×2 IMPLANT
GLOVE BIOGEL PI IND STRL 7.5 (GLOVE) ×1 IMPLANT
GLOVE BIOGEL PI INDICATOR 7.5 (GLOVE) ×1
GOWN STRL NON-REIN LRG LVL3 (GOWN DISPOSABLE) ×4 IMPLANT
KIT BASIN OR (CUSTOM PROCEDURE TRAY) ×2 IMPLANT
KIT ROOM TURNOVER OR (KITS) ×2 IMPLANT
NS IRRIG 1000ML POUR BTL (IV SOLUTION) ×2 IMPLANT
PACK CV ACCESS (CUSTOM PROCEDURE TRAY) ×2 IMPLANT
PAD ARMBOARD 7.5X6 YLW CONV (MISCELLANEOUS) ×4 IMPLANT
SPONGE GAUZE 4X4 12PLY (GAUZE/BANDAGES/DRESSINGS) ×2 IMPLANT
SPONGE SURGIFOAM ABS GEL 100 (HEMOSTASIS) IMPLANT
SUT ETHILON 3 0 PS 1 (SUTURE) IMPLANT
SUT MNCRL AB 4-0 PS2 18 (SUTURE) ×4 IMPLANT
SUT PROLENE 6 0 BV (SUTURE) IMPLANT
SUT SILK 0 TIES 10X30 (SUTURE) ×2 IMPLANT
SUT VIC AB 3-0 SH 27 (SUTURE) ×2
SUT VIC AB 3-0 SH 27X BRD (SUTURE) ×1 IMPLANT
SWAB COLLECTION DEVICE MRSA (MISCELLANEOUS) IMPLANT
TOWEL OR 17X24 6PK STRL BLUE (TOWEL DISPOSABLE) ×2 IMPLANT
TOWEL OR 17X26 10 PK STRL BLUE (TOWEL DISPOSABLE) ×2 IMPLANT
TUBE ANAEROBIC SPECIMEN COL (MISCELLANEOUS) IMPLANT
UNDERPAD 30X30 INCONTINENT (UNDERPADS AND DIAPERS) ×2 IMPLANT
WATER STERILE IRR 1000ML POUR (IV SOLUTION) ×2 IMPLANT

## 2013-04-27 NOTE — Preoperative (Signed)
Beta Blockers   Reason not to administer Beta Blockers:received lopressor today

## 2013-04-27 NOTE — Anesthesia Procedure Notes (Signed)
Procedure Name: MAC Date/Time: 04/27/2013 7:48 AM Performed by: Marena Chancy Pre-anesthesia Checklist: Patient identified, Timeout performed, Emergency Drugs available, Suction available and Patient being monitored Patient Re-evaluated:Patient Re-evaluated prior to inductionOxygen Delivery Method: Simple face mask Placement Confirmation: positive ETCO2

## 2013-04-27 NOTE — H&P (View-Only) (Signed)
Patient is a 56-year-old male who is status post creation of a right radiocephalic AV fistula on 02/25/2013. He presents today for further followup. He is currently dialyzing via a right-sided catheter on Tuesday Thursday Saturday. He reports that digits 3 4 and 5 on the right hand have numbness tingling and aching which is fairly continuous. He states that this has not improved since his operation. It began to get worse approximately 2 weeks after his operation.   Past Medical History  Diagnosis Date  . Myocardial infarction   . Diabetes mellitus without complication   . CHF (congestive heart failure)   . Asthma   . Hyperlipidemia   . Hypertension   . Chronic kidney disease 02/17/2013    ACUTE RENAL    Physical exam:  Filed Vitals:   04/23/13 0846  BP: 139/52  Pulse: 70  Height: 5' 7" (1.702 m)  Weight: 340 lb 14.4 oz (154.631 kg)  SpO2: 94%   Right upper extremity: Well-healed radiocephalic fistula. Palpable thrill in fistula. The fistula is not easily palpable in the forearm. Left upper extremity: Well-healed scar from previous accident no prior access procedures  Data: The patient had a duplex of his AV fistula today. This showed some possible anastomotic narrowing. In addition the fistula still quite small proximally 4 mm in diameter. There was also abnormal flow in the proximal radial artery.  Assessment: Most likely has some steal component from his radiocephalic fistula. The time course of his symptoms certainly seemed to be related to his fistula. The fistula is not really maturing  Plan: Would do best option at this point would be to ligate his right radiocephalic fistula and consider placement of a new hemodialysis access in the left arm in a few weeks. I discussed with the patient today that the numbness and tingling in his right hand should improve after fistula ligation but we would have to wait and see it after the procedure. Hopefully improving flow to his hand will  improve his symptoms.  The procedure is scheduled for Monday, June 23 with my partner Dr. Chen.  Cadden Elizondo, MD [facility] [place] Office: [phone] Pager: [phone]  

## 2013-04-27 NOTE — Anesthesia Preprocedure Evaluation (Signed)
Anesthesia Evaluation  Patient identified by MRN, date of birth, ID band Patient awake    Reviewed: Allergy & Precautions, H&P , NPO status , Patient's Chart, lab work & pertinent test results  Airway Mallampati: II TM Distance: <3 FB Neck ROM: full    Dental   Pulmonary asthma , COPDCurrent Smoker, former smoker,  + rhonchi         Cardiovascular hypertension, + CAD, + Past MI, + CABG and +CHF Rhythm:Regular Rate:Normal     Neuro/Psych  Neuromuscular disease    GI/Hepatic   Endo/Other  diabetes, Type obesity  Renal/GU ESRFRenal disease     Musculoskeletal   Abdominal   Peds  Hematology   Anesthesia Other Findings   Reproductive/Obstetrics                           Anesthesia Physical Anesthesia Plan  ASA: III  Anesthesia Plan: MAC   Post-op Pain Management:    Induction: Intravenous  Airway Management Planned: Simple Face Mask  Additional Equipment:   Intra-op Plan:   Post-operative Plan:   Informed Consent: I have reviewed the patients History and Physical, chart, labs and discussed the procedure including the risks, benefits and alternatives for the proposed anesthesia with the patient or authorized representative who has indicated his/her understanding and acceptance.     Plan Discussed with: CRNA and Surgeon  Anesthesia Plan Comments:         Anesthesia Quick Evaluation

## 2013-04-27 NOTE — Telephone Encounter (Signed)
lvm re appt info, sent letter - kf °

## 2013-04-27 NOTE — Anesthesia Postprocedure Evaluation (Signed)
  Anesthesia Post-op Note  Patient: Timothy Banks  Procedure(s) Performed: Procedure(s): LIGATION OF RIGHT RADIAL CEPHALIC ARTERIOVENOUS  FISTULA (Right)  Patient Location: PACU  Anesthesia Type:MAC  Level of Consciousness: awake and alert   Airway and Oxygen Therapy: Patient Spontanous Breathing  Post-op Pain: none  Post-op Assessment: Post-op Vital signs reviewed, Patient's Cardiovascular Status Stable, Respiratory Function Stable, Patent Airway, No signs of Nausea or vomiting and Pain level controlled  Post-op Vital Signs: stable  Complications: No apparent anesthesia complications

## 2013-04-27 NOTE — Transfer of Care (Signed)
Immediate Anesthesia Transfer of Care Note  Patient: Timothy Banks  Procedure(s) Performed: Procedure(s): LIGATION OF RIGHT RADIAL CEPHALIC ARTERIOVENOUS  FISTULA (Right)  Patient Location: PACU  Anesthesia Type:MAC  Level of Consciousness: awake, alert  and oriented  Airway & Oxygen Therapy: Patient Spontanous Breathing and Patient connected to nasal cannula oxygen  Post-op Assessment: Report given to PACU RN and Post -op Vital signs reviewed and stable  Post vital signs: Reviewed and stable  Complications: No apparent anesthesia complications

## 2013-04-27 NOTE — Interval H&P Note (Signed)
[facility] [place]  History and Physical Update  The patient was interviewed and re-examined.  The patient's previous History and Physical has been reviewed and is unchanged from Dr. Evelina Dun consult on: 04/23/13.  There is no change in the plan of care: Ligation of R AVF.  Leonides Sake, MD [facility] [place] Office: [phone] Pager: [phone]  04/27/2013, 7:10 AM

## 2013-04-27 NOTE — Op Note (Signed)
OPERATIVE NOTE   PROCEDURE: Ligation of right radiocephalic arteriovenous fistula   PRE-OPERATIVE DIAGNOSIS: Right hand steal syndrome  POST-OPERATIVE DIAGNOSIS: same as above   SURGEON: Leonides Sake, MD  ANESTHESIA: local and MAC  ESTIMATED BLOOD LOSS: 30 cc  FINDING(S): 1. Improved biphasic radial signal after ligation  SPECIMEN(S):  none  INDICATIONS:   Timothy Banks is a 57 y.o. male who presents with right hand steal syndrome.  Dr. Darrick Penna felt patient would benefit from ligation of the right radiocephalic arteriovenous fistula.  Risk, benefits, and alternatives to access surgery were discussed.  The patient is aware the risks include but are not limited to: bleeding, infection, nerve damage, need for additional procedures, death and stroke.  The patient agrees to proceed forward with the procedure.  DESCRIPTION: After obtaining full informed written consent, the patient was brought back to the operating room and placed supine upon the operating table.  The patient received IV antibiotics prior to induction.  After obtaining adequate anesthesia, the patient was prepped and draped in the standard fashion for: right arm access procedure.  I injected 8 cc of 1% lidocaine with epinephrine over the previous incision.  I made an incision over the previous incision.  I dissected out the fistula.  I clamped the fistula and tested the radial artery.  The radial signal improved to a biphasic signal from a previous monophasic signal.  I placed two 2-0 ties and then transected the fistula.  Bleeding was controlled with electrocautery.  The subcutaneous tissue was reapproximated with a running 3-0 Vicryl.  The skin was reapproximated with a 4-0 monocryl subcuticular stitch.  The skin was cleaned, dried, and reinforced with Dermabond.    COMPLICATIONS: none  CONDITION: stable  Leonides Sake, MD [facility] Office: [phone] Pager: [phone]  04/27/2013, 8:29  AM

## 2013-04-27 NOTE — Telephone Encounter (Signed)
Message copied by Margaretmary Eddy on Mon Apr 27, 2013  2:43 PM ------      Message from: Melene Plan      Created: Mon Apr 27, 2013  9:51 AM                   ----- Message -----         From: Fransisco Hertz, MD         Sent: 04/27/2013   8:33 AM           To: Reuel Derby, Melene Plan, RN            EUAN WANDLER      [id number]      Mar 21, 1956                  PROCEDURE:      Ligation of right radiocephalic arteriovenous fistula             Follow-up: 4-6 weeks       ------

## 2013-04-29 ENCOUNTER — Encounter (HOSPITAL_COMMUNITY): Payer: Self-pay | Admitting: Vascular Surgery

## 2013-06-04 ENCOUNTER — Encounter: Payer: Self-pay | Admitting: Vascular Surgery

## 2013-06-05 ENCOUNTER — Ambulatory Visit (INDEPENDENT_AMBULATORY_CARE_PROVIDER_SITE_OTHER): Payer: Medicare Other | Admitting: Vascular Surgery

## 2013-06-05 ENCOUNTER — Encounter: Payer: Self-pay | Admitting: Vascular Surgery

## 2013-06-05 VITALS — BP 142/72 | HR 72 | Resp 16 | Ht 67.0 in | Wt 333.0 lb

## 2013-06-05 DIAGNOSIS — Z01810 Encounter for preprocedural cardiovascular examination: Secondary | ICD-10-CM

## 2013-06-05 DIAGNOSIS — R200 Anesthesia of skin: Secondary | ICD-10-CM

## 2013-06-05 DIAGNOSIS — G458 Other transient cerebral ischemic attacks and related syndromes: Secondary | ICD-10-CM | POA: Insufficient documentation

## 2013-06-05 DIAGNOSIS — N186 End stage renal disease: Secondary | ICD-10-CM

## 2013-06-05 DIAGNOSIS — R209 Unspecified disturbances of skin sensation: Secondary | ICD-10-CM

## 2013-06-05 DIAGNOSIS — T82898D Other specified complication of vascular prosthetic devices, implants and grafts, subsequent encounter: Secondary | ICD-10-CM

## 2013-06-05 DIAGNOSIS — Z5189 Encounter for other specified aftercare: Secondary | ICD-10-CM

## 2013-06-05 NOTE — Progress Notes (Signed)
[facility] [place]  Postoperative Access Visit  History of Present Illness  Timothy Banks is a 57 y.o. year old male who presents for postoperative follow-up for: Ligation of R RC AVF (Date: 04/27/12).  The R RC AVF was ligated due to progression of steal sx after a R RC AVF placed by Dr. Darrick Penna on 02/27/13.  The pt also had RIJV TDC placed on 03/12/13.  The patient's wounds are healed.  The patient notes ulnar distribution numbness and weakness in his right hand, non-dominant.  The patient is able to complete their activities of daily living.  The patient's current symptoms are: pain , numbness, and weakness in R hand.  The notes the flow rates on the RIJV TDC have decreased to the point that they have to run him for 5 hrs rather than 3 hrs.  For VQI Use Only  PRE-ADM LIVING: Home  AMB STATUS: Ambulatory  Physical Examination Filed Vitals:   06/05/13 1513  BP: 142/72  Pulse: 72  Resp: 16    RUE: Incision is healed, skin feels warm, hand grip is 4/5, sensation in digits is grossly decreased in fingers 3-5, non-palpable thrill, no bruit can be auscultated , palpable radial pulse  Medical Decision Making  Timothy Banks is a 57 y.o. 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  Pt has residual sx in the R hand that I am not convinced are truly due to steal syndrome.  Arterial duplex of R arm is ordered.  Additionally, I am referring him to Hand Surgery for further evaluation for possible cubital tunnel syndrome.    Also I have referred him to OT for rehab of his R hand.  Due to poor flow rates in the RIJ TDC, I recommend removal of the RIJ TDC and placement in the LIJV.  This is scheduled for this coming Wednesday 6 AUG 14 with Dr. Arbie Cookey.  After these consults on completed, the patient will follow up with Dr. Darrick Penna.  Thank you for allowing [place] to participate in this patient's care.  Leonides Sake, MD [facility] [place] Office: [phone] Pager: [phone]  06/05/2013, 3:29 PM

## 2013-06-08 ENCOUNTER — Other Ambulatory Visit: Payer: Self-pay

## 2013-06-08 ENCOUNTER — Encounter (HOSPITAL_COMMUNITY): Payer: Self-pay | Admitting: *Deleted

## 2013-06-08 NOTE — Progress Notes (Signed)
No orders in [facility] for surgery on 06/10/13.  Called Dr. Bosie Helper office and spoke with Okey Regal. She states she will get them in for [place].

## 2013-06-08 NOTE — Addendum Note (Signed)
Addended by: Sharee Pimple on: 06/08/2013 09:14 AM   Modules accepted: Orders

## 2013-06-08 NOTE — Addendum Note (Signed)
Addended by: Sharee Pimple on: 06/08/2013 09:19 AM   Modules accepted: Orders

## 2013-06-09 ENCOUNTER — Encounter (HOSPITAL_COMMUNITY): Payer: Self-pay | Admitting: Pharmacy Technician

## 2013-06-09 MED ORDER — DEXTROSE 5 % IV SOLN
1.5000 g | INTRAVENOUS | Status: AC
  Administered 2013-06-10: 1.5 g via INTRAVENOUS
  Filled 2013-06-09: qty 1.5

## 2013-06-10 ENCOUNTER — Ambulatory Visit (HOSPITAL_COMMUNITY): Payer: Medicare Other | Admitting: Anesthesiology

## 2013-06-10 ENCOUNTER — Encounter (HOSPITAL_COMMUNITY)
Admission: RE | Disposition: A | Discharge status: Home / Self Care | Payer: Self-pay | Source: Ambulatory Visit | Attending: Vascular Surgery

## 2013-06-10 ENCOUNTER — Encounter (HOSPITAL_COMMUNITY): Payer: Self-pay | Admitting: *Deleted

## 2013-06-10 ENCOUNTER — Ambulatory Visit (HOSPITAL_COMMUNITY): Payer: Medicare Other

## 2013-06-10 ENCOUNTER — Encounter (HOSPITAL_COMMUNITY): Payer: Self-pay | Admitting: Anesthesiology

## 2013-06-10 ENCOUNTER — Ambulatory Visit (HOSPITAL_COMMUNITY)
Admission: RE | Admit: 2013-06-10 | Discharge: 2013-06-10 | Disposition: A | Discharge status: Home / Self Care | Payer: Medicare Other | Source: Ambulatory Visit | Attending: Vascular Surgery | Admitting: Vascular Surgery

## 2013-06-10 DIAGNOSIS — Y929 Unspecified place or not applicable: Secondary | ICD-10-CM | POA: Insufficient documentation

## 2013-06-10 DIAGNOSIS — T82898A Other specified complication of vascular prosthetic devices, implants and grafts, initial encounter: Secondary | ICD-10-CM | POA: Insufficient documentation

## 2013-06-10 DIAGNOSIS — N186 End stage renal disease: Secondary | ICD-10-CM

## 2013-06-10 DIAGNOSIS — Y841 Kidney dialysis as the cause of abnormal reaction of the patient, or of later complication, without mention of misadventure at the time of the procedure: Secondary | ICD-10-CM | POA: Insufficient documentation

## 2013-06-10 HISTORY — PX: REMOVAL OF A DIALYSIS CATHETER: SHX6053

## 2013-06-10 HISTORY — DX: Myoneural disorder, unspecified: G70.9

## 2013-06-10 HISTORY — DX: Angina pectoris, unspecified: I20.9

## 2013-06-10 HISTORY — PX: INSERTION OF DIALYSIS CATHETER: SHX1324

## 2013-06-10 HISTORY — DX: Atherosclerotic heart disease of native coronary artery without angina pectoris: I25.10

## 2013-06-10 LAB — GLUCOSE, CAPILLARY: Glucose-Capillary: 221 mg/dL — ABNORMAL HIGH (ref 70–99)

## 2013-06-10 LAB — POCT I-STAT 4, (NA,K, GLUC, HGB,HCT): Glucose, Bld: 245 mg/dL — ABNORMAL HIGH (ref 70–99)

## 2013-06-10 SURGERY — REMOVAL, DIALYSIS CATHETER
Anesthesia: Monitor Anesthesia Care | Site: Neck | Laterality: Right

## 2013-06-10 MED ORDER — SODIUM CHLORIDE 0.9 % IV SOLN
INTRAVENOUS | Status: DC | PRN
  Administered 2013-06-10: 11:00:00 via INTRAVENOUS

## 2013-06-10 MED ORDER — FENTANYL CITRATE 0.05 MG/ML IJ SOLN
INTRAMUSCULAR | Status: AC
  Filled 2013-06-10: qty 2

## 2013-06-10 MED ORDER — HEPARIN SODIUM (PORCINE) 1000 UNIT/ML IJ SOLN
INTRAMUSCULAR | Status: AC
  Filled 2013-06-10: qty 1

## 2013-06-10 MED ORDER — OXYCODONE HCL 5 MG PO TABS: 5.0000 mg | ORAL_TABLET | Freq: Once | ORAL | Status: DC | PRN

## 2013-06-10 MED ORDER — SODIUM CHLORIDE 0.9 % IR SOLN
Status: DC | PRN
  Administered 2013-06-10: 12:00:00

## 2013-06-10 MED ORDER — ONDANSETRON HCL 4 MG/2ML IJ SOLN: 4.0000 mg | Freq: Once | INTRAMUSCULAR | Status: DC | PRN

## 2013-06-10 MED ORDER — FENTANYL CITRATE 0.05 MG/ML IJ SOLN
25.0000 ug | INTRAMUSCULAR | Status: DC | PRN
  Administered 2013-06-10: 50 ug via INTRAVENOUS
  Administered 2013-06-10: 25 ug via INTRAVENOUS

## 2013-06-10 MED ORDER — LIDOCAINE-EPINEPHRINE 0.5 %-1:200000 IJ SOLN
INTRAMUSCULAR | Status: AC
  Filled 2013-06-10: qty 1

## 2013-06-10 MED ORDER — LIDOCAINE-EPINEPHRINE 0.5 %-1:200000 IJ SOLN
INTRAMUSCULAR | Status: DC | PRN
  Administered 2013-06-10: 14 mL

## 2013-06-10 MED ORDER — PROPOFOL 10 MG/ML IV BOLUS
INTRAVENOUS | Status: DC | PRN
  Administered 2013-06-10 (×4): 20 mg via INTRAVENOUS
  Administered 2013-06-10: 30 mg via INTRAVENOUS
  Administered 2013-06-10: 20 mg via INTRAVENOUS

## 2013-06-10 MED ORDER — OXYCODONE HCL 5 MG/5ML PO SOLN: 5.0000 mg | Freq: Once | ORAL | Status: DC | PRN

## 2013-06-10 MED ORDER — SODIUM CHLORIDE 0.9 % IV SOLN: INTRAVENOUS | Status: DC

## 2013-06-10 MED ORDER — SODIUM CHLORIDE 0.9 % IR SOLN
Status: DC | PRN
  Administered 2013-06-10: 1000 mL

## 2013-06-10 MED ORDER — FENTANYL CITRATE 0.05 MG/ML IJ SOLN
INTRAMUSCULAR | Status: DC | PRN
  Administered 2013-06-10 (×5): 50 ug via INTRAVENOUS

## 2013-06-10 MED ORDER — HEPARIN SODIUM (PORCINE) 1000 UNIT/ML IJ SOLN
INTRAMUSCULAR | Status: DC | PRN
  Administered 2013-06-10: 5 mL

## 2013-06-10 MED ORDER — LIDOCAINE HCL (CARDIAC) 20 MG/ML IV SOLN
INTRAVENOUS | Status: DC | PRN
  Administered 2013-06-10: 80 mg via INTRAVENOUS

## 2013-06-10 MED ORDER — OXYCODONE HCL 15 MG PO TABS: 15.0000 mg | ORAL_TABLET | Freq: Three times a day (TID) | ORAL | Status: DC | PRN

## 2013-06-10 MED ORDER — MIDAZOLAM HCL 5 MG/5ML IJ SOLN
INTRAMUSCULAR | Status: DC | PRN
  Administered 2013-06-10: 2 mg via INTRAVENOUS

## 2013-06-10 SURGICAL SUPPLY — 47 items
BAG DECANTER FOR FLEXI CONT (MISCELLANEOUS) ×3 IMPLANT
CATH CANNON HEMO 15F 50CM (CATHETERS) IMPLANT
CATH CANNON HEMO 15FR 19 (HEMODIALYSIS SUPPLIES) IMPLANT
CATH CANNON HEMO 15FR 23CM (HEMODIALYSIS SUPPLIES) IMPLANT
CATH CANNON HEMO 15FR 31CM (HEMODIALYSIS SUPPLIES) IMPLANT
CATH CANNON HEMO 15FR 32 (HEMODIALYSIS SUPPLIES) IMPLANT
CATH CANNON HEMO 15FR 32CM (HEMODIALYSIS SUPPLIES) ×3 IMPLANT
CLIP LIGATING EXTRA MED SLVR (CLIP) ×3 IMPLANT
CLIP LIGATING EXTRA SM BLUE (MISCELLANEOUS) ×3 IMPLANT
CLOTH BEACON ORANGE TIMEOUT ST (SAFETY) ×3 IMPLANT
COVER PROBE W GEL 5X96 (DRAPES) IMPLANT
COVER SURGICAL LIGHT HANDLE (MISCELLANEOUS) ×3 IMPLANT
DECANTER SPIKE VIAL GLASS SM (MISCELLANEOUS) ×3 IMPLANT
DRAPE C-ARM 42X72 X-RAY (DRAPES) ×3 IMPLANT
DRAPE CHEST BREAST 15X10 FENES (DRAPES) ×3 IMPLANT
GAUZE SPONGE 2X2 8PLY STRL LF (GAUZE/BANDAGES/DRESSINGS) ×2 IMPLANT
GAUZE SPONGE 4X4 16PLY XRAY LF (GAUZE/BANDAGES/DRESSINGS) ×3 IMPLANT
GLOVE BIO SURGEON STRL SZ 6.5 (GLOVE) ×2 IMPLANT
GLOVE BIOGEL PI IND STRL 6.5 (GLOVE) ×1 IMPLANT
GLOVE BIOGEL PI INDICATOR 6.5 (GLOVE) ×1
GLOVE SS BIOGEL STRL SZ 7.5 (GLOVE) ×2 IMPLANT
GLOVE SUPERSENSE BIOGEL SZ 7.5 (GLOVE) ×1
GLOVE SURG SS PI 6.5 STRL IVOR (GLOVE) ×2 IMPLANT
GOWN STRL NON-REIN LRG LVL3 (GOWN DISPOSABLE) ×4 IMPLANT
KIT BASIN OR (CUSTOM PROCEDURE TRAY) ×3 IMPLANT
KIT ROOM TURNOVER OR (KITS) ×3 IMPLANT
NDL 18GX1X1/2 (RX/OR ONLY) (NEEDLE) ×1 IMPLANT
NDL HYPO 25GX1X1/2 BEV (NEEDLE) ×2 IMPLANT
NEEDLE 18GX1X1/2 (RX/OR ONLY) (NEEDLE) ×3 IMPLANT
NEEDLE 22X1 1/2 (OR ONLY) (NEEDLE) ×3 IMPLANT
NEEDLE HYPO 25GX1X1/2 BEV (NEEDLE) ×3 IMPLANT
NS IRRIG 1000ML POUR BTL (IV SOLUTION) ×3 IMPLANT
PACK SURGICAL SETUP 50X90 (CUSTOM PROCEDURE TRAY) ×3 IMPLANT
PAD ARMBOARD 7.5X6 YLW CONV (MISCELLANEOUS) ×6 IMPLANT
SOAP 2 % CHG 4 OZ (WOUND CARE) ×3 IMPLANT
SPONGE GAUZE 2X2 STER 10/PKG (GAUZE/BANDAGES/DRESSINGS) ×1
SUT ETHILON 3 0 PS 1 (SUTURE) ×3 IMPLANT
SUT VICRYL 4-0 PS2 18IN ABS (SUTURE) ×3 IMPLANT
SYR 20CC LL (SYRINGE) ×3 IMPLANT
SYR 30ML LL (SYRINGE) IMPLANT
SYR 5ML LL (SYRINGE) ×6 IMPLANT
SYR CONTROL 10ML LL (SYRINGE) ×3 IMPLANT
SYRINGE 10CC LL (SYRINGE) ×3 IMPLANT
TAPE CLOTH SURG 4X10 WHT LF (GAUZE/BANDAGES/DRESSINGS) ×2 IMPLANT
TOWEL OR 17X24 6PK STRL BLUE (TOWEL DISPOSABLE) ×3 IMPLANT
TOWEL OR 17X26 10 PK STRL BLUE (TOWEL DISPOSABLE) ×3 IMPLANT
WATER STERILE IRR 1000ML POUR (IV SOLUTION) ×3 IMPLANT

## 2013-06-10 NOTE — Anesthesia Preprocedure Evaluation (Addendum)
Anesthesia Evaluation  Patient identified by MRN, date of birth, ID band Patient awake    Reviewed: Allergy & Precautions, H&P , NPO status , Patient's Chart, lab work & pertinent test results, reviewed documented beta blocker date and time   History of Anesthesia Complications Negative for: history of anesthetic complications  Airway Mallampati: III TM Distance: >3 FB Neck ROM: Full    Dental  (+) Edentulous Upper, Partial Lower and Dental Advisory Given   Pulmonary asthma , former smoker,  breath sounds clear to auscultation        Cardiovascular hypertension, Pt. on medications and Pt. on home beta blockers - angina+ CAD, + Past MI, + Peripheral Vascular Disease and +CHF Rhythm:Regular Rate:Normal     Neuro/Psych  Neuromuscular disease    GI/Hepatic   Endo/Other  diabetes, Well Controlled, Type 2, Insulin Dependent  Renal/GU Dialysis and ESRFRenal disease     Musculoskeletal negative musculoskeletal ROS (+)   Abdominal (+) + obese,   Peds  Hematology negative hematology ROS (+)   Anesthesia Other Findings   Reproductive/Obstetrics negative OB ROS                         Anesthesia Physical Anesthesia Plan  ASA: III  Anesthesia Plan: MAC   Post-op Pain Management:    Induction: Intravenous  Airway Management Planned: Natural Airway and Simple Face Mask  Additional Equipment:   Intra-op Plan:   Post-operative Plan:   Informed Consent: I have reviewed the patients History and Physical, chart, labs and discussed the procedure including the risks, benefits and alternatives for the proposed anesthesia with the patient or authorized representative who has indicated his/her understanding and acceptance.     Plan Discussed with: CRNA and Anesthesiologist  Anesthesia Plan Comments: (ESRD last HD 8/6 K-3.7 Type 2 DM glucose 221 Obesity Htn H/O CAD S/P CABG 2002 No angina  Plan  MAC  Kipp Brood, MD)        Anesthesia Quick Evaluation

## 2013-06-10 NOTE — Anesthesia Postprocedure Evaluation (Signed)
  Anesthesia Post-op Note  Patient: Timothy Banks  Procedure(s) Performed: Procedure(s): REMOVAL OF RIGHT INTERNAL JUGULAR TUNNELED DIALYSIS CATHETER (Right) INSERTION OF LEFT INTERNAL JUGULAR TUNNELED DIALYSIS CATHETER (Left)  Patient Location: PACU  Anesthesia Type:MAC  Level of Consciousness: awake, alert  and oriented  Airway and Oxygen Therapy: Patient Spontanous Breathing  Post-op Pain: none  Post-op Assessment: Post-op Vital signs reviewed, Patient's Cardiovascular Status Stable, Respiratory Function Stable, Patent Airway and Pain level controlled  Post-op Vital Signs: stable  Complications: No apparent anesthesia complications

## 2013-06-10 NOTE — H&P (View-Only) (Signed)
[facility] [place]  Postoperative Access Visit  History of Present Illness  Timothy Banks is a 57 y.o. year old male who presents for postoperative follow-up for: Ligation of R RC AVF (Date: 04/27/12).  The R RC AVF was ligated due to progression of steal sx after a R RC AVF placed by Dr. Fields on 02/27/13.  The pt also had RIJV TDC placed on 03/12/13.  The patient's wounds are healed.  The patient notes ulnar distribution numbness and weakness in his right hand, non-dominant.  The patient is able to complete their activities of daily living.  The patient's current symptoms are: pain , numbness, and weakness in R hand.  The notes the flow rates on the RIJV TDC have decreased to the point that they have to run him for 5 hrs rather than 3 hrs.  For VQI Use Only  PRE-ADM LIVING: Home  AMB STATUS: Ambulatory  Physical Examination Filed Vitals:   06/05/13 1513  BP: 142/72  Pulse: 72  Resp: 16    RUE: Incision is healed, skin feels warm, hand grip is 4/5, sensation in digits is grossly decreased in fingers 3-5, non-palpable thrill, no bruit can be auscultated , palpable radial pulse  Medical Decision Making  Timothy Banks is a 57 y.o. 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  Pt has residual sx in the R hand that I am not convinced are truly due to steal syndrome.  Arterial duplex of R arm is ordered.  Additionally, I am referring him to Hand Surgery for further evaluation for possible cubital tunnel syndrome.    Also I have referred him to OT for rehab of his R hand.  Due to poor flow rates in the RIJ TDC, I recommend removal of the RIJ TDC and placement in the LIJV.  This is scheduled for this coming Wednesday 6 AUG 14 with Dr. Early.  After these consults on completed, the patient will follow up with Dr. Fields.  Thank you for allowing us to participate in this patient's care.  Brian Chen, MD [facility] [place] Office: [phone] Pager: [phone]  06/05/2013, 3:29 PM    

## 2013-06-10 NOTE — Preoperative (Signed)
Beta Blockers   Reason not to administer Beta Blockers:Not Applicable 

## 2013-06-10 NOTE — Progress Notes (Signed)
Report given to lauren reynolds rn as cargive

## 2013-06-10 NOTE — Op Note (Signed)
OPERATIVE REPORT  DATE OF SURGERY: 06/10/2013  PATIENT: Timothy Banks, 57 y.o. male MRN: [id number]  DOB: 01-Oct-1956  PRE-OPERATIVE DIAGNOSIS: End-stage renal disease with poorly functioning right IJ dialysis catheter  POST-OPERATIVE DIAGNOSIS:  Same  PROCEDURE: #1 placement of new left IJ dialysis catheter #2 removal of existing right IJ dialysis cath  SURGEON:  Gretta Began, M.D.  PHYSICIAN ASSISTANT: Nurse  ANESTHESIA:  Local with sedation  EBL: Minimal ml  Total I/O In: 250 [I.V.:250] Out: 20 [Blood:20]  BLOOD ADMINISTERED: None  DRAINS: None  SPECIMEN: None  COUNTS CORRECT:  YES  PLAN OF CARE: PACU   PATIENT DISPOSITION:  PACU - hemodynamically stable  PROCEDURE DETAILS:  the patient was taken to the operating room placed supine position where the area of the right and left neck and chest were prepped using local anesthesia an 18-gauge needle the internal jugular vein was accessed the guidewire specimen of level the right atrium this was confirmed with fluoroscopy. A dilator and peel-away sheath were passed over the guidewire and the dilator and guidewire removed. A 27 cm hemodialysis catheter was passed through the peel-away sheath and the peel-away sheath was removed. The catheter tips were positioned at the level of the distal right atrium. The catheter was brought through a subcutaneous tunnel through a separate stab incision using local anesthesia. A 2 lm ports were attached to the catheter. Both lumens flushed and aspirated easily and were locked with 1000 unit per cc heparin. The catheter was secured to the skin with a 3-0 nylon stitch and the entry site was closed with a 4-0 subcuticular stitch. and draped in usual sterile fashion. SonoSAttention was then turned next next attention was turned to the right neck. The subcutaneous, was mobilized using local anesthesia and the catheter including a felt cuff were removed in entirety. Sterile dressing was applied and the  patient was taken to recovery room where chest x-ray is pendingite ultrasound was used to visualize the jugular vein which was widely patent on the left.   Gretta Began, M.D. 06/10/2013 1:04 PM

## 2013-06-10 NOTE — Transfer of Care (Signed)
Immediate Anesthesia Transfer of Care Note  Patient: Timothy Banks  Procedure(s) Performed: Procedure(s): REMOVAL OF RIGHT INTERNAL JUGULAR TUNNELED DIALYSIS CATHETER (Right) INSERTION OF LEFT INTERNAL JUGULAR TUNNELED DIALYSIS CATHETER (Left)  Patient Location: PACU  Anesthesia Type:MAC  Level of Consciousness: awake, alert  and oriented  Airway & Oxygen Therapy: Patient Spontanous Breathing and Patient connected to nasal cannula oxygen  Post-op Assessment: Report given to PACU RN and Post -op Vital signs reviewed and stable  Post vital signs: Reviewed and stable  Complications: No apparent anesthesia complications

## 2013-06-10 NOTE — Interval H&P Note (Signed)
History and Physical Interval Note:  06/10/2013 11:16 AM  [name]  has presented today for surgery, with the diagnosis of End Stage Renal Disease  The various methods of treatment have been discussed with the patient and family. After consideration of risks, benefits and other options for treatment, the patient has consented to  Procedure(s): REMOVAL OF RIGHT INTERNAL JUGULAR TUNNELED DIALYSIS CATHETER (Right) INSERTION OF LEFT INTERNAL JUGULAR TUNNELED DIALYSIS CATHETER (Left) as a surgical intervention .  The patient's history has been reviewed, patient examined, no change in status, stable for surgery.  I have reviewed the patient's chart and labs.  Questions were answered to the patient's satisfaction.     Timothy Banks

## 2013-06-11 ENCOUNTER — Encounter (HOSPITAL_COMMUNITY): Payer: Self-pay | Admitting: Vascular Surgery

## 2013-06-12 ENCOUNTER — Telehealth: Payer: Self-pay | Admitting: Vascular Surgery

## 2013-06-12 ENCOUNTER — Ambulatory Visit: Payer: Medicare Other | Attending: Internal Medicine | Admitting: Occupational Therapy

## 2013-06-12 NOTE — Telephone Encounter (Signed)
On 06/05/2013, [facility] referred pt to Hand Surgery and OT for R hand pain and numbness.  Timothy Banks missed his OT appointment on 06/12/2013 and also has not returned the phone call to Dr Amanda Pea (hand surgery) to schedule an evaluation.  I spoke with Timothy Banks today to see if I could assist him in rescheduling the OT appointment, as well as initiating an appointment with Dr Amanda Pea. Timothy Banks said he didn't want to reschedule at this time, as he would "probably end up in the hospital this weekend with all the mess he's going through". He did not wish to elaborate and will call to reschedule his OT appointment when he knows more. dpm

## 2013-06-23 ENCOUNTER — Other Ambulatory Visit: Payer: Self-pay | Admitting: *Deleted

## 2013-06-23 DIAGNOSIS — N186 End stage renal disease: Secondary | ICD-10-CM

## 2013-06-23 DIAGNOSIS — Z01810 Encounter for preprocedural cardiovascular examination: Secondary | ICD-10-CM

## 2013-07-08 ENCOUNTER — Encounter: Payer: Self-pay | Admitting: Vascular Surgery

## 2013-07-09 ENCOUNTER — Encounter (INDEPENDENT_AMBULATORY_CARE_PROVIDER_SITE_OTHER): Payer: Medicare Other | Admitting: *Deleted

## 2013-07-09 ENCOUNTER — Ambulatory Visit (INDEPENDENT_AMBULATORY_CARE_PROVIDER_SITE_OTHER): Payer: Medicare Other | Admitting: Vascular Surgery

## 2013-07-09 ENCOUNTER — Encounter: Payer: Self-pay | Admitting: Vascular Surgery

## 2013-07-09 ENCOUNTER — Ambulatory Visit: Payer: Medicare Other | Admitting: Vascular Surgery

## 2013-07-09 ENCOUNTER — Encounter: Payer: Medicare Other | Admitting: *Deleted

## 2013-07-09 VITALS — BP 149/66 | HR 88 | Ht 67.0 in | Wt 328.0 lb

## 2013-07-09 DIAGNOSIS — N186 End stage renal disease: Secondary | ICD-10-CM

## 2013-07-09 DIAGNOSIS — Z01810 Encounter for preprocedural cardiovascular examination: Secondary | ICD-10-CM

## 2013-07-09 NOTE — Progress Notes (Signed)
[facility] [name] H&P NOTE  MRN : [id number]  CC: ESRD Dialysis T,Th,Sat [place] - Dr. Powell  History of Present Illness: Timothy Banks is a 56 y.o. male Who had a right Cimino fistula placed 02/25/13.  He developed steal with numbness and weakness in the right hand and had ligation of fistula in June 2014 by Dr. Chen. He has had both Right and Left IJ catheters which the pt states have not worked well. His left IJ catheter was placed by Dr. Early on 06/10/2013.  He states his symptoms in the right hand have not improved. Dr Chen had referred pt to a hand surgeon for evaluation but pt does not want to do that at this time.  He is LHD.  He had a chain saw accident in the left forearm  Current Outpatient Prescriptions  Medication Sig Dispense Refill  . albuterol (PROVENTIL HFA;VENTOLIN HFA) 108 (90 BASE) MCG/ACT inhaler Inhale 2 puffs into the lungs every 6 (six) hours as needed for wheezing or shortness of breath.      . ALPRAZolam (XANAX) 0.5 MG tablet Take 0.5 mg by mouth at bedtime.      . amLODipine (NORVASC) 10 MG tablet Take 10 mg by mouth daily.      . buPROPion (WELLBUTRIN XL) 300 MG 24 hr tablet Take 300 mg by mouth every morning.      . furosemide (LASIX) 80 MG tablet Take 2 tablets (160 mg total) by mouth 2 (two) times daily.  60 tablet  0  . glipiZIDE (GLUCOTROL) 10 MG tablet Take 20 mg by mouth 2 (two) times daily before a meal.      . hydrALAZINE (APRESOLINE) 50 MG tablet Take 50-100 mg by mouth 2 (two) times daily. Takes 100mg in the morning & 50mg at night      . insulin glargine (LANTUS) 100 UNIT/ML injection Inject 30 Units into the skin at bedtime.      . isosorbide mononitrate (IMDUR) 60 MG 24 hr tablet Take 30 mg by mouth daily.      . metoprolol (LOPRESSOR) 50 MG tablet Take 1 tablet (50 mg total) by mouth 2 (two) times daily.  60 tablet  0  . nitroGLYCERIN (NITROSTAT) 0.4 MG SL tablet Place 0.4 mg under the tongue every 5 (five) minutes as  needed for chest pain. x3 doses as needed for chest pain      . oxyCODONE (ROXICODONE) 15 MG immediate release tablet Take 1 tablet (15 mg total) by mouth every 8 (eight) hours as needed for pain. For pain  30 tablet  0  . simvastatin (ZOCOR) 20 MG tablet Take 20 mg by mouth every evening.       No current facility-administered medications for this visit.    Pt meds include: Statin :No Betablocker: Yes ASA: No Other anticoagulants/antiplatelets: none  Past Medical History  Diagnosis Date  . Myocardial infarction   . Diabetes mellitus without complication   . CHF (congestive heart failure)   . Asthma   . Hyperlipidemia   . Hypertension   . Chronic kidney disease 02/17/2013    ACUTE RENAL  . Coronary artery disease   . Anginal pain   . Neuromuscular disorder     neuropathy in hands and feet    Past Surgical History  Procedure Laterality Date  . Right carotid endarterectomy  2002    right side  . Coronary artery bypass graft  2002  . Av fistula placement Right 02/25/2013      Procedure: ARTERIOVENOUS (AV) FISTULA CREATION;  Surgeon: Charles E Fields, MD;  Location: [facility] OR;  Service: Vascular;  Laterality: Right;  Right Radiocephalic AVF  . Insertion of dialysis catheter Right 03/12/2013    Procedure: ultrasound guided INSERTION OF DIALYSIS CATHETER right internal jugular using arrow 23cm;  Surgeon: Christopher S Dickson, MD;  Location: [facility] OR;  Service: Vascular;  Laterality: Right;  . Ligation of arteriovenous  fistula Right 04/27/2013    Procedure: LIGATION OF RIGHT RADIAL CEPHALIC ARTERIOVENOUS  FISTULA;  Surgeon: Brian L Chen, MD;  Location: [facility] OR;  Service: Vascular;  Laterality: Right;  . Eye surgery      cataract surgery both eyes  . Removal of a dialysis catheter Right 06/10/2013    Procedure: REMOVAL OF RIGHT INTERNAL JUGULAR TUNNELED DIALYSIS CATHETER;  Surgeon: Todd F Early, MD;  Location: [facility] OR;  Service: Vascular;  Laterality: Right;  . Insertion of dialysis catheter Left  06/10/2013    Procedure: INSERTION OF LEFT INTERNAL JUGULAR TUNNELED DIALYSIS CATHETER;  Surgeon: Todd F Early, MD;  Location: [facility] OR;  Service: Vascular;  Laterality: Left;    Social History History  Substance Use Topics  . Smoking status: Former Smoker -- 1.00 packs/day for 25 years    Start date: 11/05/1973    Quit date: 06/09/2003  . Smokeless tobacco: Never Used     Comment: quit for 10 years, started back 9 mo ago  . Alcohol Use: No    Family History Family History  Problem Relation Age of Onset  . Diabetes Mother   . Heart disease Mother   . Diabetes Father   . Heart disease Father     No Known Allergies   REVIEW OF SYSTEMS  General: [ ] Weight loss, [ ] Fever, [ ] chills Neurologic: [ ] Dizziness, [ ] Blackouts, [ ] Seizure [ ] Stroke, [ ] "Mini stroke", [ ] Slurred speech, [ ] Temporary blindness; [ ] weakness in arms or legs, [ ] Hoarseness [ ] Dysphagia Cardiac: [ ] Chest pain/pressure, [ ] Shortness of breath at rest [ ] Shortness of breath with exertion, [ ] Atrial fibrillation or irregular heartbeat  Vascular: [x ] Pain in legs with walking, [ ] Pain in legs at rest, [ ] Pain in legs at night,  [ ] Non-healing ulcer, [ ] Blood clot in vein/DVT,   Pulmonary: [ ] Home oxygen, [ ] Productive cough, [ ] Coughing up blood, [ ] Asthma,  [ ] Wheezing [ ] COPD Musculoskeletal:  [ ] Arthritis, [ ] Low back pain, [ ] Joint pain Hematologic: [ ] Easy Bruising, [ ] Anemia; [ ] Hepatitis Gastrointestinal: [ ] Blood in stool, [ ] Gastroesophageal Reflux/heartburn, Urinary: [x ] chronic Kidney disease, [ ] on HD - [ ] MWF or [x ] TTHS, [ ] Burning with urination, [ ] Difficulty urinating Skin: [ ] Rashes, [ ] Wounds Psychological: [ ] Anxiety, [ ] Depression  Physical Examination Filed Vitals:   07/09/13 1419  BP: 149/66  Pulse: 88  Height: 5' 7" (1.702 m)  Weight: 328 lb (148.78 kg)  SpO2: 95%   Body mass index is 51.36 kg/(m^2).  General: Obese WDWN male in  NAD Gait: Normal HENT: WNL Eyes: Pupils equal Pulmonary: normal non-labored breathing , with Rales at bases, rhonchi,  wheezing Cardiac: RRR, without  Murmurs, Skin: no rashes, ulcers noted;  no Gangrene , no cellulitis; no open wounds;  Vascular Exam/Pulses: 2+ radial pulses bilat No thrill or   bruit in right cimino fistula Long scar on left forarm well healed Musculoskeletal: positive muscle wasting 1st web space right hand; no edema  Neurologic: A&O X 3; Appropriate Affect ;  SENSATION: normal; MOTOR FUNCTION: 5/5 Symmetric except 4/5 grip on right Speech is fluent/normal   Significant Diagnostic Studies: CBC Lab Results  Component Value Date   WBC 12.9* 03/14/2013   HGB 13.6 06/10/2013   HCT 40.0 06/10/2013   MCV 88.1 03/14/2013   PLT 223 03/14/2013    BMET    Component Value Date/Time   NA 134* 06/10/2013 0850   K 3.7 06/10/2013 0850   CL 94* 03/14/2013 0700   CO2 33* 03/14/2013 0700   GLUCOSE 245* 06/10/2013 0850   BUN 42* 03/14/2013 0700   CREATININE 2.69* 03/14/2013 0700   CALCIUM 8.6 03/14/2013 0700   GFRNONAA 25* 03/14/2013 0700   GFRAA 29* 03/14/2013 0700   Estimated Creatinine Clearance: 43 ml/min (by C-G formula based on Cr of 2.69).  COAG No results found for this basename: INR, PROTIME     Non-Invasive Vascular Imaging: Vein mapping shows adequate cephalic  And basilic vein on left > 3mm  ASSESSMENT/PLAN: left brachiocephalic AVF and placement new diatek catheter on 07/20/13  Right hand steal syndrome - again offered hand eval - pt would like to do strengthening exercises on his on  Poss PVD. Pt did not C/O leg pain today. We will get ABI's and arterial duplex of BLE at F/U visit as there is a note from Dr. Welsh re leg pain   Kassey Laforest J 07/09/2013 2:47 PM    History and exam details as above. The patient previously had complaints of pain in his right hand. This has not improved significantly post ligation of his fistula. However he does not wish referral  for a hand evaluation. Vein mapping shows his best option for the left upper extremity with a brachiocephalic fistula. I did discuss with the patient that he would be at risk of steal. He would also be at risk for non-maturation of the fistula. He understands and agrees to proceed. We will also replace his dialysis catheter at the same time as this is functioning poorly. Procedure is scheduled for September 15. He will have ABIs and arterial duplex of both lower extremities at his followup visit for his fistula to evaluate leg pain.  Charles Fields, MD [facility] [place] Office: [phone] Pager: [phone]  

## 2013-07-10 ENCOUNTER — Other Ambulatory Visit: Payer: Self-pay | Admitting: *Deleted

## 2013-07-10 ENCOUNTER — Telehealth: Payer: Self-pay | Admitting: Vascular Surgery

## 2013-07-10 NOTE — Telephone Encounter (Signed)
[facility] had asked that Timothy Banks be referred for hand surgery. A referral was faxed to Dr Mathis Bud office on 06/08/2013.  07/10/13: Received a fax notice from Claudean Severance at [facility]. Timothy Banks has not returned their phone calls to set up an appointment as of 07/10/2013.  (Of note: patient was seen by CEF yesterday 09/04, and in the progress note, it is mentioned that Timothy Banks did not wish to proceed with hand evaluation at this time.)  The above mentioned fax from [facility] has been sent to the scan center for further documentation.  D.Millikan

## 2013-07-14 ENCOUNTER — Telehealth: Payer: Self-pay | Admitting: Vascular Surgery

## 2013-07-14 NOTE — Telephone Encounter (Signed)
OT Consult requested by [facility]- appointment made for 06/12/2013 for OT evaluation, pt was aware.  Per Darius Bump, pt did not show to OT evaluation on 08/08. She spoke with the patient and he did not wish to proceed with therapy at this time.  FYI for [facility] has been sent via staff message.  Annabelle Harman

## 2013-07-15 ENCOUNTER — Encounter (HOSPITAL_COMMUNITY): Payer: Self-pay | Admitting: Pharmacy Technician

## 2013-07-17 NOTE — Progress Notes (Signed)
I checked with [facility] to see if they have any other phone number for patient and they do not.

## 2013-07-19 MED ORDER — DEXTROSE 5 % IV SOLN
1.5000 g | INTRAVENOUS | Status: AC
  Administered 2013-07-20: 1.5 g via INTRAVENOUS
  Filled 2013-07-19 (×2): qty 1.5

## 2013-07-20 ENCOUNTER — Ambulatory Visit (HOSPITAL_COMMUNITY): Payer: Medicare Other

## 2013-07-20 ENCOUNTER — Telehealth: Payer: Self-pay | Admitting: Vascular Surgery

## 2013-07-20 ENCOUNTER — Ambulatory Visit (HOSPITAL_COMMUNITY): Payer: Medicare Other | Admitting: Anesthesiology

## 2013-07-20 ENCOUNTER — Encounter (HOSPITAL_COMMUNITY): Payer: Self-pay

## 2013-07-20 ENCOUNTER — Ambulatory Visit (HOSPITAL_COMMUNITY)
Admission: RE | Admit: 2013-07-20 | Discharge: 2013-07-20 | Disposition: A | Discharge status: Home / Self Care | Payer: Medicare Other | Source: Ambulatory Visit | Attending: Vascular Surgery | Admitting: Vascular Surgery

## 2013-07-20 ENCOUNTER — Encounter (HOSPITAL_COMMUNITY)
Admission: RE | Disposition: A | Discharge status: Home / Self Care | Payer: Self-pay | Source: Ambulatory Visit | Attending: Vascular Surgery

## 2013-07-20 ENCOUNTER — Encounter (HOSPITAL_COMMUNITY): Payer: Self-pay | Admitting: Anesthesiology

## 2013-07-20 DIAGNOSIS — Z794 Long term (current) use of insulin: Secondary | ICD-10-CM | POA: Insufficient documentation

## 2013-07-20 DIAGNOSIS — N186 End stage renal disease: Secondary | ICD-10-CM | POA: Insufficient documentation

## 2013-07-20 DIAGNOSIS — I509 Heart failure, unspecified: Secondary | ICD-10-CM | POA: Insufficient documentation

## 2013-07-20 DIAGNOSIS — T82898A Other specified complication of vascular prosthetic devices, implants and grafts, initial encounter: Secondary | ICD-10-CM

## 2013-07-20 DIAGNOSIS — E119 Type 2 diabetes mellitus without complications: Secondary | ICD-10-CM | POA: Insufficient documentation

## 2013-07-20 DIAGNOSIS — Z79899 Other long term (current) drug therapy: Secondary | ICD-10-CM | POA: Insufficient documentation

## 2013-07-20 DIAGNOSIS — Z992 Dependence on renal dialysis: Secondary | ICD-10-CM | POA: Insufficient documentation

## 2013-07-20 DIAGNOSIS — I120 Hypertensive chronic kidney disease with stage 5 chronic kidney disease or end stage renal disease: Secondary | ICD-10-CM | POA: Insufficient documentation

## 2013-07-20 HISTORY — PX: AV FISTULA PLACEMENT: SHX1204

## 2013-07-20 HISTORY — PX: REMOVAL OF A DIALYSIS CATHETER: SHX6053

## 2013-07-20 HISTORY — PX: INSERTION OF DIALYSIS CATHETER: SHX1324

## 2013-07-20 LAB — POCT I-STAT 4, (NA,K, GLUC, HGB,HCT)
Hemoglobin: 13.6 g/dL (ref 13.0–17.0)
Potassium: 3.4 mEq/L — ABNORMAL LOW (ref 3.5–5.1)

## 2013-07-20 SURGERY — REMOVAL, DIALYSIS CATHETER
Anesthesia: Monitor Anesthesia Care | Site: Chest | Laterality: Right | Wound class: Clean

## 2013-07-20 MED ORDER — LIDOCAINE-EPINEPHRINE (PF) 1 %-1:200000 IJ SOLN
INTRAMUSCULAR | Status: DC | PRN
  Administered 2013-07-20: 30 mL

## 2013-07-20 MED ORDER — OXYCODONE-ACETAMINOPHEN 5-325 MG PO TABS: 1.0000 | ORAL_TABLET | Freq: Four times a day (QID) | ORAL | Status: DC | PRN

## 2013-07-20 MED ORDER — FENTANYL CITRATE 0.05 MG/ML IJ SOLN
INTRAMUSCULAR | Status: DC | PRN
  Administered 2013-07-20: 25 ug via INTRAVENOUS
  Administered 2013-07-20 (×3): 50 ug via INTRAVENOUS
  Administered 2013-07-20: 25 ug via INTRAVENOUS
  Administered 2013-07-20: 50 ug via INTRAVENOUS

## 2013-07-20 MED ORDER — BUPIVACAINE HCL (PF) 0.5 % IJ SOLN
INTRAMUSCULAR | Status: DC | PRN
  Administered 2013-07-20: 30 mL

## 2013-07-20 MED ORDER — HEPARIN SODIUM (PORCINE) 1000 UNIT/ML IJ SOLN
INTRAMUSCULAR | Status: DC | PRN
  Administered 2013-07-20: 1000 [IU]

## 2013-07-20 MED ORDER — 0.9 % SODIUM CHLORIDE (POUR BTL) OPTIME
TOPICAL | Status: DC | PRN
  Administered 2013-07-20: 1000 mL

## 2013-07-20 MED ORDER — BUPIVACAINE HCL (PF) 0.5 % IJ SOLN
INTRAMUSCULAR | Status: AC
  Filled 2013-07-20: qty 10

## 2013-07-20 MED ORDER — LIDOCAINE-EPINEPHRINE (PF) 1 %-1:200000 IJ SOLN
INTRAMUSCULAR | Status: AC
  Filled 2013-07-20: qty 20

## 2013-07-20 MED ORDER — MEPERIDINE HCL 25 MG/ML IJ SOLN: 6.2500 mg | INTRAMUSCULAR | Status: DC | PRN

## 2013-07-20 MED ORDER — MIDAZOLAM HCL 5 MG/5ML IJ SOLN
INTRAMUSCULAR | Status: DC | PRN
  Administered 2013-07-20: 2 mg via INTRAVENOUS

## 2013-07-20 MED ORDER — OXYCODONE HCL 5 MG PO TABS
ORAL_TABLET | ORAL | Status: AC
  Filled 2013-07-20: qty 1

## 2013-07-20 MED ORDER — SODIUM CHLORIDE 0.9 % IV SOLN
INTRAVENOUS | Status: DC
  Administered 2013-07-20: 07:00:00 via INTRAVENOUS

## 2013-07-20 MED ORDER — SODIUM CHLORIDE 0.9 % IR SOLN
Status: DC | PRN
  Administered 2013-07-20: 08:00:00

## 2013-07-20 MED ORDER — PROPOFOL INFUSION 10 MG/ML OPTIME
INTRAVENOUS | Status: DC | PRN
  Administered 2013-07-20: 75 ug/kg/min via INTRAVENOUS

## 2013-07-20 MED ORDER — PROPOFOL 10 MG/ML IV BOLUS
INTRAVENOUS | Status: DC | PRN
  Administered 2013-07-20 (×7): 20 mg via INTRAVENOUS

## 2013-07-20 MED ORDER — HYDROMORPHONE HCL PF 1 MG/ML IJ SOLN
0.2500 mg | INTRAMUSCULAR | Status: DC | PRN
  Administered 2013-07-20 (×4): 0.5 mg via INTRAVENOUS

## 2013-07-20 MED ORDER — HYDROMORPHONE HCL PF 1 MG/ML IJ SOLN
INTRAMUSCULAR | Status: AC
  Filled 2013-07-20: qty 1

## 2013-07-20 MED ORDER — HEPARIN SODIUM (PORCINE) 1000 UNIT/ML IJ SOLN
INTRAMUSCULAR | Status: AC
  Filled 2013-07-20: qty 1

## 2013-07-20 MED ORDER — LIDOCAINE HCL (CARDIAC) 20 MG/ML IV SOLN
INTRAVENOUS | Status: DC | PRN
  Administered 2013-07-20: 100 mg via INTRAVENOUS

## 2013-07-20 MED ORDER — THROMBIN 20000 UNITS EX SOLR
CUTANEOUS | Status: AC
  Filled 2013-07-20: qty 20000

## 2013-07-20 MED ORDER — ONDANSETRON HCL 4 MG/2ML IJ SOLN: 4.0000 mg | Freq: Once | INTRAMUSCULAR | Status: DC | PRN

## 2013-07-20 MED ORDER — OXYCODONE HCL 5 MG/5ML PO SOLN: 5.0000 mg | Freq: Once | ORAL | Status: AC | PRN

## 2013-07-20 MED ORDER — OXYCODONE HCL 5 MG PO TABS
5.0000 mg | ORAL_TABLET | Freq: Once | ORAL | Status: AC | PRN
  Administered 2013-07-20: 5 mg via ORAL

## 2013-07-20 MED ORDER — ISOSORBIDE MONONITRATE ER 60 MG PO TB24
60.0000 mg | ORAL_TABLET | Freq: Every day | ORAL | Status: DC
  Administered 2013-07-20 (×2): 60 mg via ORAL
  Filled 2013-07-20: qty 1

## 2013-07-20 MED ORDER — BUPIVACAINE HCL (PF) 0.5 % IJ SOLN
INTRAMUSCULAR | Status: AC
  Filled 2013-07-20: qty 30

## 2013-07-20 MED ORDER — METOPROLOL TARTRATE 50 MG PO TABS
50.0000 mg | ORAL_TABLET | Freq: Once | ORAL | Status: AC
Start: 2013-07-20 — End: 2013-07-20
  Administered 2013-07-20: 50 mg via ORAL
  Filled 2013-07-20: qty 1

## 2013-07-20 SURGICAL SUPPLY — 69 items
ADH SKN CLS APL DERMABOND .7 (GAUZE/BANDAGES/DRESSINGS) ×6
ARMBAND PINK RESTRICT EXTREMIT (MISCELLANEOUS) ×4 IMPLANT
BAG DECANTER FOR FLEXI CONT (MISCELLANEOUS) ×4 IMPLANT
CANISTER SUCTION 2500CC (MISCELLANEOUS) ×4 IMPLANT
CATH CANNON HEMO 15F 50CM (CATHETERS) IMPLANT
CATH CANNON HEMO 15FR 19 (HEMODIALYSIS SUPPLIES) IMPLANT
CATH CANNON HEMO 15FR 23CM (HEMODIALYSIS SUPPLIES) ×3 IMPLANT
CATH CANNON HEMO 15FR 31CM (HEMODIALYSIS SUPPLIES) IMPLANT
CATH CANNON HEMO 15FR 32 (HEMODIALYSIS SUPPLIES) IMPLANT
CATH CANNON HEMO 15FR 32CM (HEMODIALYSIS SUPPLIES) IMPLANT
CATH STRAIGHT 5FR 65CM (CATHETERS) ×3 IMPLANT
CLIP TI MEDIUM 6 (CLIP) ×4 IMPLANT
CLIP TI WIDE RED SMALL 6 (CLIP) ×4 IMPLANT
COVER PROBE W GEL 5X96 (DRAPES) ×6 IMPLANT
COVER SURGICAL LIGHT HANDLE (MISCELLANEOUS) ×4 IMPLANT
DECANTER SPIKE VIAL GLASS SM (MISCELLANEOUS) ×4 IMPLANT
DERMABOND ADVANCED (GAUZE/BANDAGES/DRESSINGS) ×2
DERMABOND ADVANCED .7 DNX12 (GAUZE/BANDAGES/DRESSINGS) ×5 IMPLANT
DRAPE C-ARM 42X72 X-RAY (DRAPES) ×4 IMPLANT
DRAPE CHEST BREAST 15X10 FENES (DRAPES) ×4 IMPLANT
ELECT REM PT RETURN 9FT ADLT (ELECTROSURGICAL) ×4
ELECTRODE REM PT RTRN 9FT ADLT (ELECTROSURGICAL) ×3 IMPLANT
GAUZE SPONGE 2X2 8PLY STRL LF (GAUZE/BANDAGES/DRESSINGS) ×3 IMPLANT
GAUZE SPONGE 4X4 16PLY XRAY LF (GAUZE/BANDAGES/DRESSINGS) ×7 IMPLANT
GLOVE BIO SURGEON STRL SZ7 (GLOVE) ×7 IMPLANT
GLOVE BIOGEL PI IND STRL 6.5 (GLOVE) ×6 IMPLANT
GLOVE BIOGEL PI IND STRL 7.0 (GLOVE) ×2 IMPLANT
GLOVE BIOGEL PI IND STRL 7.5 (GLOVE) ×5 IMPLANT
GLOVE BIOGEL PI INDICATOR 6.5 (GLOVE) ×2
GLOVE BIOGEL PI INDICATOR 7.0 (GLOVE) ×2
GLOVE BIOGEL PI INDICATOR 7.5 (GLOVE) ×3
GLOVE SS BIOGEL STRL SZ 7 (GLOVE) ×2 IMPLANT
GLOVE SUPERSENSE BIOGEL SZ 7 (GLOVE) ×1
GLOVE SURG SS PI 6.5 STRL IVOR (GLOVE) ×3 IMPLANT
GLOVE SURG SS PI 7.5 STRL IVOR (GLOVE) ×3 IMPLANT
GOWN STRL NON-REIN LRG LVL3 (GOWN DISPOSABLE) ×18 IMPLANT
KIT BASIN OR (CUSTOM PROCEDURE TRAY) ×4 IMPLANT
KIT ROOM TURNOVER OR (KITS) ×4 IMPLANT
LOOP VESSEL MINI RED (MISCELLANEOUS) ×1 IMPLANT
NDL 18GX1X1/2 (RX/OR ONLY) (NEEDLE) ×3 IMPLANT
NDL HYPO 25GX1X1/2 BEV (NEEDLE) ×2 IMPLANT
NEEDLE 18GX1X1/2 (RX/OR ONLY) (NEEDLE) ×4 IMPLANT
NEEDLE HYPO 25GX1X1/2 BEV (NEEDLE) ×4 IMPLANT
NS IRRIG 1000ML POUR BTL (IV SOLUTION) ×4 IMPLANT
PACK CV ACCESS (CUSTOM PROCEDURE TRAY) ×4 IMPLANT
PACK SURGICAL SETUP 50X90 (CUSTOM PROCEDURE TRAY) IMPLANT
PAD ARMBOARD 7.5X6 YLW CONV (MISCELLANEOUS) ×8 IMPLANT
SET MICROPUNCTURE 5F STIFF (MISCELLANEOUS) IMPLANT
SOAP 2 % CHG 4 OZ (WOUND CARE) ×4 IMPLANT
SPONGE GAUZE 2X2 STER 10/PKG (GAUZE/BANDAGES/DRESSINGS) ×1
SPONGE SURGIFOAM ABS GEL 100 (HEMOSTASIS) IMPLANT
SUT ETHILON 3 0 PS 1 (SUTURE) ×4 IMPLANT
SUT MNCRL AB 4-0 PS2 18 (SUTURE) ×5 IMPLANT
SUT PROLENE 6 0 BV (SUTURE) ×2 IMPLANT
SUT PROLENE 7 0 BV 1 (SUTURE) ×6 IMPLANT
SUT VIC AB 3-0 SH 27 (SUTURE) ×4
SUT VIC AB 3-0 SH 27X BRD (SUTURE) ×3 IMPLANT
SYR 20CC LL (SYRINGE) ×5 IMPLANT
SYR 30ML LL (SYRINGE) IMPLANT
SYR 3ML LL SCALE MARK (SYRINGE) ×4 IMPLANT
SYR 5ML LL (SYRINGE) ×1 IMPLANT
SYR CONTROL 10ML LL (SYRINGE) ×2 IMPLANT
SYRINGE 10CC LL (SYRINGE) ×4 IMPLANT
TAPE CLOTH SURG 4X10 WHT LF (GAUZE/BANDAGES/DRESSINGS) ×2 IMPLANT
TOWEL OR 17X24 6PK STRL BLUE (TOWEL DISPOSABLE) ×4 IMPLANT
TOWEL OR 17X26 10 PK STRL BLUE (TOWEL DISPOSABLE) ×6 IMPLANT
UNDERPAD 30X30 INCONTINENT (UNDERPADS AND DIAPERS) ×4 IMPLANT
WATER STERILE IRR 1000ML POUR (IV SOLUTION) ×4 IMPLANT
WIRE AMPLATZ SS-J .035X180CM (WIRE) ×2 IMPLANT

## 2013-07-20 NOTE — Op Note (Signed)
OPERATIVE NOTE  PROCEDURE: 1.  Left tunneled dialysis catheter removal 2.  Right internal jugular vein tunneled dialysis catheter placement 3.  Right internal jugular vein cannulation under ultrasound guidance 4.  Left ulnar-cephalic arteriovenous fistula    PRE-OPERATIVE DIAGNOSIS: end-stage renal failure  POST-OPERATIVE DIAGNOSIS: same as above  SURGEON: Leonides Sake, MD  ANESTHESIA: local and MAC  ESTIMATED BLOOD LOSS: 50 cc  FINDING(S): 1.  Tips of the catheter in the right atrium on fluoroscopy 2.  No obvious pneumothorax on fluoroscopy 3.  Palpable thrill with dopplerable radial and ulnar signal at end of case  SPECIMEN(S):  none  INDICATIONS:   Timothy Banks is a 57 y.o. male who presents with end stage renal disease.  The patient presents for tunneled dialysis catheter placement and left brachiocephalic arteriovenous fistula placement.  The patient had symptomatic right arm steal, so I counseled the patient that he was at increased risk for left arm steal.  The patient is aware the risks of tunneled dialysis catheter placement include but are not limited to: bleeding, infection, central venous injury, pneumothorax, possible venous stenosis, possible malpositioning in the venous system, and possible infections related to long-term catheter presence.  The patient was aware of these risks and agreed to proceed.  DESCRIPTION: After written full informed consent was obtained from the patient, the patient was taken back to the operating room.  Prior to induction, the patient was given IV antibiotics.  After obtaining adequate sedation, the patient was prepped and draped in the standard fashion for a chest or neck tunneled dialysis catheter placement.  I injected local anesthetic at the left internal jugular vein tunneled dialysis catheter exit site and dissected out the cuff.  I pulled out the tunneled dialysis catheter and held pressure to the left neck exit site for 5 minutes.    I  then turned my attention to the right neck.  I anesthesized the neck cannulation site with local anesthetic.  Under ultrasound guidance, the right internal jugular vein was cannulated with the 18 gauge needle.  A J-wire was then placed down in the inferior vena cava under fluroscopic guidance.  The wire was then secured in place with a clamp to the drapes.  The cannulation site, the catheter exit site, and tract for the subcutaneous tunnel were then anesthesized with a total of 40 cc of a 1:1 mixture of 0.5% Marcaine without epinepherine and 1% Lidocaine with epinepherine.  I then made stab incisions at the neck and exit sites.   I dissected from the exit site to the cannulation site with a metal tunneler.   The subcutaneous tunnel was dilated by passing a plastic dilator over the metal dissector. The wire was then unclamped and I removed the needle.  The skin tract and venotomy was dilated serially with dilators.  Finally, the dilator-sheath was placed under fluroscopic guidance into the superior vena cava.  The dilator and wire were removed.  A 23 cm Diatek catheter was placed under fluoroscopic guidance down into the right atrium.  The sheath was broken and peeled away while holding the catheter cuff at the level of the skin.  The back end of this catheter was transected, revealing the two lumens of this catheter.  The ports were docked onto these two lumens.  The catheter hub was then screwed into place.  Each port was tested by aspirating and flushing.  No resistance was noted.  Each port was then thoroughly flushed with heparinized saline.  The catheter was  secured in placed with two interrupted stitches of 3-0 Nylon tied to the catheter.  The neck incision was closed with a U-stitch of 4-0 Monocryl.  The neck and chest incision were cleaned and sterile bandages applied.  Each port was then loaded with concentrated heparin (1000 Units/mL) at the manufacturer recommended volumes to each port.  Sterile caps were  applied to each port.  On completion fluoroscopy, the tips of the catheter were in the right atrium, and there was no evidence of pneumothorax.   The drapes were taken down and the patient was cleaned off.  The patient was then reprepped and redraped in the standard fashion for a left arm access procedure.  I turned my attention first to identifying the patient's cephalic vein and brachial artery.  Using SonoSite guidance, the location of these vessels were marked out on the skin.   At this point, I injected local anesthetic to obtain a field block of the antecubitum.  In total, I injected about 10 mL of a 1:1 mixture of 0.5% Marcaine without epinephrine and 1% lidocaine with epinephrine.  I made a transverse incision at the level of the antecubitum and dissected through the subcutaneous tissue and fascia to gain exposure of the brachial artery.  This was noted to be 3.5 mm in diameter externally.  With some additional dissection I found the brachial bifurcation slight distal to the brachial artery.  I dissected out the ulnar and radial arteries.  The radial artery appeared to be smaller, 2.5 mm.  The ulnar artery appeared to be soft and 3 mm in diameter externally.  i planned on placing the anastomosis on this artery to decrease the risk of steal.  This was dissected out proximally and distally and controlled with vessel loops .  I then dissected out the cephalic vein.  This was noted to be 3.5 mm in diameter externally.  The distal segment of the vein was ligated with a  2-0 silk, and the vein was transected.  The proximal segment was iinterrogated with serial dilators.  The vein accepted up to a 4 mm dilator without any difficulty.  I then instilled the heparinized saline into the vein and clamped it.  At this point, I reset my exposure of the ulnar artery and placed the artery under tension proximally and distally.  I made an arteriotomy with a #11 blade, and then I extended the arteriotomy with a Potts  scissor.  I injected heparinized saline proximal and distal to this arteriotomy.  The vein was then sewn to the artery in an end-to-side configuration with a running stitch of 7-0 Prolene.  Prior to completing this anastomosis, I allowed the vein and artery to backbleed.  There was no evidence of clot from any vessels.  I completed the anastomosis in the usual fashion and then released all vessel loops and clamps.  There was a palpable  thrill in the venous outflow, and there was a dopplerable radial and ulnar pulse.  At this point, I irrigated out the surgical wound.  There was no further active bleeding.  The subcutaneous tissue was reapproximated with a running stitch of 3-0 Vicryl.  The skin was then reapproximated with a running subcuticular stitch of 4-0 Vicryl.  The skin was then cleaned, dried, and reinforced with Dermabond.  The patient tolerated this procedure well.   COMPLICATIONS: none  CONDITION: stable  Leonides Sake, MD [facility] [place] Office: [phone] Pager: [phone]  07/20/2013, 10:01 AM

## 2013-07-20 NOTE — Preoperative (Signed)
Beta Blockers   Reason not to administer Beta Blockers:Not Applicable 

## 2013-07-20 NOTE — Telephone Encounter (Signed)
Message copied by Jena Gauss on Mon Jul 20, 2013  4:42 PM ------      Message from: Melene Plan      Created: Mon Jul 20, 2013 11:50 AM                   ----- Message -----         From: Fransisco Hertz, MD         Sent: 07/20/2013  10:09 AM           To: Reuel Derby, Melene Plan, RN            [name]      [id number]      01-11-1956            PROCEDURE:      1.  Left tunneled dialysis catheter removal      2.  Right internal jugular vein tunneled dialysis catheter placement      3.  Right internal jugular vein cannulation under ultrasound guidance      4.  Left ulnar-cephalic arteriovenous fistula              Asst: Lianne Cure, PAC             Follow-up: 4 weeks ------

## 2013-07-20 NOTE — Anesthesia Postprocedure Evaluation (Signed)
Anesthesia Post Note  Patient: Timothy Banks  Procedure(s) Performed: Procedure(s) (LRB): REMOVAL OF A DIALYSIS CATHETER (Left) INSERTION OF DIALYSIS CATHETER, Right Internal Jugular (Right) ARTERIOVENOUS (AV) FISTULA CREATION (Left)  Anesthesia type: general  Patient location: PACU  Post pain: Pain level controlled  Post assessment: Patient's Cardiovascular Status Stable  Last Vitals:  Filed Vitals:   07/20/13 1020  BP: 139/61  Pulse: 61  Temp:   Resp: 13    Post vital signs: Reviewed and stable  Level of consciousness: sedated  Complications: No apparent anesthesia complications

## 2013-07-20 NOTE — Interval H&P Note (Signed)
[facility] [place]  History and Physical Update  The patient was interviewed and re-examined.  The patient's previous History and Physical has been reviewed and is unchanged.  There is no change in the plan of care: L BC AVF, placement of new TDC.  Leonides Sake, MD [facility] [place] Office: [phone] Pager: [phone]  07/20/2013, 7:21 AM  \

## 2013-07-20 NOTE — Progress Notes (Signed)
Claud Kelp PA informed of pt procedure today and K level.  Instructed to have pt return to HD center at usual time tomorrow.

## 2013-07-20 NOTE — Transfer of Care (Signed)
Immediate Anesthesia Transfer of Care Note  Patient: KHAMERON GRUENWALD  Procedure(s) Performed: Procedure(s): REMOVAL OF A DIALYSIS CATHETER (Left) INSERTION OF DIALYSIS CATHETER, Right Internal Jugular (Right) ARTERIOVENOUS (AV) FISTULA CREATION (Left)  Patient Location: PACU  Anesthesia Type:MAC  Level of Consciousness: awake, alert  and oriented  Airway & Oxygen Therapy: Patient Spontanous Breathing  Post-op Assessment: Report given to PACU RN and Post -op Vital signs reviewed and stable  Post vital signs: Reviewed and stable  Complications: No apparent anesthesia complications

## 2013-07-20 NOTE — Anesthesia Preprocedure Evaluation (Addendum)
Anesthesia Evaluation  Patient identified by MRN, date of birth, ID band Patient awake    Reviewed: Allergy & Precautions, H&P , NPO status , Patient's Chart, lab work & pertinent test results, reviewed documented beta blocker date and time   History of Anesthesia Complications Negative for: history of anesthetic complications  Airway Mallampati: I TM Distance: >3 FB Neck ROM: Full    Dental   Pulmonary asthma , Current Smoker,          Cardiovascular hypertension, Pt. on medications and Pt. on home beta blockers + angina + CAD, + Past MI, + Peripheral Vascular Disease and +CHF  Echo EF 30-35%   Neuro/Psych    GI/Hepatic   Endo/Other  diabetes, Insulin Dependent  Renal/GU DialysisRenal disease     Musculoskeletal   Abdominal   Peds  Hematology   Anesthesia Other Findings   Reproductive/Obstetrics                         Anesthesia Physical Anesthesia Plan  ASA: III  Anesthesia Plan: MAC   Post-op Pain Management:    Induction: Intravenous  Airway Management Planned: Simple Face Mask  Additional Equipment:   Intra-op Plan:   Post-operative Plan:   Informed Consent: I have reviewed the patients History and Physical, chart, labs and discussed the procedure including the risks, benefits and alternatives for the proposed anesthesia with the patient or authorized representative who has indicated his/her understanding and acceptance.     Plan Discussed with: CRNA and Surgeon  Anesthesia Plan Comments:         Anesthesia Quick Evaluation

## 2013-07-20 NOTE — H&P (View-Only) (Signed)
[facility] [place] H&P NOTE  MRN : [id number]  CC: ESRD Dialysis T,Th,Sat [facility] - Dr. Lowell Guitar  History of Present Illness: Timothy Banks is a 57 y.o. male Who had a right Cimino fistula placed 02/25/13.  He developed steal with numbness and weakness in the right hand and had ligation of fistula in June 2014 by Dr. Imogene Burn. He has had both Right and Left IJ catheters which the pt states have not worked well. His left IJ catheter was placed by Dr. Arbie Cookey on 06/10/2013.  He states his symptoms in the right hand have not improved. Dr Imogene Burn had referred pt to a hand surgeon for evaluation but pt does not want to do that at this time.  He is LHD.  He had a chain saw accident in the left forearm  Current Outpatient Prescriptions  Medication Sig Dispense Refill  . albuterol (PROVENTIL HFA;VENTOLIN HFA) 108 (90 BASE) MCG/ACT inhaler Inhale 2 puffs into the lungs every 6 (six) hours as needed for wheezing or shortness of breath.      . ALPRAZolam (XANAX) 0.5 MG tablet Take 0.5 mg by mouth at bedtime.      Marland Kitchen amLODipine (NORVASC) 10 MG tablet Take 10 mg by mouth daily.      Marland Kitchen buPROPion (WELLBUTRIN XL) 300 MG 24 hr tablet Take 300 mg by mouth every morning.      . furosemide (LASIX) 80 MG tablet Take 2 tablets (160 mg total) by mouth 2 (two) times daily.  60 tablet  0  . glipiZIDE (GLUCOTROL) 10 MG tablet Take 20 mg by mouth 2 (two) times daily before a meal.      . hydrALAZINE (APRESOLINE) 50 MG tablet Take 50-100 mg by mouth 2 (two) times daily. Takes 100mg  in the morning & 50mg  at night      . insulin glargine (LANTUS) 100 UNIT/ML injection Inject 30 Units into the skin at bedtime.      . isosorbide mononitrate (IMDUR) 60 MG 24 hr tablet Take 30 mg by mouth daily.      . metoprolol (LOPRESSOR) 50 MG tablet Take 1 tablet (50 mg total) by mouth 2 (two) times daily.  60 tablet  0  . nitroGLYCERIN (NITROSTAT) 0.4 MG SL tablet Place 0.4 mg under the tongue every 5 (five) minutes as  needed for chest pain. x3 doses as needed for chest pain      . oxyCODONE (ROXICODONE) 15 MG immediate release tablet Take 1 tablet (15 mg total) by mouth every 8 (eight) hours as needed for pain. For pain  30 tablet  0  . simvastatin (ZOCOR) 20 MG tablet Take 20 mg by mouth every evening.       No current facility-administered medications for this visit.    Pt meds include: Statin :No Betablocker: Yes ASA: No Other anticoagulants/antiplatelets: none  Past Medical History  Diagnosis Date  . Myocardial infarction   . Diabetes mellitus without complication   . CHF (congestive heart failure)   . Asthma   . Hyperlipidemia   . Hypertension   . Chronic kidney disease 02/17/2013    ACUTE RENAL  . Coronary artery disease   . Anginal pain   . Neuromuscular disorder     neuropathy in hands and feet    Past Surgical History  Procedure Laterality Date  . Right carotid endarterectomy  2002    right side  . Coronary artery bypass graft  2002  . Av fistula placement Right 02/25/2013  Procedure: ARTERIOVENOUS (AV) FISTULA CREATION;  Surgeon: Sherren Kerns, MD;  Location: [facility] OR;  Service: Vascular;  Laterality: Right;  Right Radiocephalic AVF  . Insertion of dialysis catheter Right 03/12/2013    Procedure: ultrasound guided INSERTION OF DIALYSIS CATHETER right internal jugular using arrow 23cm;  Surgeon: Chuck Hint, MD;  Location: [facility] OR;  Service: Vascular;  Laterality: Right;  . Ligation of arteriovenous  fistula Right 04/27/2013    Procedure: LIGATION OF RIGHT RADIAL CEPHALIC ARTERIOVENOUS  FISTULA;  Surgeon: Fransisco Hertz, MD;  Location: [facility] OR;  Service: Vascular;  Laterality: Right;  . Eye surgery      cataract surgery both eyes  . Removal of a dialysis catheter Right 06/10/2013    Procedure: REMOVAL OF RIGHT INTERNAL JUGULAR TUNNELED DIALYSIS CATHETER;  Surgeon: Larina Earthly, MD;  Location: [facility] OR;  Service: Vascular;  Laterality: Right;  . Insertion of dialysis catheter Left  06/10/2013    Procedure: INSERTION OF LEFT INTERNAL JUGULAR TUNNELED DIALYSIS CATHETER;  Surgeon: Larina Earthly, MD;  Location: [facility] OR;  Service: Vascular;  Laterality: Left;    Social History History  Substance Use Topics  . Smoking status: Former Smoker -- 1.00 packs/day for 25 years    Start date: 11/05/1973    Quit date: 06/09/2003  . Smokeless tobacco: Never Used     Comment: quit for 10 years, started back 9 mo ago  . Alcohol Use: No    Family History Family History  Problem Relation Age of Onset  . Diabetes Mother   . Heart disease Mother   . Diabetes Father   . Heart disease Father     No Known Allergies   REVIEW OF SYSTEMS  General: [ ]  Weight loss, [ ]  Fever, [ ]  chills Neurologic: [ ]  Dizziness, [ ]  Blackouts, [ ]  Seizure [ ]  Stroke, [ ]  "Mini stroke", [ ]  Slurred speech, [ ]  Temporary blindness; [ ]  weakness in arms or legs, [ ]  Hoarseness [ ]  Dysphagia Cardiac: [ ]  Chest pain/pressure, [ ]  Shortness of breath at rest [ ]  Shortness of breath with exertion, [ ]  Atrial fibrillation or irregular heartbeat  Vascular: [x ] Pain in legs with walking, [ ]  Pain in legs at rest, [ ]  Pain in legs at night,  [ ]  Non-healing ulcer, [ ]  Blood clot in vein/DVT,   Pulmonary: [ ]  Home oxygen, [ ]  Productive cough, [ ]  Coughing up blood, [ ]  Asthma,  [ ]  Wheezing [ ]  COPD Musculoskeletal:  [ ]  Arthritis, [ ]  Low back pain, [ ]  Joint pain Hematologic: [ ]  Easy Bruising, [ ]  Anemia; [ ]  Hepatitis Gastrointestinal: [ ]  Blood in stool, [ ]  Gastroesophageal Reflux/heartburn, Urinary: [x ] chronic Kidney disease, [ ]  on HD - [ ]  MWF or [x ] TTHS, [ ]  Burning with urination, [ ]  Difficulty urinating Skin: [ ]  Rashes, [ ]  Wounds Psychological: [ ]  Anxiety, [ ]  Depression  Physical Examination Filed Vitals:   07/09/13 1419  BP: 149/66  Pulse: 88  Height: 5\' 7"  (1.702 m)  Weight: 328 lb (148.78 kg)  SpO2: 95%   Body mass index is 51.36 kg/(m^2).  General: Obese WDWN male in  NAD Gait: Normal HENT: WNL Eyes: Pupils equal Pulmonary: normal non-labored breathing , with Rales at bases, rhonchi,  wheezing Cardiac: RRR, without  Murmurs, Skin: no rashes, ulcers noted;  no Gangrene , no cellulitis; no open wounds;  Vascular Exam/Pulses: 2+ radial pulses bilat No thrill or  bruit in right cimino fistula Long scar on left forarm well healed Musculoskeletal: positive muscle wasting 1st web space right hand; no edema  Neurologic: A&O X 3; Appropriate Affect ;  SENSATION: normal; MOTOR FUNCTION: 5/5 Symmetric except 4/5 grip on right Speech is fluent/normal   Significant Diagnostic Studies: CBC Lab Results  Component Value Date   WBC 12.9* 03/14/2013   HGB 13.6 06/10/2013   HCT 40.0 06/10/2013   MCV 88.1 03/14/2013   PLT 223 03/14/2013    BMET    Component Value Date/Time   NA 134* 06/10/2013 0850   K 3.7 06/10/2013 0850   CL 94* 03/14/2013 0700   CO2 33* 03/14/2013 0700   GLUCOSE 245* 06/10/2013 0850   BUN 42* 03/14/2013 0700   CREATININE 2.69* 03/14/2013 0700   CALCIUM 8.6 03/14/2013 0700   GFRNONAA 25* 03/14/2013 0700   GFRAA 29* 03/14/2013 0700   Estimated Creatinine Clearance: 43 ml/min (by C-G formula based on Cr of 2.69).  COAG No results found for this basename: INR, PROTIME     Non-Invasive Vascular Imaging: Vein mapping shows adequate cephalic  And basilic vein on left > 3mm  ASSESSMENT/PLAN: left brachiocephalic AVF and placement new diatek catheter on 07/20/13  Right hand steal syndrome - again offered hand eval - pt would like to do strengthening exercises on his on  Poss PVD. Pt did not C/O leg pain today. We will get ABI's and arterial duplex of BLE at F/U visit as there is a note from Dr. Jerald Kief re leg pain   Timothy Banks,Timothy Banks 07/09/2013 2:47 PM    History and exam details as above. The patient previously had complaints of pain in his right hand. This has not improved significantly post ligation of his fistula. However he does not wish referral  for a hand evaluation. Vein mapping shows his best option for the left upper extremity with a brachiocephalic fistula. I did discuss with the patient that he would be at risk of steal. He would also be at risk for non-maturation of the fistula. He understands and agrees to proceed. We will also replace his dialysis catheter at the same time as this is functioning poorly. Procedure is scheduled for September 15. He will have ABIs and arterial duplex of both lower extremities at his followup visit for his fistula to evaluate leg pain.  Fabienne Bruns, MD [facility] [place] Office: [phone] Pager: [phone]

## 2013-07-20 NOTE — OR Nursing (Signed)
Removal of Left Dialysis Catheter and Placement of Right Dialysis Catheter end @0832 .

## 2013-07-21 ENCOUNTER — Encounter (HOSPITAL_COMMUNITY): Payer: Self-pay | Admitting: Vascular Surgery

## 2013-08-20 ENCOUNTER — Encounter: Payer: Self-pay | Admitting: Vascular Surgery

## 2013-08-21 ENCOUNTER — Ambulatory Visit (INDEPENDENT_AMBULATORY_CARE_PROVIDER_SITE_OTHER): Payer: Self-pay | Admitting: Vascular Surgery

## 2013-08-21 ENCOUNTER — Encounter: Payer: Self-pay | Admitting: Vascular Surgery

## 2013-08-21 VITALS — BP 197/49 | HR 74 | Ht 67.0 in | Wt 334.3 lb

## 2013-08-21 DIAGNOSIS — N186 End stage renal disease: Secondary | ICD-10-CM

## 2013-08-21 DIAGNOSIS — Z4931 Encounter for adequacy testing for hemodialysis: Secondary | ICD-10-CM

## 2013-08-21 NOTE — Progress Notes (Signed)
[facility] [place]  Postoperative Access Visit  History of Present Illness  Timothy Banks is a 57 y.o. year old male who presents for postoperative follow-up for: RIJ TDC, L upper arm ulnocephalic AVF (Date: 07/20/13).  The patient's wounds are healed.  The patient notes no steal symptoms.  The patient is able to complete their activities of daily living.  The patient's current symptoms are: none.  For VQI Use Only  PRE-ADM LIVING: Home  AMB STATUS: Ambulatory  Physical Examination Filed Vitals:   08/21/13 1303  BP: 197/49  Pulse: 74    LUE: Incision is healed, skin feels warm, hand grip is 5/5, sensation in digits is intact, palpable thrill, bruit can be auscultated , on sonosite: distal half is >6 mm, proximal half 4-5 mm, side branches evident  Medical Decision Making  Timothy Banks is a 57 y.o. year old male who presents s/p L upper arm UC AVF.  Suspect, the patient's access will be ready for use in 4 weeks.  Pt will follow up in 4 weeks for formal access duplex  Thank you for allowing [place] to participate in this patient's care.  Leonides Sake, MD [facility] [place] Office: [phone] Pager: [phone]  08/21/2013, 3:29 PM

## 2013-08-21 NOTE — Addendum Note (Signed)
Addended by: Lorin Mercy K on: 08/21/2013 04:00 PM   Modules accepted: Orders

## 2013-09-13 IMAGING — CR DG CHEST 1V PORT
2 series · 2 of 2 positions shown · non-contrast
Comparison: Single view of the chest 03/11/2013.

CLINICAL DATA: Status post dialysis catheter placement.

PORTABLE CHEST - 1 VIEW

[AP (1 of 2)]
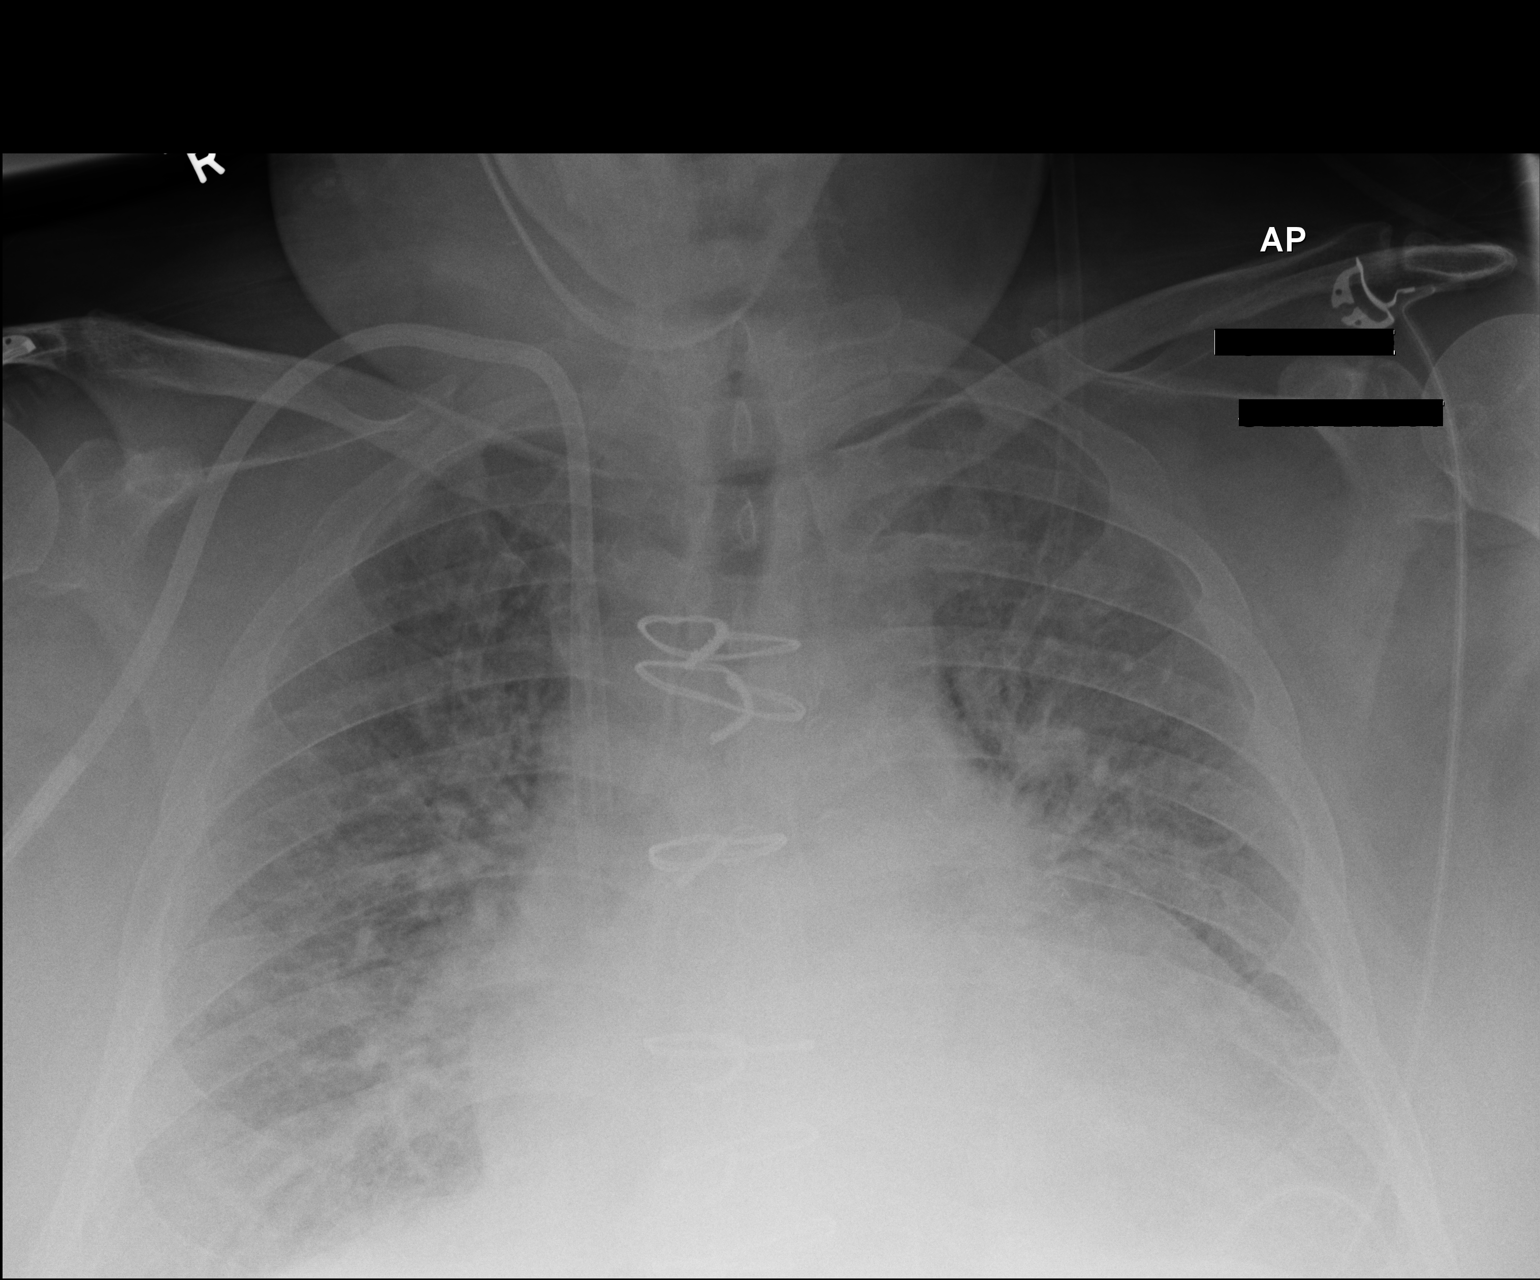

[AP (2 of 2)]
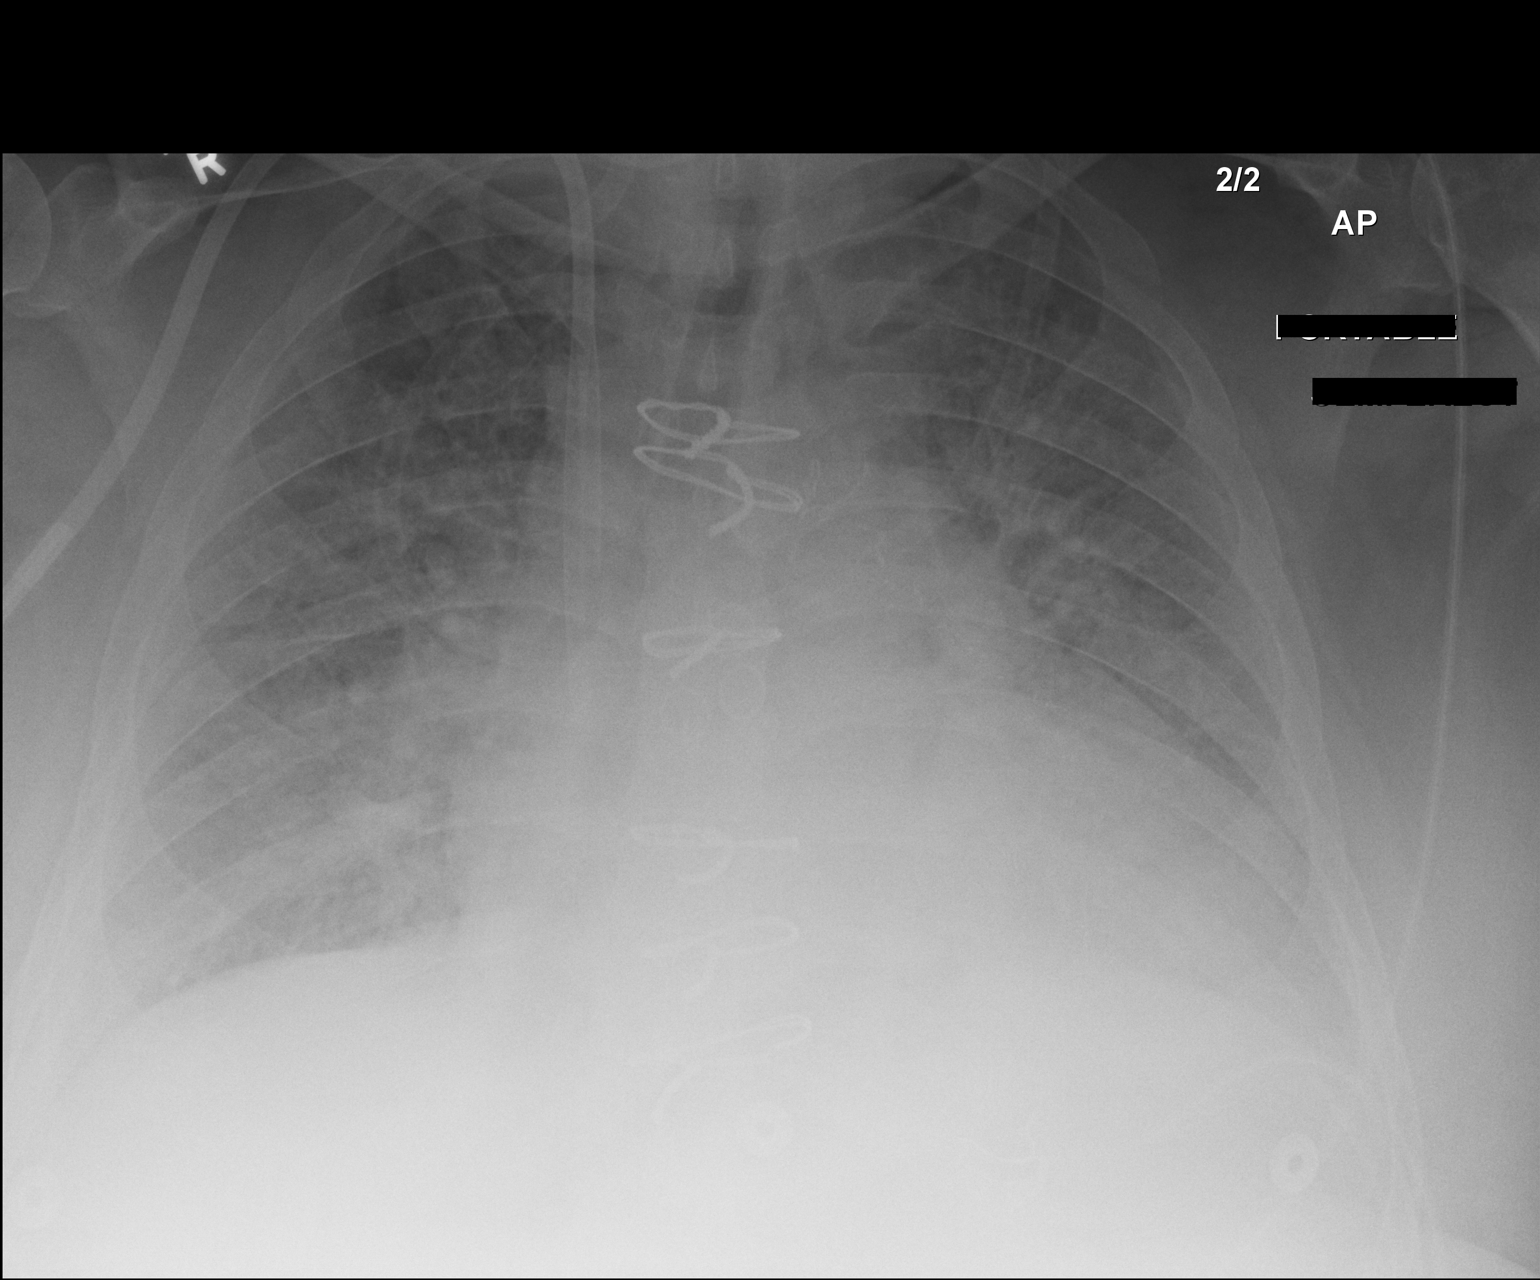

[2 of 2 positions shown; findings below may reference images not displayed]

FINDINGS: Double lumen right IJ approach dialysis catheter is in
place.  Tip of the proximal lumen is in the superior vena cava with
the distal lumen projecting at the superior cavoatrial junction.
There is no pneumothorax.  Cardiomegaly and pulmonary edema are
noted.
IMPRESSION: 1.  Dialysis catheter in place without evidence of complication.
2.  Pulmonary edema.

## 2013-09-24 ENCOUNTER — Encounter: Payer: Self-pay | Admitting: Vascular Surgery

## 2013-09-25 ENCOUNTER — Encounter (INDEPENDENT_AMBULATORY_CARE_PROVIDER_SITE_OTHER): Payer: Self-pay

## 2013-09-25 ENCOUNTER — Encounter: Payer: Self-pay | Admitting: Vascular Surgery

## 2013-09-25 ENCOUNTER — Ambulatory Visit (INDEPENDENT_AMBULATORY_CARE_PROVIDER_SITE_OTHER): Payer: Self-pay | Admitting: Vascular Surgery

## 2013-09-25 ENCOUNTER — Ambulatory Visit (HOSPITAL_COMMUNITY)
Admission: RE | Admit: 2013-09-25 | Discharge: 2013-09-25 | Disposition: A | Discharge status: Home / Self Care | Payer: Medicare Other | Source: Ambulatory Visit | Attending: Vascular Surgery | Admitting: Vascular Surgery

## 2013-09-25 ENCOUNTER — Other Ambulatory Visit: Payer: Self-pay | Admitting: *Deleted

## 2013-09-25 VITALS — BP 147/55 | HR 71 | Ht 67.0 in | Wt 334.0 lb

## 2013-09-25 DIAGNOSIS — Z01818 Encounter for other preprocedural examination: Secondary | ICD-10-CM

## 2013-09-25 DIAGNOSIS — Z4931 Encounter for adequacy testing for hemodialysis: Secondary | ICD-10-CM | POA: Insufficient documentation

## 2013-09-25 DIAGNOSIS — N186 End stage renal disease: Secondary | ICD-10-CM

## 2013-09-25 NOTE — Progress Notes (Signed)
[facility] [place]  Postoperative Access Visit  History of Present Illness  Timothy Banks is a 57 y.o. year old male who presents for postoperative follow-up for: L upper arm ulnar-cephalic AVF (Date: 07/20/13).  The patient's wounds are healed.  The patient notes no steal symptoms.  The patient is able to complete their activities of daily living.  The patient's current symptoms are: none.  Pt's RIJ TDC has not been functioning well.  Past Medical History  Diagnosis Date  . Myocardial infarction   . Diabetes mellitus without complication   . CHF (congestive heart failure)   . Asthma   . Hyperlipidemia   . Hypertension   . Chronic kidney disease 02/17/2013    ACUTE RENAL  . Coronary artery disease   . Anginal pain   . Neuromuscular disorder     neuropathy in hands and feet    Past Surgical History  Procedure Laterality Date  . Right carotid endarterectomy  2002    right side  . Coronary artery bypass graft  2002  . Av fistula placement Right 02/25/2013    Procedure: ARTERIOVENOUS (AV) FISTULA CREATION;  Surgeon: Sherren Kerns, MD;  Location: [facility] OR;  Service: Vascular;  Laterality: Right;  Right Radiocephalic AVF  . Insertion of dialysis catheter Right 03/12/2013    Procedure: ultrasound guided INSERTION OF DIALYSIS CATHETER right internal jugular using arrow 23cm;  Surgeon: Chuck Hint, MD;  Location: [facility] OR;  Service: Vascular;  Laterality: Right;  . Ligation of arteriovenous  fistula Right 04/27/2013    Procedure: LIGATION OF RIGHT RADIAL CEPHALIC ARTERIOVENOUS  FISTULA;  Surgeon: Fransisco Hertz, MD;  Location: [facility] OR;  Service: Vascular;  Laterality: Right;  . Eye surgery      cataract surgery both eyes  . Removal of a dialysis catheter Right 06/10/2013    Procedure: REMOVAL OF RIGHT INTERNAL JUGULAR TUNNELED DIALYSIS CATHETER;  Surgeon: Larina Earthly, MD;  Location: [facility] OR;  Service: Vascular;  Laterality: Right;  . Insertion of dialysis catheter Left  06/10/2013    Procedure: INSERTION OF LEFT INTERNAL JUGULAR TUNNELED DIALYSIS CATHETER;  Surgeon: Larina Earthly, MD;  Location: [facility] OR;  Service: Vascular;  Laterality: Left;  . Removal of a dialysis catheter Left 07/20/2013    Procedure: REMOVAL OF A DIALYSIS CATHETER;  Surgeon: Fransisco Hertz, MD;  Location: [facility] OR;  Service: Vascular;  Laterality: Left;  . Insertion of dialysis catheter Right 07/20/2013    Procedure: INSERTION OF DIALYSIS CATHETER, Right Internal Jugular;  Surgeon: Fransisco Hertz, MD;  Location: [facility] OR;  Service: Vascular;  Laterality: Right;  . Av fistula placement Left 07/20/2013    Procedure: ARTERIOVENOUS (AV) FISTULA CREATION;  Surgeon: Fransisco Hertz, MD;  Location: [facility] OR;  Service: Vascular;  Laterality: Left;    History   Social History  . Marital Status: Married    Spouse Name: N/A    Number of Children: N/A  . Years of Education: N/A   Occupational History  . Not on file.   Social History Main Topics  . Smoking status: Current Every Day Smoker -- 0.50 packs/day for 26 years    Types: Cigarettes    Start date: 11/05/1973    Last Attempt to Quit: 06/09/2003  . Smokeless tobacco: Never Used     Comment: quit for 10 years, started back 9 mo ago  . Alcohol Use: No  . Drug Use: No  . Sexual Activity: Not on file  Other Topics Concern  . Not on file   Social History Narrative  . No narrative on file    Family History  Problem Relation Age of Onset  . Diabetes Mother   . Heart disease Mother   . Diabetes Father   . Heart disease Father     Current Outpatient Prescriptions on File Prior to Visit  Medication Sig Dispense Refill  . albuterol (PROVENTIL HFA;VENTOLIN HFA) 108 (90 BASE) MCG/ACT inhaler Inhale 2 puffs into the lungs every 6 (six) hours as needed for wheezing or shortness of breath.      . ALPRAZolam (XANAX) 0.5 MG tablet Take 0.5 mg by mouth at bedtime.      Marland Kitchen amLODipine (NORVASC) 10 MG tablet Take 10 mg by mouth daily.      Marland Kitchen buPROPion  (WELLBUTRIN XL) 300 MG 24 hr tablet Take 300 mg by mouth every morning.      . furosemide (LASIX) 80 MG tablet Take 2 tablets (160 mg total) by mouth 2 (two) times daily.  60 tablet  0  . glipiZIDE (GLUCOTROL) 10 MG tablet Take 20 mg by mouth 2 (two) times daily before a meal.      . hydrALAZINE (APRESOLINE) 50 MG tablet Take 50-100 mg by mouth 2 (two) times daily. Takes 100mg  in the morning & 50mg  at night      . insulin glargine (LANTUS) 100 UNIT/ML injection Inject 30 Units into the skin at bedtime.      . isosorbide mononitrate (IMDUR) 60 MG 24 hr tablet Take 30 mg by mouth daily.      . metoprolol (LOPRESSOR) 50 MG tablet Take 1 tablet (50 mg total) by mouth 2 (two) times daily.  60 tablet  0  . nitroGLYCERIN (NITROSTAT) 0.4 MG SL tablet Place 0.4 mg under the tongue every 5 (five) minutes as needed for chest pain. x3 doses as needed for chest pain      . oxyCODONE (ROXICODONE) 15 MG immediate release tablet Take 15 mg by mouth every 6 (six) hours as needed for pain. For pain      . oxyCODONE-acetaminophen (PERCOCET/ROXICET) 5-325 MG per tablet Take 1 tablet by mouth every 6 (six) hours as needed for pain.  30 tablet  0  . simvastatin (ZOCOR) 20 MG tablet Take 20 mg by mouth every evening.       No current facility-administered medications on file prior to visit.    No Known Allergies  REVIEW OF SYSTEMS:  (Positives checked otherwise negative)  CARDIOVASCULAR:  []  chest pain, []  chest pressure, []  palpitations, []  shortness of breath when laying flat, []  shortness of breath with exertion,  []  pain in feet when walking, []  pain in feet when laying flat, []  history of blood clot in veins (DVT), []  history of phlebitis, []  swelling in legs, []  varicose veins  PULMONARY:  []  productive cough, []  asthma, []  wheezing  NEUROLOGIC:  []  weakness in arms or legs, []  numbness in arms or legs, []  difficulty speaking or slurred speech, []  temporary loss of vision in one eye, []   dizziness  HEMATOLOGIC:  []  bleeding problems, []  problems with blood clotting too easily  MUSCULOSKEL:  []  joint pain, []  joint swelling  GASTROINTEST:  []  vomiting blood, []  blood in stool     GENITOURINARY:  []  burning with urination, []  blood in urine, [x]  ESRD-HD: T-R-S  PSYCHIATRIC:  []  history of major depression  INTEGUMENTARY:  []  rashes, []  ulcers   For VQI Use Only  PRE-ADM LIVING: Home  AMB STATUS: Ambulatory  Physical Examination Filed Vitals:   09/25/13 1419  BP: 147/55  Pulse: 71    RUE: Incision is healed, skin feels warm, hand grip is 5/5, sensation in digits is intact, palpable thrill, bruit can be auscultated with normal sound distally and wheezy sound in mid-arm  Pulmonary: Sym exp, good air movt, CTAB, no rales, rhonchi, & wheezing  Cardiac: RRR, Nl S1, S2, no Murmurs, rubs or gallops  L access duplex (09/25/2013)  Diameters: 6.3-11.3 mm  Depth: 2.0-8.4 mm  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 year old male who presents s/p L U-C AVF with frozen valves.  I am concerned the frozen valves in this fistula might compromise the function of the fistula.  I am arranging a L arm fistulogram with intervention with Dr. Edilia Bo on Monday 09/28/13  After successful intervention, I believe this fistula should be used for HD purpose as the [facility] is no functioning adequately.  The patient's tunneled dialysis catheter can be removed after two successful cannulations and completed dialysis treatments.  Thank you for allowing [place] to participate in this patient's care.  Leonides Sake, MD [facility] [place] Office: [phone] Pager: [phone]  09/25/2013, 2:46 PM

## 2013-09-28 ENCOUNTER — Observation Stay (HOSPITAL_COMMUNITY): Payer: Medicare Other

## 2013-09-28 ENCOUNTER — Encounter (HOSPITAL_COMMUNITY): Payer: Self-pay | Admitting: *Deleted

## 2013-09-28 ENCOUNTER — Encounter (HOSPITAL_COMMUNITY)
Admission: RE | Disposition: A | Discharge status: Home / Self Care | Payer: Self-pay | Source: Ambulatory Visit | Attending: Vascular Surgery

## 2013-09-28 ENCOUNTER — Inpatient Hospital Stay (HOSPITAL_COMMUNITY)
Admission: RE | Admit: 2013-09-28 | Discharge: 2013-09-30 | DRG: 252 | Disposition: A | Discharge status: Home / Self Care | Payer: Medicare Other | Source: Ambulatory Visit | Attending: Vascular Surgery | Admitting: Vascular Surgery

## 2013-09-28 DIAGNOSIS — N186 End stage renal disease: Secondary | ICD-10-CM

## 2013-09-28 DIAGNOSIS — Z992 Dependence on renal dialysis: Secondary | ICD-10-CM

## 2013-09-28 DIAGNOSIS — I2510 Atherosclerotic heart disease of native coronary artery without angina pectoris: Secondary | ICD-10-CM | POA: Diagnosis present

## 2013-09-28 DIAGNOSIS — I120 Hypertensive chronic kidney disease with stage 5 chronic kidney disease or end stage renal disease: Secondary | ICD-10-CM | POA: Diagnosis present

## 2013-09-28 DIAGNOSIS — Z6841 Body Mass Index (BMI) 40.0 and over, adult: Secondary | ICD-10-CM

## 2013-09-28 DIAGNOSIS — E119 Type 2 diabetes mellitus without complications: Secondary | ICD-10-CM | POA: Diagnosis present

## 2013-09-28 DIAGNOSIS — Z951 Presence of aortocoronary bypass graft: Secondary | ICD-10-CM

## 2013-09-28 DIAGNOSIS — T82598A Other mechanical complication of other cardiac and vascular devices and implants, initial encounter: Secondary | ICD-10-CM | POA: Diagnosis present

## 2013-09-28 DIAGNOSIS — E8770 Fluid overload, unspecified: Secondary | ICD-10-CM

## 2013-09-28 DIAGNOSIS — E785 Hyperlipidemia, unspecified: Secondary | ICD-10-CM | POA: Diagnosis present

## 2013-09-28 DIAGNOSIS — M899 Disorder of bone, unspecified: Secondary | ICD-10-CM | POA: Diagnosis present

## 2013-09-28 DIAGNOSIS — Z833 Family history of diabetes mellitus: Secondary | ICD-10-CM

## 2013-09-28 DIAGNOSIS — Z794 Long term (current) use of insulin: Secondary | ICD-10-CM

## 2013-09-28 DIAGNOSIS — Z8249 Family history of ischemic heart disease and other diseases of the circulatory system: Secondary | ICD-10-CM

## 2013-09-28 DIAGNOSIS — I509 Heart failure, unspecified: Principal | ICD-10-CM | POA: Diagnosis present

## 2013-09-28 DIAGNOSIS — F17200 Nicotine dependence, unspecified, uncomplicated: Secondary | ICD-10-CM | POA: Diagnosis present

## 2013-09-28 DIAGNOSIS — G5790 Unspecified mononeuropathy of unspecified lower limb: Secondary | ICD-10-CM | POA: Diagnosis present

## 2013-09-28 DIAGNOSIS — I252 Old myocardial infarction: Secondary | ICD-10-CM

## 2013-09-28 DIAGNOSIS — N2581 Secondary hyperparathyroidism of renal origin: Secondary | ICD-10-CM | POA: Diagnosis present

## 2013-09-28 DIAGNOSIS — E876 Hypokalemia: Secondary | ICD-10-CM | POA: Diagnosis present

## 2013-09-28 DIAGNOSIS — Z79899 Other long term (current) drug therapy: Secondary | ICD-10-CM

## 2013-09-28 DIAGNOSIS — Y832 Surgical operation with anastomosis, bypass or graft as the cause of abnormal reaction of the patient, or of later complication, without mention of misadventure at the time of the procedure: Secondary | ICD-10-CM | POA: Diagnosis present

## 2013-09-28 DIAGNOSIS — J45909 Unspecified asthma, uncomplicated: Secondary | ICD-10-CM | POA: Diagnosis present

## 2013-09-28 DIAGNOSIS — G5690 Unspecified mononeuropathy of unspecified upper limb: Secondary | ICD-10-CM | POA: Diagnosis present

## 2013-09-28 DIAGNOSIS — J9600 Acute respiratory failure, unspecified whether with hypoxia or hypercapnia: Secondary | ICD-10-CM | POA: Diagnosis present

## 2013-09-28 DIAGNOSIS — Z01818 Encounter for other preprocedural examination: Secondary | ICD-10-CM

## 2013-09-28 DIAGNOSIS — E8779 Other fluid overload: Secondary | ICD-10-CM

## 2013-09-28 DIAGNOSIS — T502X5A Adverse effect of carbonic-anhydrase inhibitors, benzothiadiazides and other diuretics, initial encounter: Secondary | ICD-10-CM | POA: Diagnosis present

## 2013-09-28 DIAGNOSIS — J811 Chronic pulmonary edema: Secondary | ICD-10-CM

## 2013-09-28 DIAGNOSIS — D649 Anemia, unspecified: Secondary | ICD-10-CM | POA: Diagnosis present

## 2013-09-28 DIAGNOSIS — J9601 Acute respiratory failure with hypoxia: Secondary | ICD-10-CM

## 2013-09-28 DIAGNOSIS — T82898A Other specified complication of vascular prosthetic devices, implants and grafts, initial encounter: Secondary | ICD-10-CM

## 2013-09-28 HISTORY — PX: SHUNTOGRAM: SHX5491

## 2013-09-28 LAB — BASIC METABOLIC PANEL
BUN: 36 mg/dL — ABNORMAL HIGH (ref 6–23)
CO2: 26 mEq/L (ref 19–32)
Chloride: 93 mEq/L — ABNORMAL LOW (ref 96–112)
GFR calc non Af Amer: 21 mL/min — ABNORMAL LOW (ref >90)
Glucose, Bld: 94 mg/dL (ref 70–99)
Potassium: 3.4 mEq/L — ABNORMAL LOW (ref 3.5–5.1)
Sodium: 132 mEq/L — ABNORMAL LOW (ref 135–145)

## 2013-09-28 LAB — PRO B NATRIURETIC PEPTIDE: Pro B Natriuretic peptide (BNP): 8143 pg/mL — ABNORMAL HIGH (ref 0–125)

## 2013-09-28 LAB — CBC
HCT: 29.6 % — ABNORMAL LOW (ref 39.0–52.0)
MCV: 86.3 fL (ref 78.0–100.0)
RBC: 3.43 MIL/uL — ABNORMAL LOW (ref 4.22–5.81)
RDW: 16 % — ABNORMAL HIGH (ref 11.5–15.5)
WBC: 15.5 10*3/uL — ABNORMAL HIGH (ref 4.0–10.5)

## 2013-09-28 LAB — POCT I-STAT, CHEM 8
Chloride: 95 mEq/L — ABNORMAL LOW (ref 96–112)
Creatinine, Ser: 3.1 mg/dL — ABNORMAL HIGH (ref 0.50–1.35)
Glucose, Bld: 88 mg/dL (ref 70–99)
HCT: 32 % — ABNORMAL LOW (ref 39.0–52.0)
Potassium: 3.3 mEq/L — ABNORMAL LOW (ref 3.5–5.1)
Sodium: 135 mEq/L (ref 135–145)

## 2013-09-28 LAB — GLUCOSE, CAPILLARY
Glucose-Capillary: 103 mg/dL — ABNORMAL HIGH (ref 70–99)
Glucose-Capillary: 87 mg/dL (ref 70–99)
Glucose-Capillary: 86 mg/dL (ref 70–99)

## 2013-09-28 LAB — MRSA PCR SCREENING: MRSA by PCR: NEGATIVE

## 2013-09-28 SURGERY — ASSESSMENT, SHUNT FUNCTION, WITH CONTRAST RADIOGRAPHIC STUDY
Site: Arm Lower | Laterality: Left

## 2013-09-28 MED ORDER — ISOSORBIDE MONONITRATE ER 30 MG PO TB24
30.0000 mg | ORAL_TABLET | Freq: Every day | ORAL | Status: DC
  Administered 2013-09-29 – 2013-09-30 (×2): 30 mg via ORAL
  Filled 2013-09-28 (×3): qty 1

## 2013-09-28 MED ORDER — SODIUM CHLORIDE 0.9 % IV SOLN: 100.0000 mL | INTRAVENOUS | Status: DC | PRN

## 2013-09-28 MED ORDER — FUROSEMIDE 10 MG/ML IJ SOLN
40.0000 mg | Freq: Once | INTRAMUSCULAR | Status: AC
  Administered 2013-09-28: 40 mg via INTRAVENOUS

## 2013-09-28 MED ORDER — LIDOCAINE-PRILOCAINE 2.5-2.5 % EX CREA
1.0000 "application " | TOPICAL_CREAM | CUTANEOUS | Status: DC | PRN
  Filled 2013-09-28: qty 5

## 2013-09-28 MED ORDER — FUROSEMIDE 10 MG/ML IJ SOLN: 40.0000 mg | Freq: Once | INTRAMUSCULAR | Status: DC

## 2013-09-28 MED ORDER — GLIPIZIDE 10 MG PO TABS
20.0000 mg | ORAL_TABLET | Freq: Two times a day (BID) | ORAL | Status: DC
  Administered 2013-09-29 – 2013-09-30 (×3): 20 mg via ORAL
  Filled 2013-09-28 (×6): qty 2

## 2013-09-28 MED ORDER — HEPARIN SODIUM (PORCINE) 1000 UNIT/ML DIALYSIS: 1000.0000 [IU] | INTRAMUSCULAR | Status: DC | PRN

## 2013-09-28 MED ORDER — AMLODIPINE BESYLATE 10 MG PO TABS
10.0000 mg | ORAL_TABLET | Freq: Every day | ORAL | Status: DC
  Administered 2013-09-29 – 2013-09-30 (×2): 10 mg via ORAL
  Filled 2013-09-28 (×3): qty 1

## 2013-09-28 MED ORDER — SODIUM CHLORIDE 0.9 % IV SOLN
125.0000 mg | Freq: Once | INTRAVENOUS | Status: AC
  Administered 2013-09-28: 125 mg via INTRAVENOUS
  Filled 2013-09-28 (×2): qty 10

## 2013-09-28 MED ORDER — OXYCODONE-ACETAMINOPHEN 5-325 MG PO TABS
1.0000 | ORAL_TABLET | Freq: Four times a day (QID) | ORAL | Status: DC | PRN
  Administered 2013-09-28: 1 via ORAL
  Administered 2013-09-29 (×2): 2 via ORAL
  Filled 2013-09-28 (×2): qty 2
  Filled 2013-09-28: qty 1

## 2013-09-28 MED ORDER — GUAIFENESIN-DM 100-10 MG/5ML PO SYRP: 15.0000 mL | ORAL_SOLUTION | ORAL | Status: DC | PRN

## 2013-09-28 MED ORDER — ALPRAZOLAM 0.5 MG PO TABS
0.5000 mg | ORAL_TABLET | Freq: Every day | ORAL | Status: DC
  Administered 2013-09-28 – 2013-09-29 (×2): 0.5 mg via ORAL
  Filled 2013-09-28 (×2): qty 1

## 2013-09-28 MED ORDER — PNEUMOCOCCAL VAC POLYVALENT 25 MCG/0.5ML IJ INJ
0.5000 mL | INJECTION | INTRAMUSCULAR | Status: AC
  Administered 2013-09-29: 0.5 mL via INTRAMUSCULAR
  Filled 2013-09-28: qty 0.5

## 2013-09-28 MED ORDER — NITROGLYCERIN 0.4 MG SL SUBL: 0.4000 mg | SUBLINGUAL_TABLET | SUBLINGUAL | Status: DC | PRN

## 2013-09-28 MED ORDER — ONDANSETRON HCL 4 MG/2ML IJ SOLN: 4.0000 mg | Freq: Four times a day (QID) | INTRAMUSCULAR | Status: DC | PRN

## 2013-09-28 MED ORDER — MORPHINE SULFATE 2 MG/ML IJ SOLN
INTRAMUSCULAR | Status: AC
  Filled 2013-09-28: qty 1

## 2013-09-28 MED ORDER — HYDRALAZINE HCL 20 MG/ML IJ SOLN: 10.0000 mg | INTRAMUSCULAR | Status: DC | PRN

## 2013-09-28 MED ORDER — LIDOCAINE HCL (PF) 1 % IJ SOLN
INTRAMUSCULAR | Status: AC
  Filled 2013-09-28: qty 30

## 2013-09-28 MED ORDER — METOPROLOL TARTRATE 50 MG PO TABS
50.0000 mg | ORAL_TABLET | Freq: Two times a day (BID) | ORAL | Status: DC
  Administered 2013-09-29 (×2): 50 mg via ORAL
  Filled 2013-09-28 (×6): qty 1

## 2013-09-28 MED ORDER — HYDRALAZINE HCL 50 MG PO TABS
50.0000 mg | ORAL_TABLET | Freq: Two times a day (BID) | ORAL | Status: DC
  Administered 2013-09-28: 100 mg via ORAL
  Administered 2013-09-29 (×2): 50 mg via ORAL
  Filled 2013-09-28 (×6): qty 2

## 2013-09-28 MED ORDER — POTASSIUM CHLORIDE CRYS ER 20 MEQ PO TBCR: 20.0000 meq | EXTENDED_RELEASE_TABLET | Freq: Once | ORAL | Status: DC

## 2013-09-28 MED ORDER — PENTAFLUOROPROP-TETRAFLUOROETH EX AERO: 1.0000 "application " | INHALATION_SPRAY | CUTANEOUS | Status: DC | PRN

## 2013-09-28 MED ORDER — SODIUM CHLORIDE 0.9 % IV SOLN: 250.0000 mL | INTRAVENOUS | Status: DC | PRN

## 2013-09-28 MED ORDER — SODIUM CHLORIDE 0.9 % IJ SOLN: 3.0000 mL | INTRAMUSCULAR | Status: DC | PRN

## 2013-09-28 MED ORDER — RENA-VITE PO TABS
1.0000 | ORAL_TABLET | Freq: Every day | ORAL | Status: DC
  Administered 2013-09-28 (×2): via ORAL
  Administered 2013-09-29: 1 via ORAL
  Filled 2013-09-28 (×3): qty 1

## 2013-09-28 MED ORDER — ENOXAPARIN SODIUM 30 MG/0.3ML ~~LOC~~ SOLN
30.0000 mg | SUBCUTANEOUS | Status: DC
  Administered 2013-09-28 – 2013-09-30 (×3): 30 mg via SUBCUTANEOUS
  Filled 2013-09-28 (×3): qty 0.3

## 2013-09-28 MED ORDER — ALUM & MAG HYDROXIDE-SIMETH 200-200-20 MG/5ML PO SUSP: 15.0000 mL | ORAL | Status: DC | PRN

## 2013-09-28 MED ORDER — MORPHINE SULFATE 4 MG/ML IJ SOLN
2.0000 mg | Freq: Once | INTRAMUSCULAR | Status: AC
  Administered 2013-09-28: 2 mg via INTRAVENOUS

## 2013-09-28 MED ORDER — NITROGLYCERIN IN D5W 200-5 MCG/ML-% IV SOLN
INTRAVENOUS | Status: AC
  Filled 2013-09-28: qty 250

## 2013-09-28 MED ORDER — ALBUTEROL SULFATE HFA 108 (90 BASE) MCG/ACT IN AERS
2.0000 | INHALATION_SPRAY | Freq: Four times a day (QID) | RESPIRATORY_TRACT | Status: DC | PRN
  Filled 2013-09-28: qty 6.7

## 2013-09-28 MED ORDER — SODIUM CHLORIDE 0.9 % IJ SOLN
3.0000 mL | Freq: Two times a day (BID) | INTRAMUSCULAR | Status: DC
  Administered 2013-09-28: 23:00:00 via INTRAVENOUS
  Administered 2013-09-29 – 2013-09-30 (×3): 3 mL via INTRAVENOUS

## 2013-09-28 MED ORDER — BUPROPION HCL ER (XL) 300 MG PO TB24
300.0000 mg | ORAL_TABLET | Freq: Every morning | ORAL | Status: DC
  Administered 2013-09-29 – 2013-09-30 (×2): 300 mg via ORAL
  Filled 2013-09-28 (×3): qty 1

## 2013-09-28 MED ORDER — LIDOCAINE HCL (PF) 1 % IJ SOLN: 5.0000 mL | INTRAMUSCULAR | Status: DC | PRN

## 2013-09-28 MED ORDER — HEPARIN SODIUM (PORCINE) 1000 UNIT/ML DIALYSIS
100.0000 [IU]/kg | INTRAMUSCULAR | Status: DC | PRN
  Administered 2013-09-28 – 2013-09-29 (×2): 15000 [IU] via INTRAVENOUS_CENTRAL
  Filled 2013-09-28: qty 15

## 2013-09-28 MED ORDER — FUROSEMIDE 10 MG/ML IJ SOLN
INTRAMUSCULAR | Status: AC
  Filled 2013-09-28: qty 4

## 2013-09-28 MED ORDER — METOPROLOL TARTRATE 1 MG/ML IV SOLN: 2.0000 mg | INTRAVENOUS | Status: DC | PRN

## 2013-09-28 MED ORDER — FUROSEMIDE 80 MG PO TABS
160.0000 mg | ORAL_TABLET | Freq: Two times a day (BID) | ORAL | Status: DC
  Administered 2013-09-28 – 2013-09-29 (×2): 160 mg via ORAL
  Filled 2013-09-28 (×4): qty 2

## 2013-09-28 MED ORDER — MORPHINE SULFATE 2 MG/ML IJ SOLN
INTRAMUSCULAR | Status: AC
  Administered 2013-09-28: 2 mg via INTRAVENOUS
  Filled 2013-09-28: qty 1

## 2013-09-28 MED ORDER — HEPARIN (PORCINE) IN NACL 2-0.9 UNIT/ML-% IJ SOLN
INTRAMUSCULAR | Status: AC
  Filled 2013-09-28: qty 1000

## 2013-09-28 MED ORDER — PANTOPRAZOLE SODIUM 40 MG PO TBEC
40.0000 mg | DELAYED_RELEASE_TABLET | Freq: Every day | ORAL | Status: DC
  Administered 2013-09-28 – 2013-09-30 (×3): 40 mg via ORAL
  Filled 2013-09-28 (×3): qty 1

## 2013-09-28 MED ORDER — ALTEPLASE 2 MG IJ SOLR
2.0000 mg | Freq: Once | INTRAMUSCULAR | Status: AC | PRN
  Filled 2013-09-28: qty 2

## 2013-09-28 MED ORDER — INSULIN GLARGINE 100 UNIT/ML ~~LOC~~ SOLN
30.0000 [IU] | Freq: Every day | SUBCUTANEOUS | Status: DC
  Administered 2013-09-28 – 2013-09-29 (×2): 30 [IU] via SUBCUTANEOUS
  Filled 2013-09-28 (×3): qty 0.3

## 2013-09-28 MED ORDER — NEPRO/CARBSTEADY PO LIQD
237.0000 mL | ORAL | Status: DC | PRN
  Filled 2013-09-28: qty 237

## 2013-09-28 MED ORDER — NITROGLYCERIN IN D5W 200-5 MCG/ML-% IV SOLN
5.0000 ug/min | INTRAVENOUS | Status: DC
  Administered 2013-09-28: 10 ug/min via INTRAVENOUS

## 2013-09-28 MED ORDER — PHENOL 1.4 % MT LIQD
1.0000 | OROMUCOSAL | Status: DC | PRN
  Filled 2013-09-28: qty 177

## 2013-09-28 MED ORDER — SIMVASTATIN 20 MG PO TABS
20.0000 mg | ORAL_TABLET | Freq: Every evening | ORAL | Status: DC
  Administered 2013-09-28 – 2013-09-29 (×2): 20 mg via ORAL
  Filled 2013-09-28 (×3): qty 1

## 2013-09-28 MED ORDER — MORPHINE SULFATE 10 MG/ML IJ SOLN
2.0000 mg | INTRAMUSCULAR | Status: DC | PRN
  Administered 2013-09-28 – 2013-09-29 (×3): 2 mg via INTRAVENOUS

## 2013-09-28 MED ORDER — LABETALOL HCL 5 MG/ML IV SOLN: 10.0000 mg | INTRAVENOUS | Status: DC | PRN

## 2013-09-28 NOTE — Interval H&P Note (Signed)
History and Physical Interval Note:  09/28/2013 10:13 AM  Timothy Banks  has presented today for surgery, with the diagnosis of ESRD  The various methods of treatment have been discussed with the patient and family. After consideration of risks, benefits and other options for treatment, the patient has consented to  Procedure(s): FISTULOGRAM POSSIBLE INTERVENTION (Left) as a surgical intervention .  The patient's history has been reviewed, patient examined, no change in status, stable for surgery.  I have reviewed the patient's chart and labs.  Questions were answered to the patient's satisfaction.     Philipe Laswell S

## 2013-09-28 NOTE — H&P (View-Only) (Signed)
[facility] [place]  Postoperative Access Visit  History of Present Illness  Timothy Banks is a 57 y.o. year old male who presents for postoperative follow-up for: L upper arm ulnar-cephalic AVF (Date: 07/20/13).  The patient's wounds are healed.  The patient notes no steal symptoms.  The patient is able to complete their activities of daily living.  The patient's current symptoms are: none.  Pt's RIJ TDC has not been functioning well.  Past Medical History  Diagnosis Date  . Myocardial infarction   . Diabetes mellitus without complication   . CHF (congestive heart failure)   . Asthma   . Hyperlipidemia   . Hypertension   . Chronic kidney disease 02/17/2013    ACUTE RENAL  . Coronary artery disease   . Anginal pain   . Neuromuscular disorder     neuropathy in hands and feet    Past Surgical History  Procedure Laterality Date  . Right carotid endarterectomy  2002    right side  . Coronary artery bypass graft  2002  . Av fistula placement Right 02/25/2013    Procedure: ARTERIOVENOUS (AV) FISTULA CREATION;  Surgeon: Charles E Fields, MD;  Location: [facility] OR;  Service: Vascular;  Laterality: Right;  Right Radiocephalic AVF  . Insertion of dialysis catheter Right 03/12/2013    Procedure: ultrasound guided INSERTION OF DIALYSIS CATHETER right internal jugular using arrow 23cm;  Surgeon: Christopher S Dickson, MD;  Location: [facility] OR;  Service: Vascular;  Laterality: Right;  . Ligation of arteriovenous  fistula Right 04/27/2013    Procedure: LIGATION OF RIGHT RADIAL CEPHALIC ARTERIOVENOUS  FISTULA;  Surgeon: Monasia Lair L Milas Schappell, MD;  Location: [facility] OR;  Service: Vascular;  Laterality: Right;  . Eye surgery      cataract surgery both eyes  . Removal of a dialysis catheter Right 06/10/2013    Procedure: REMOVAL OF RIGHT INTERNAL JUGULAR TUNNELED DIALYSIS CATHETER;  Surgeon: Todd F Early, MD;  Location: [facility] OR;  Service: Vascular;  Laterality: Right;  . Insertion of dialysis catheter Left  06/10/2013    Procedure: INSERTION OF LEFT INTERNAL JUGULAR TUNNELED DIALYSIS CATHETER;  Surgeon: Todd F Early, MD;  Location: [facility] OR;  Service: Vascular;  Laterality: Left;  . Removal of a dialysis catheter Left 07/20/2013    Procedure: REMOVAL OF A DIALYSIS CATHETER;  Surgeon: Holli Rengel L Amariah Kierstead, MD;  Location: [facility] OR;  Service: Vascular;  Laterality: Left;  . Insertion of dialysis catheter Right 07/20/2013    Procedure: INSERTION OF DIALYSIS CATHETER, Right Internal Jugular;  Surgeon: Rahim Astorga L Navea Woodrow, MD;  Location: [facility] OR;  Service: Vascular;  Laterality: Right;  . Av fistula placement Left 07/20/2013    Procedure: ARTERIOVENOUS (AV) FISTULA CREATION;  Surgeon: Nirvi Boehler L Danice Dippolito, MD;  Location: [facility] OR;  Service: Vascular;  Laterality: Left;    History   Social History  . Marital Status: Married    Spouse Name: N/A    Number of Children: N/A  . Years of Education: N/A   Occupational History  . Not on file.   Social History Main Topics  . Smoking status: Current Every Day Smoker -- 0.50 packs/day for 26 years    Types: Cigarettes    Start date: 11/05/1973    Last Attempt to Quit: 06/09/2003  . Smokeless tobacco: Never Used     Comment: quit for 10 years, started back 9 mo ago  . Alcohol Use: No  . Drug Use: No  . Sexual Activity: Not on file     Other Topics Concern  . Not on file   Social History Narrative  . No narrative on file    Family History  Problem Relation Age of Onset  . Diabetes Mother   . Heart disease Mother   . Diabetes Father   . Heart disease Father     Current Outpatient Prescriptions on File Prior to Visit  Medication Sig Dispense Refill  . albuterol (PROVENTIL HFA;VENTOLIN HFA) 108 (90 BASE) MCG/ACT inhaler Inhale 2 puffs into the lungs every 6 (six) hours as needed for wheezing or shortness of breath.      . ALPRAZolam (XANAX) 0.5 MG tablet Take 0.5 mg by mouth at bedtime.      . amLODipine (NORVASC) 10 MG tablet Take 10 mg by mouth daily.      . buPROPion  (WELLBUTRIN XL) 300 MG 24 hr tablet Take 300 mg by mouth every morning.      . furosemide (LASIX) 80 MG tablet Take 2 tablets (160 mg total) by mouth 2 (two) times daily.  60 tablet  0  . glipiZIDE (GLUCOTROL) 10 MG tablet Take 20 mg by mouth 2 (two) times daily before a meal.      . hydrALAZINE (APRESOLINE) 50 MG tablet Take 50-100 mg by mouth 2 (two) times daily. Takes 100mg in the morning & 50mg at night      . insulin glargine (LANTUS) 100 UNIT/ML injection Inject 30 Units into the skin at bedtime.      . isosorbide mononitrate (IMDUR) 60 MG 24 hr tablet Take 30 mg by mouth daily.      . metoprolol (LOPRESSOR) 50 MG tablet Take 1 tablet (50 mg total) by mouth 2 (two) times daily.  60 tablet  0  . nitroGLYCERIN (NITROSTAT) 0.4 MG SL tablet Place 0.4 mg under the tongue every 5 (five) minutes as needed for chest pain. x3 doses as needed for chest pain      . oxyCODONE (ROXICODONE) 15 MG immediate release tablet Take 15 mg by mouth every 6 (six) hours as needed for pain. For pain      . oxyCODONE-acetaminophen (PERCOCET/ROXICET) 5-325 MG per tablet Take 1 tablet by mouth every 6 (six) hours as needed for pain.  30 tablet  0  . simvastatin (ZOCOR) 20 MG tablet Take 20 mg by mouth every evening.       No current facility-administered medications on file prior to visit.    No Known Allergies  REVIEW OF SYSTEMS:  (Positives checked otherwise negative)  CARDIOVASCULAR:  [] chest pain, [] chest pressure, [] palpitations, [] shortness of breath when laying flat, [] shortness of breath with exertion,  [] pain in feet when walking, [] pain in feet when laying flat, [] history of blood clot in veins (DVT), [] history of phlebitis, [] swelling in legs, [] varicose veins  PULMONARY:  [] productive cough, [] asthma, [] wheezing  NEUROLOGIC:  [] weakness in arms or legs, [] numbness in arms or legs, [] difficulty speaking or slurred speech, [] temporary loss of vision in one eye, []  dizziness  HEMATOLOGIC:  [] bleeding problems, [] problems with blood clotting too easily  MUSCULOSKEL:  [] joint pain, [] joint swelling  GASTROINTEST:  [] vomiting blood, [] blood in stool     GENITOURINARY:  [] burning with urination, [] blood in urine, [x] ESRD-HD: T-R-S  PSYCHIATRIC:  [] history of major depression  INTEGUMENTARY:  [] rashes, [] ulcers   For VQI Use Only    PRE-ADM LIVING: Home  AMB STATUS: Ambulatory  Physical Examination Filed Vitals:   09/25/13 1419  BP: 147/55  Pulse: 71    RUE: Incision is healed, skin feels warm, hand grip is 5/5, sensation in digits is intact, palpable thrill, bruit can be auscultated with normal sound distally and wheezy sound in mid-arm  Pulmonary: Sym exp, good air movt, CTAB, no rales, rhonchi, & wheezing  Cardiac: RRR, Nl S1, S2, no Murmurs, rubs or gallops  L access duplex (09/25/2013)  Diameters: 6.3-11.3 mm  Depth: 2.0-8.4 mm  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 year old male who presents s/p L U-C AVF with frozen valves.  I am concerned the frozen valves in this fistula might compromise the function of the fistula.  I am arranging a L arm fistulogram with intervention with Dr. Dickson on Monday 09/28/13  After successful intervention, I believe this fistula should be used for HD purpose as the TDC is no functioning adequately.  The patient's tunneled dialysis catheter can be removed after two successful cannulations and completed dialysis treatments.  Thank you for allowing us to participate in this patient's care.  Stirling Orton, MD [facility] [place] Office: [phone] Pager: [phone]  09/25/2013, 2:46 PM    

## 2013-09-28 NOTE — Consult Note (Signed)
[facility] Renal Consultation Note    Indication for Consultation:  Management of ESRD/hemodialysis; anemia, hypertension/volume and secondary hyperparathyroidism  HPI: Timothy Banks is a 57 y.o. male with ESRD on dialysis since May 2014 dialyzing with a poorly functional catheter was her for a fistulagram with venoplasty of venous anatamosis and vavlve by Dr. Wallace Keller acutely SOB while supine and Dr. Edilia Bo aborted the procedure. Usual weight gains are 3 - 5 kg not huge given his size.  Able to get to usual edw of 148 kg on most occasions. He does not use CPAP. He does take lasix bid. SOB but wants Bipap off. No CP.  Past Medical History  Diagnosis Date  . Myocardial infarction   . Diabetes mellitus without complication   . CHF (congestive heart failure)   . Asthma   . Hyperlipidemia   . Hypertension   . Chronic kidney disease 02/17/2013    ACUTE RENAL  . Coronary artery disease   . Anginal pain   . Neuromuscular disorder     neuropathy in hands and feet   Past Surgical History  Procedure Laterality Date  . Right carotid endarterectomy  2002    right side  . Coronary artery bypass graft  2002  . Av fistula placement Right 02/25/2013    Procedure: ARTERIOVENOUS (AV) FISTULA CREATION;  Surgeon: Sherren Kerns, MD;  Location: [facility] OR;  Service: Vascular;  Laterality: Right;  Right Radiocephalic AVF  . Insertion of dialysis catheter Right 03/12/2013    Procedure: ultrasound guided INSERTION OF DIALYSIS CATHETER right internal jugular using arrow 23cm;  Surgeon: Chuck Hint, MD;  Location: [facility] OR;  Service: Vascular;  Laterality: Right;  . Ligation of arteriovenous  fistula Right 04/27/2013    Procedure: LIGATION OF RIGHT RADIAL CEPHALIC ARTERIOVENOUS  FISTULA;  Surgeon: Fransisco Hertz, MD;  Location: [facility] OR;  Service: Vascular;  Laterality: Right;  . Eye surgery      cataract surgery both eyes  . Removal of a dialysis catheter Right 06/10/2013    Procedure:  REMOVAL OF RIGHT INTERNAL JUGULAR TUNNELED DIALYSIS CATHETER;  Surgeon: Larina Earthly, MD;  Location: [facility] OR;  Service: Vascular;  Laterality: Right;  . Insertion of dialysis catheter Left 06/10/2013    Procedure: INSERTION OF LEFT INTERNAL JUGULAR TUNNELED DIALYSIS CATHETER;  Surgeon: Larina Earthly, MD;  Location: [facility] OR;  Service: Vascular;  Laterality: Left;  . Removal of a dialysis catheter Left 07/20/2013    Procedure: REMOVAL OF A DIALYSIS CATHETER;  Surgeon: Fransisco Hertz, MD;  Location: [facility] OR;  Service: Vascular;  Laterality: Left;  . Insertion of dialysis catheter Right 07/20/2013    Procedure: INSERTION OF DIALYSIS CATHETER, Right Internal Jugular;  Surgeon: Fransisco Hertz, MD;  Location: [facility] OR;  Service: Vascular;  Laterality: Right;  . Av fistula placement Left 07/20/2013    Procedure: ARTERIOVENOUS (AV) FISTULA CREATION;  Surgeon: Fransisco Hertz, MD;  Location: [facility] OR;  Service: Vascular;  Laterality: Left;   Family History  Problem Relation Age of Onset  . Diabetes Mother   . Heart disease Mother   . Diabetes Father   . Heart disease Father    Social History:  reports that he has been smoking Cigarettes.  He started smoking about 39 years ago. He has a 13 pack-year smoking history. He has never used smokeless tobacco. He reports that he does not drink alcohol or use illicit drugs. No Known Allergies Prior to Admission medications  Medication Sig Start Date End Date Taking? Authorizing Provider  albuterol (PROVENTIL HFA;VENTOLIN HFA) 108 (90 BASE) MCG/ACT inhaler Inhale 2 puffs into the lungs every 6 (six) hours as needed for wheezing or shortness of breath.   Yes Historical Provider, MD  ALPRAZolam Prudy Feeler) 0.5 MG tablet Take 0.5 mg by mouth at bedtime.   Yes Historical Provider, MD  amLODipine (NORVASC) 10 MG tablet Take 10 mg by mouth daily.   Yes Historical Provider, MD  buPROPion (WELLBUTRIN XL) 300 MG 24 hr tablet Take 300 mg by mouth every morning.   Yes Historical Provider, MD   furosemide (LASIX) 80 MG tablet Take 2 tablets (160 mg total) by mouth 2 (two) times daily. 03/06/13  Yes [name], MD  glipiZIDE (GLUCOTROL) 10 MG tablet Take 20 mg by mouth 2 (two) times daily before a meal.   Yes Historical Provider, MD  hydrALAZINE (APRESOLINE) 50 MG tablet Take 50-100 mg by mouth 2 (two) times daily. Takes 100mg  in the morning & 50mg  at night   Yes Historical Provider, MD  insulin glargine (LANTUS) 100 UNIT/ML injection Inject 30 Units into the skin at bedtime.   Yes Historical Provider, MD  isosorbide mononitrate (IMDUR) 60 MG 24 hr tablet Take 30 mg by mouth daily.   Yes Historical Provider, MD  metoprolol (LOPRESSOR) 50 MG tablet Take 1 tablet (50 mg total) by mouth 2 (two) times daily. 02/23/13  Yes [name], MD  nitroGLYCERIN (NITROSTAT) 0.4 MG SL tablet Place 0.4 mg under the tongue every 5 (five) minutes as needed for chest pain. x3 doses as needed for chest pain   Yes Historical Provider, MD  oxyCODONE (ROXICODONE) 15 MG immediate release tablet Take 15 mg by mouth every 6 (six) hours as needed for pain. For pain 06/10/13  Yes Lars Mage, PA-C  oxyCODONE-acetaminophen (PERCOCET/ROXICET) 5-325 MG per tablet Take 1 tablet by mouth every 6 (six) hours as needed for pain. 07/20/13  Yes Lars Mage, PA-C  simvastatin (ZOCOR) 20 MG tablet Take 20 mg by mouth every evening.   Yes Eugenia Pancoast   Current Facility-Administered Medications  Medication Dose Route Frequency Provider Last Rate Last Dose  . 0.9 %  sodium chloride infusion  250 mL Intravenous PRN Chuck Hint, MD      . albuterol (PROVENTIL HFA;VENTOLIN HFA) 108 (90 BASE) MCG/ACT inhaler 2 puff  2 puff Inhalation Q6H PRN Chuck Hint, MD      . ALPRAZolam Prudy Feeler) tablet 0.5 mg  0.5 mg Oral QHS Chuck Hint, MD      . alum & mag hydroxide-simeth (MAALOX/MYLANTA) 200-200-20 MG/5ML suspension 15-30 mL  15-30 mL Oral Q2H PRN Chuck Hint, MD      . amLODipine (NORVASC)  tablet 10 mg  10 mg Oral Daily Chuck Hint, MD      . buPROPion (WELLBUTRIN XL) 24 hr tablet 300 mg  300 mg Oral q morning - 10a Chuck Hint, MD      . enoxaparin (LOVENOX) injection 30 mg  30 mg Subcutaneous Q24H Chuck Hint, MD      . ferric gluconate (NULECIT) 125 mg in sodium chloride 0.9 % 100 mL IVPB  125 mg Intravenous Once in dialysis Sheffield Slider, PA-C      . furosemide (LASIX) tablet 160 mg  160 mg Oral BID Chuck Hint, MD      . glipiZIDE (GLUCOTROL) tablet 20 mg  20 mg Oral BID AC Chuck Hint,  MD      . guaiFENesin-dextromethorphan (ROBITUSSIN DM) 100-10 MG/5ML syrup 15 mL  15 mL Oral Q4H PRN Chuck Hint, MD      . hydrALAZINE (APRESOLINE) injection 10 mg  10 mg Intravenous Q2H PRN Chuck Hint, MD      . hydrALAZINE (APRESOLINE) tablet 50-100 mg  50-100 mg Oral BID Chuck Hint, MD      . insulin glargine (LANTUS) injection 30 Units  30 Units Subcutaneous QHS Chuck Hint, MD      . isosorbide mononitrate (IMDUR) 24 hr tablet 30 mg  30 mg Oral Daily Chuck Hint, MD      . labetalol (NORMODYNE,TRANDATE) injection 10 mg  10 mg Intravenous Q2H PRN Chuck Hint, MD      . metoprolol (LOPRESSOR) injection 2-5 mg  2-5 mg Intravenous Q2H PRN Chuck Hint, MD      . metoprolol (LOPRESSOR) tablet 50 mg  50 mg Oral BID Chuck Hint, MD      . morphine injection 2 mg  2 mg Intravenous Q2H PRN Chuck Hint, MD   2 mg at 09/28/13 1352  . nitroGLYCERIN (NITROSTAT) SL tablet 0.4 mg  0.4 mg Sublingual Q5 min PRN Chuck Hint, MD      . nitroGLYCERIN 0.2 mg/mL in dextrose 5 % infusion  5-200 mcg/min Intravenous Titrated Chuck Hint, MD 3 mL/hr at 09/28/13 1202 10 mcg/min at 09/28/13 1202  . ondansetron (ZOFRAN) injection 4 mg  4 mg Intravenous Q6H PRN Chuck Hint, MD      . oxyCODONE-acetaminophen (PERCOCET/ROXICET) 5-325 MG per tablet  1-2 tablet  1-2 tablet Oral Q6H PRN Chuck Hint, MD      . pantoprazole (PROTONIX) EC tablet 40 mg  40 mg Oral Daily Chuck Hint, MD      . phenol (CHLORASEPTIC) mouth spray 1 spray  1 spray Mouth/Throat PRN Chuck Hint, MD      . potassium chloride SA (K-DUR,KLOR-CON) CR tablet 20-40 mEq  20-40 mEq Oral Once Chuck Hint, MD      . simvastatin (ZOCOR) tablet 20 mg  20 mg Oral QPM Chuck Hint, MD      . sodium chloride 0.9 % injection 3 mL  3 mL Intravenous Q12H Chuck Hint, MD      . sodium chloride 0.9 % injection 3 mL  3 mL Intravenous PRN Chuck Hint, MD       Labs: Basic Metabolic Panel:  Recent Labs Lab 09/28/13 0925  NA 135  K 3.3*  CL 95*  GLUCOSE 88  BUN 34*  CREATININE 3.10*  CBC:  Recent Labs Lab 09/28/13 0925  HGB 10.9*  HCT 32.0*   CBG:  Recent Labs Lab 09/28/13 1154  GLUCAP 103*  Studies/Results: Dg Chest Portable 1 View  09/28/2013   CLINICAL DATA:  Shortness of breath, fluid overload, dialysis patient  EXAM: PORTABLE CHEST - 1 VIEW  COMPARISON:  07/20/2013  FINDINGS: Right IJ dialysis catheter tips in the lower SVC. Prior coronary bypass changes noted. Cardiomegaly evident with increased diffuse interstitial edema pattern compatible with CHF. No large effusion or pneumothorax. Trachea midline.  IMPRESSION: Worsening CHF pattern   Electronically Signed   By: Ruel Favors M.D.   On: 09/28/2013 12:12    ROS: As per HPI - limited by SOB and BIPAP  Physical Exam: Filed Vitals:   09/28/13 1153 09/28/13 1445 09/28/13 1500 09/28/13 1515  BP:  159/60 157/53 155/54  Pulse: 82 66 66 67  Temp:      TempSrc:      Resp: 32 12 18 10   Height:      Weight:      SpO2: 100% 100% 100% 100%     General: Well developed, well nourished, obese wearing BIPAP causing muffled speech Head: Normocephalic, atraumatic, sclera non-icteric,  Neck: Supple. Neck thick with rolls of fat Lungs: diminished BS  Breathing is labored. Heart: RRR with S1 S2. No murmurs, rubs,  Abdomen: Soft, non-tender, non-distended with normoactive bowel sounds. No rebound/guarding. No obvious abdominal masses. M-S:  Strength and tone appear normal for age. Lower extremities: 2 ++ LE edema  Neuro: Alert and oriented X 3. Moves all extremities spontaneously. Psych:  Responds to questions appropriately with a normal affect. Dialysis Access: poorly developed  Left lower avf + bruit and thrill; right I-J [facility]  Dialysis Orders: Center: [facility] 5 hr Optiflux 180 EDW 146 left lower AVF and right I-J 2 K 2.25 Ca heparin 10 K Epo 2200 no hectorol or Fe Recent labs:  Hgb 10 11/20 14% sat ferritin 724 K 3.3 kPTH 274 Usual del kt/v ~1  Assessment/Plan: 1. Acute SOB - CHF on CXR - massive obesity may have been contributory;  Only 1.6 above EDW - set goal for 5 L; need standing weights 2. ESRD -  TTS - HD today due to vol excess - usual orders - 4 K bath; kinetics have been suboptimal - cath flows in mid 300s with low DFR - needs DFR 800 at d/c and Optiflux 200 due to his large size;  If catheter flow issues, < 300 would replace catheter; use  Standard heparin in split dose and do this at d/c as well 3. Hypertension/volume  - decrease volume; I suspect he needs a lower edw; may need to adjust meds to get volume down; start with 5 L today; uses lasix at home. Repeat CXR in am; can dialyze again Tuesday if still excess volume, lower extremity edema or sob 4. Anemia  - Hgb 10.9; tsat low; start IV Fe -x 1;  no ESA today; if not d/c can start later otherwise resume outpt dose 5. Metabolic bone disease -  Not on vit D 6. Nutrition - needs high protein renal diet 7. Morbid obesity/DM   Sheffield Slider, PA-C [facility] [phone] 09/28/2013, 3:44 PM   Renal Attending:  I agree with the evaluation and management as articulated by Ms. Doran Durand, PA-c.  Patient had SOB last PM according to pt's wife and has home O2 for  uncertain reasons.  He has significant volume overload and will need a much lower dry weight upon discharge. I think dialysis 3 days in a row is warranted. Tinita Brooker C

## 2013-09-28 NOTE — Consult Note (Signed)
PULMONARY  / CRITICAL CARE MEDICINE  Name: Timothy Banks MRN: [id number] DOB: 1956/10/12    ADMISSION DATE:  09/28/2013 CONSULTATION DATE:  09/28/2013  REFERRING MD :  Edilia Bo PRIMARY SERVICE: Vascular  CHIEF COMPLAINT:  CHF Exacerbation/SOB  BRIEF PATIENT DESCRIPTION: 57 y.o. M with ESRD, underwent fistulogram with venoplasty of anastamosis 11/24 due to non-functioning left upper arm fistula that was previously placed.  During procedure, pt became acutely SOB while supine, presumed secondary to acute CHF exacerbation.  PCCM consulted for assistance with acute respiratory failure.  SIGNIFICANT EVENTS / STUDIES:  11/24 >>> L arm fistulogram, venoplasty of anastamosis.  Decompensated during procedure presumed secondary to CHF exacerbation.  LINES / TUBES: Right HD cath 07/20/13 >>> Foley 11/24 >>>  CULTURES None  ANTIBIOTICS: None  HISTORY OF PRESENT ILLNESS:  Timothy Banks is a 57 y.o. male who dialyzes in [place] on Tuesdays Thursdays and Saturdays. His hemodialysis catheter has not been working well recently, so he was seen by Dr. Imogene Burn. He was noted to have 2 frozen valves in the fistula and was set up for a fistulogram on 11/24.  During the fistulogram, he became acutely short of breath while lying flat, requiring staff to assist him to sit back up in order to breathe.  His procedure was subsequently stopped prior to completion. During my exam, he denies chest pain, N/V, abdominal pain, increased SOB.  He feels that his breathing has become somewhat easier on BiPAP. It was felt that he would require dialysis and was therefore admitted. Nephrology has been consulted for further recs and dialysis. We were consulted for assistance with his acute SOB/respiratory failure.  PAST MEDICAL HISTORY :  Past Medical History  Diagnosis Date  . Myocardial infarction   . Diabetes mellitus without complication   . CHF (congestive heart failure)   . Asthma   . Hyperlipidemia   . Hypertension   .  Chronic kidney disease 02/17/2013    ACUTE RENAL  . Coronary artery disease   . Anginal pain   . Neuromuscular disorder     neuropathy in hands and feet   Past Surgical History  Procedure Laterality Date  . Right carotid endarterectomy  2002    right side  . Coronary artery bypass graft  2002  . Av fistula placement Right 02/25/2013    Procedure: ARTERIOVENOUS (AV) FISTULA CREATION;  Surgeon: Sherren Kerns, MD;  Location: [facility] OR;  Service: Vascular;  Laterality: Right;  Right Radiocephalic AVF  . Insertion of dialysis catheter Right 03/12/2013    Procedure: ultrasound guided INSERTION OF DIALYSIS CATHETER right internal jugular using arrow 23cm;  Surgeon: Chuck Hint, MD;  Location: [facility] OR;  Service: Vascular;  Laterality: Right;  . Ligation of arteriovenous  fistula Right 04/27/2013    Procedure: LIGATION OF RIGHT RADIAL CEPHALIC ARTERIOVENOUS  FISTULA;  Surgeon: Fransisco Hertz, MD;  Location: [facility] OR;  Service: Vascular;  Laterality: Right;  . Eye surgery      cataract surgery both eyes  . Removal of a dialysis catheter Right 06/10/2013    Procedure: REMOVAL OF RIGHT INTERNAL JUGULAR TUNNELED DIALYSIS CATHETER;  Surgeon: Larina Earthly, MD;  Location: [facility] OR;  Service: Vascular;  Laterality: Right;  . Insertion of dialysis catheter Left 06/10/2013    Procedure: INSERTION OF LEFT INTERNAL JUGULAR TUNNELED DIALYSIS CATHETER;  Surgeon: Larina Earthly, MD;  Location: [facility] OR;  Service: Vascular;  Laterality: Left;  . Removal of a dialysis catheter Left 07/20/2013    Procedure:  REMOVAL OF A DIALYSIS CATHETER;  Surgeon: Fransisco Hertz, MD;  Location: [facility] OR;  Service: Vascular;  Laterality: Left;  . Insertion of dialysis catheter Right 07/20/2013    Procedure: INSERTION OF DIALYSIS CATHETER, Right Internal Jugular;  Surgeon: Fransisco Hertz, MD;  Location: [facility] OR;  Service: Vascular;  Laterality: Right;  . Av fistula placement Left 07/20/2013    Procedure: ARTERIOVENOUS (AV) FISTULA CREATION;  Surgeon: Fransisco Hertz, MD;  Location: [facility] OR;  Service: Vascular;  Laterality: Left;   Prior to Admission medications   Medication Sig Start Date End Date Taking? Authorizing Provider  albuterol (PROVENTIL HFA;VENTOLIN HFA) 108 (90 BASE) MCG/ACT inhaler Inhale 2 puffs into the lungs every 6 (six) hours as needed for wheezing or shortness of breath.   Yes Historical Provider, MD  ALPRAZolam Prudy Feeler) 0.5 MG tablet Take 0.5 mg by mouth at bedtime.   Yes Historical Provider, MD  amLODipine (NORVASC) 10 MG tablet Take 10 mg by mouth daily.   Yes Historical Provider, MD  buPROPion (WELLBUTRIN XL) 300 MG 24 hr tablet Take 300 mg by mouth every morning.   Yes Historical Provider, MD  furosemide (LASIX) 80 MG tablet Take 2 tablets (160 mg total) by mouth 2 (two) times daily. 03/06/13  Yes [name], MD  glipiZIDE (GLUCOTROL) 10 MG tablet Take 20 mg by mouth 2 (two) times daily before a meal.   Yes Historical Provider, MD  hydrALAZINE (APRESOLINE) 50 MG tablet Take 50-100 mg by mouth 2 (two) times daily. Takes 100mg  in the morning & 50mg  at night   Yes Historical Provider, MD  insulin glargine (LANTUS) 100 UNIT/ML injection Inject 30 Units into the skin at bedtime.   Yes Historical Provider, MD  isosorbide mononitrate (IMDUR) 60 MG 24 hr tablet Take 30 mg by mouth daily.   Yes Historical Provider, MD  metoprolol (LOPRESSOR) 50 MG tablet Take 1 tablet (50 mg total) by mouth 2 (two) times daily. 02/23/13  Yes [name], MD  nitroGLYCERIN (NITROSTAT) 0.4 MG SL tablet Place 0.4 mg under the tongue every 5 (five) minutes as needed for chest pain. x3 doses as needed for chest pain   Yes Historical Provider, MD  oxyCODONE (ROXICODONE) 15 MG immediate release tablet Take 15 mg by mouth every 6 (six) hours as needed for pain. For pain 06/10/13  Yes Lars Mage, PA-C  oxyCODONE-acetaminophen (PERCOCET/ROXICET) 5-325 MG per tablet Take 1 tablet by mouth every 6 (six) hours as needed for pain. 07/20/13  Yes Lars Mage, PA-C   simvastatin (ZOCOR) 20 MG tablet Take 20 mg by mouth every evening.   Yes Eugenia Pancoast   FAMILY HISTORY:  Family History  Problem Relation Age of Onset  . Diabetes Mother   . Heart disease Mother   . Diabetes Father   . Heart disease Father    SOCIAL HISTORY:  reports that he has been smoking Cigarettes.  He started smoking about 39 years ago. He has a 13 pack-year smoking history. He has never used smokeless tobacco. He reports that he does not drink alcohol or use illicit drugs.  REVIEW OF SYSTEMS:  Negative except as stated in HPI.  SUBJECTIVE: Still SOB but improved since onset. On BiPAP 16/6, SpO2 98%.  Denies chest pain.  VITAL SIGNS: Temp:  [98.1 F (36.7 C)] 98.1 F (36.7 C) (11/24 0855) Pulse Rate:  [65-82] 82 (11/24 1153) Resp:  [18-32] 32 (11/24 1153) BP: (148)/(58) 148/58 mmHg (11/24 0855) SpO2:  [90 %-100 %]  100 % (11/24 1153) FiO2 (%):  [50 %] 50 % (11/24 1153) Weight:  [330 lb (149.687 kg)] 330 lb (149.687 kg) (11/24 0855) HEMODYNAMICS:   VENTILATOR SETTINGS: Vent Mode:  [-]  FiO2 (%):  [50 %] 50 % INTAKE / OUTPUT: Intake/Output   None    PHYSICAL EXAMINATION: General:  Obese male, on BiPAP, in mild distress. Neuro:  A&O x 3.  Follows commands. HEENT:  [place]/AT.  PERRL. BiPAP in place. Cardiovascular:  RRR, no M/R/G appreciated. Lungs:  Diminished air movement bilaterally.  No W/R/R appreciated with respect to body habitus. Abdomen:  BS hypoactive x 4. Soft, NT/ND. Musculoskeletal:  No gross deformities.  Old/well healed scars on forearms.  2 - 3 + edema in bilateral legs. Skin:  Warm and dry.  LABS:  CBC  Recent Labs Lab 09/28/13 0925  HGB 10.9*  HCT 32.0*   BMET  Recent Labs Lab 09/28/13 0925  NA 135  K 3.3*  CL 95*  BUN 34*  CREATININE 3.10*  GLUCOSE 88    CXR: 11/24 > vascular congestion with cephalization (my read).  R HD cath in place.  ASSESSMENT / PLAN:  PULMONARY A: Acute Respiratory Failure - presumed in the  setting of acute CHF exacerbation. Concern for Pulmonary Edema. P:   - BiPAP now, titrate settings to maintain SpO2 > 94%, no indication for ABG at this time. - Wean BiPAP as able, likely after dialysis. - May require intubation as respiratory status remains tenuous. - HD for volume negative. - F/u CXR in AM.  CARDIOVASCULAR A: Acute CHF Exacerbation - presumed secondary to fluid overload. P:  - BiPAP as above. - Check BNP. - Lasix per renal if indicated. - Strict I/O's & daily weights.  RENAL A:   ESRD - Chronic. Hypokalemia - presumed secondary to lasix. P:   - Nephro consult - dialysis today. - K replacement, 4 runs now. - f/u BMP @ 1600. - f/u BMP in AM.  All other problems, manage per primary team.  Rutherford Guys, PA - S  I have personally obtained a history, examined the patient, evaluated laboratory and imaging results, formulated the assessment and plan and placed orders.  CRITICAL CARE: The patient is critically ill with multiple organ systems failure and requires high complexity decision making for assessment and support, frequent evaluation and titration of therapies, application of advanced monitoring technologies and extensive interpretation of multiple databases. Critical Care Time devoted to patient care services described in this note is 40 minutes.   Alyson Reedy, M.D. Pulmonary and Critical Care Medicine [facility] Pager: [phone]  09/28/2013, 12:10 PM

## 2013-09-28 NOTE — Op Note (Signed)
   PATIENT: Timothy Banks      MRN: [id number] DOB: [birth year]    DATE OF PROCEDURE: 09/28/2013  INDICATIONS: LEEVON UPPERMAN is a 57 y.o. male who dialyzes on Tuesdays Thursdays and Saturdays in [place]. He has a catheter which has not been functioning well. He had an upper arm fistula and was seen by Dr. Imogene Burn. Fistula was noted to have 2 frozen valves in the proximal fistula and he was set up for a fistulogram.  PROCEDURE:  1. Ultrasound-guided access to left upper arm AV fistula 2. fistulogram left upper arm AV fistula 3. venoplasty of anastomosis and a valve site of left upper arm AV fistula  SURGEON: Di Kindle. Edilia Bo, MD, FACS  ANESTHESIA: local   EBL: minimal  TECHNIQUE: The patient was taken to the vascular lab. The left upper extremity was prepped and draped in usual sterile fashion. The areas of concern were in the proximal fistula near the antecubital level. I therefore cannulated the fistula retrograde in the mid upper arm. Under ultrasound guidance, after the skin was anesthetized, the fistula was cannulated using a micropuncture needle and a micropuncture sheath was introduced over the wire. A sonogram was obtained evaluating the fistula from the point of cannulation up to the central veins. There was one competing branches in the mid upper arm. There is no central venous stenosis.I was able to get a Bentson wire through the micropuncture sheath and attempted to exchange this for a 6 [place] sheath. I was unable to pass the 6 [place] sheath despite multiple attempts and elected to abort this. Pressure was held for hemostasis. I cannulated the fistula higher up the arm under ultrasound guidance after the skin was anesthetized. A micropuncture sheath was introduced over the wire and then over a Rosen wire this was exchanged for a 6 [place] sheath. I was then able to get a Teena Dunk wire through the anastomosis. I selected a 4 mm x 2 cm balloon and positioned across the anastomosis and this  was inflated to 6 atmospheres. This was then deflated and I positioned it across the valve it was further proxima and again the balloon was inflated to 6 atmospheres. The patient became increasingly short of breath at this point I did not think it was safe to give him any further contrast or fluid. The wire was removed and the balloon removed. A 4-0 Monocryl suture was placed around the sheath for hemostasis. The patient was transferred to the holding area and the nephrologist and critical care physician for consult for management of his acute congestive heart failure. He will likely need dialysis today.  FINDINGS:  1. The fistula is patent with 1 competing branches in the mid upper arm. There is no central venous stenosis. 2. There is a valve that is approximately 3 cm from the anastomosis however it did not appear to be associated with a significant stenosis. As of the valve at the anastomotic site was enema to get contrast into the native artery but the lumen this area with a small balloon. 3. I elected not to complete the study her give him further contrast given his acute congestive heart failure and shortness of breath.  CLINICAL NOTE: He will be admitted and I've consult nephrology for dialysis. If this catheter did not function well he could potentially require placement of a new catheter.   Waverly Ferrari, MD, FACS [facility]  DATE OF DICTATION:   09/28/2013

## 2013-09-28 NOTE — H&P (Signed)
[facility] of [facility]  Patient name: Timothy Banks MRN: [id number] DOB: 1956-03-16 Sex: male  REASON FOR ADMISSION: acute shortness of breath  HPI: Timothy Banks is a 57 y.o. male dialyzes in [place] on Tuesdays Thursdays and Saturdays. His hemodialysis catheter has not been working well. He had an upper arm fistula and was seen by Dr. Imogene Burn. He was noted to have 2 frozen thousand the proximal fistula and was set up for a fistulogram. During that procedure today he became acutely short of breath. It was felt that he would require dialysis today and is admitted for dialysis. I have consult with the nephrologist. I've also consult and critical care for management of his acute respiratory failure.  Past Medical History  Diagnosis Date  . Myocardial infarction   . Diabetes mellitus without complication   . CHF (congestive heart failure)   . Asthma   . Hyperlipidemia   . Hypertension   . Chronic kidney disease 02/17/2013    ACUTE RENAL  . Coronary artery disease   . Anginal pain   . Neuromuscular disorder     neuropathy in hands and feet    Family History  Problem Relation Age of Onset  . Diabetes Mother   . Heart disease Mother   . Diabetes Father   . Heart disease Father    SOCIAL HISTORY: History  Substance Use Topics  . Smoking status: Current Every Day Smoker -- 0.50 packs/day for 26 years    Types: Cigarettes    Start date: 11/05/1973    Last Attempt to Quit: 06/09/2003  . Smokeless tobacco: Never Used     Comment: quit for 10 years, started back 9 mo ago  . Alcohol Use: No   No Known Allergies  Current Facility-Administered Medications  Medication Dose Route Frequency Provider Last Rate Last Dose  . furosemide (LASIX) injection 40 mg  40 mg Intravenous Once Chuck Hint, MD      . furosemide (LASIX) injection 40 mg  40 mg Intramuscular Once Chuck Hint, MD      . morphine injection 2 mg  2 mg Intravenous Q2H PRN Chuck Hint, MD      . sodium chloride 0.9 % injection 3 mL  3 mL Intravenous PRN Chuck Hint, MD        REVIEW OF SYSTEMS: [name] ] denotes positive finding; [  ] denotes negative finding CARDIOVASCULAR:  [ ]  chest pain   [ ]  chest pressure   [ ]  palpitations   [name] ] orthopnea   [name] ] dyspnea on exertion   [ ]  claudication   [ ]  rest pain   [ ]  DVT   [ ]  phlebitis PULMONARY:   [ ]  productive cough   [ ]  asthma   [ ]  wheezing NEUROLOGIC:   [ ]  weakness  [ ]  paresthesias  [ ]  aphasia  [ ]  amaurosis  [ ]  dizziness HEMATOLOGIC:   [ ]  bleeding problems   [ ]  clotting disorders MUSCULOSKELETAL:  [ ]  joint pain   [ ]  joint swelling [ ]  leg swelling GASTROINTESTINAL: [ ]   blood in stool  [ ]   hematemesis GENITOURINARY:  [ ]   dysuria  [ ]   hematuria PSYCHIATRIC:  [ ]  history of major depression INTEGUMENTARY:  [ ]  rashes  [ ]  ulcers CONSTITUTIONAL:  [ ]  fever   [ ]  chills  PHYSICAL EXAM: Filed Vitals:   09/28/13 0855 09/28/13 1017  BP: 148/58  Pulse: 65 68  Temp: 98.1 F (36.7 C)   TempSrc: Oral   Resp: 18   Height: 5\' 7"  (1.702 m)   Weight: 330 lb (149.687 kg)   SpO2: 90%    Body mass index is 51.67 kg/(m^2). GENERAL: The patient is a well-nourished male, in no acute distress. The vital signs are documented above. CARDIOVASCULAR: There is a regular rate and rhythm.  PULMONARY: There are decreased breath sounds in both bases with rales. ABDOMEN: Soft and non-tender with normal pitched bowel sounds.  MUSCULOSKELETAL: There are no major deformities or cyanosis. NEUROLOGIC: No focal weakness or paresthesias are detected. SKIN: There are no ulcers or rashes noted. PSYCHIATRIC: The patient has a normal affect.  DATA:  Lab Results  Component Value Date   WBC 12.9* 03/14/2013   HGB 10.9* 09/28/2013   HCT 32.0* 09/28/2013   MCV 88.1 03/14/2013   PLT 223 03/14/2013   Lab Results  Component Value Date   NA 135 09/28/2013   K 3.3* 09/28/2013   CL 95* 09/28/2013   CO2 33* 03/14/2013    Lab Results  Component Value Date   CREATININE 3.10* 09/28/2013   No results found for this basename: INR, PROTIME   Lab Results  Component Value Date   HGBA1C 5.8* 03/10/2013   MEDICAL ISSUES: The patient is admitted with acute respiratory failure during his fistulogram today. He has not been getting adequate dialysis treatments because of a poorly functioning catheter. He will be admitted today for dialysis. I've also consult critical care for management of his acute respiratory failure. This catheter did not function adequately this could potentially have to be changed. Because of his acute respiratory failure we are unable to complete his study. I did balloon the 2 valve sites which were noted but did no follow up study because of his respiratory failure. His fistula this point is not ready for cannulation.  DICKSON,CHRISTOPHER S [facility] [place] Beeper: [phone]

## 2013-09-28 NOTE — Progress Notes (Signed)
Utilization Review Completed.Timothy Banks T11/24/2014  

## 2013-09-28 NOTE — Procedures (Signed)
Tolerating hemodialysis.  He has significant volume overload and needs aggressive fluid removal Aury Scollard C

## 2013-09-29 ENCOUNTER — Ambulatory Visit (HOSPITAL_COMMUNITY): Payer: Medicare Other

## 2013-09-29 LAB — BASIC METABOLIC PANEL
BUN: 15 mg/dL (ref 6–23)
CO2: 30 mEq/L (ref 19–32)
Calcium: 8.1 mg/dL — ABNORMAL LOW (ref 8.4–10.5)
Creatinine, Ser: 1.97 mg/dL — ABNORMAL HIGH (ref 0.50–1.35)
Glucose, Bld: 121 mg/dL — ABNORMAL HIGH (ref 70–99)

## 2013-09-29 LAB — GLUCOSE, CAPILLARY
Glucose-Capillary: 118 mg/dL — ABNORMAL HIGH (ref 70–99)
Glucose-Capillary: 222 mg/dL — ABNORMAL HIGH (ref 70–99)

## 2013-09-29 LAB — CBC
HCT: 31.9 % — ABNORMAL LOW (ref 39.0–52.0)
Hemoglobin: 9.8 g/dL — ABNORMAL LOW (ref 13.0–17.0)
MCH: 27.2 pg (ref 26.0–34.0)
MCV: 88.6 fL (ref 78.0–100.0)
RBC: 3.6 MIL/uL — ABNORMAL LOW (ref 4.22–5.81)

## 2013-09-29 LAB — COMPREHENSIVE METABOLIC PANEL
ALT: 6 U/L (ref 0–53)
AST: 13 U/L (ref 0–37)
Albumin: 3 g/dL — ABNORMAL LOW (ref 3.5–5.2)
CO2: 29 mEq/L (ref 19–32)
Calcium: 8.1 mg/dL — ABNORMAL LOW (ref 8.4–10.5)
Chloride: 97 mEq/L (ref 96–112)
GFR calc non Af Amer: 48 mL/min — ABNORMAL LOW (ref >90)
Potassium: 3.8 mEq/L (ref 3.5–5.1)
Sodium: 137 mEq/L (ref 135–145)
Total Bilirubin: 0.6 mg/dL (ref 0.3–1.2)

## 2013-09-29 LAB — PHOSPHORUS: Phosphorus: 3.1 mg/dL (ref 2.3–4.6)

## 2013-09-29 MED ORDER — OXYCODONE HCL 5 MG PO TABS: 15.0000 mg | ORAL_TABLET | Freq: Four times a day (QID) | ORAL | Status: DC | PRN

## 2013-09-29 MED ORDER — MORPHINE SULFATE 2 MG/ML IJ SOLN
INTRAMUSCULAR | Status: AC
  Administered 2013-09-29: 2 mg via INTRAVENOUS
  Filled 2013-09-29: qty 1

## 2013-09-29 MED ORDER — LIDOCAINE HCL (PF) 1 % IJ SOLN: 5.0000 mL | INTRAMUSCULAR | Status: DC | PRN

## 2013-09-29 MED ORDER — SODIUM CHLORIDE 0.9 % IV SOLN: 100.0000 mL | INTRAVENOUS | Status: DC | PRN

## 2013-09-29 MED ORDER — PENTAFLUOROPROP-TETRAFLUOROETH EX AERO: 1.0000 "application " | INHALATION_SPRAY | CUTANEOUS | Status: DC | PRN

## 2013-09-29 MED ORDER — ACETAMINOPHEN 325 MG PO TABS
ORAL_TABLET | ORAL | Status: AC
  Administered 2013-09-29: 650 mg
  Filled 2013-09-29: qty 2

## 2013-09-29 MED ORDER — HEPARIN SODIUM (PORCINE) 1000 UNIT/ML DIALYSIS: 20.0000 [IU]/kg | INTRAMUSCULAR | Status: DC | PRN

## 2013-09-29 MED ORDER — LIDOCAINE-PRILOCAINE 2.5-2.5 % EX CREA
1.0000 "application " | TOPICAL_CREAM | CUTANEOUS | Status: DC | PRN
  Filled 2013-09-29: qty 5

## 2013-09-29 MED ORDER — ALPRAZOLAM 0.5 MG PO TABS
0.5000 mg | ORAL_TABLET | Freq: Once | ORAL | Status: AC
  Administered 2013-09-29: 0.5 mg via ORAL

## 2013-09-29 MED ORDER — ALTEPLASE 2 MG IJ SOLR
2.0000 mg | Freq: Once | INTRAMUSCULAR | Status: DC | PRN
  Filled 2013-09-29: qty 2

## 2013-09-29 MED ORDER — MORPHINE SULFATE 4 MG/ML IJ SOLN
INTRAMUSCULAR | Status: AC
  Filled 2013-09-29: qty 1

## 2013-09-29 MED ORDER — ALPRAZOLAM 0.5 MG PO TABS
ORAL_TABLET | ORAL | Status: AC
  Administered 2013-09-29: 0.5 mg via ORAL
  Filled 2013-09-29: qty 1

## 2013-09-29 MED ORDER — NEPRO/CARBSTEADY PO LIQD
237.0000 mL | ORAL | Status: DC | PRN
  Filled 2013-09-29: qty 237

## 2013-09-29 MED ORDER — OXYCODONE HCL 5 MG PO TABS
ORAL_TABLET | ORAL | Status: AC
  Administered 2013-09-29: 15 mg via ORAL
  Filled 2013-09-29: qty 3

## 2013-09-29 MED ORDER — HEPARIN SODIUM (PORCINE) 1000 UNIT/ML DIALYSIS: 1000.0000 [IU] | INTRAMUSCULAR | Status: DC | PRN

## 2013-09-29 MED ORDER — OXYCODONE HCL 5 MG PO TABS
15.0000 mg | ORAL_TABLET | Freq: Four times a day (QID) | ORAL | Status: DC | PRN
  Administered 2013-09-29 – 2013-09-30 (×4): 15 mg via ORAL
  Filled 2013-09-29 (×3): qty 3

## 2013-09-29 NOTE — Progress Notes (Signed)
  VASCULAR AND VEIN SURGERY PROGRESS NOTE  POST-OP HEMODIALYSIS ACCESS  Date of Surgery: 09/28/2013 Surgeon: Surgeon(s): Chuck Hint, MD 1 Day Post-Op Left 1. Ultrasound-guided access to left upper arm AV fistula  2. fistulogram left upper arm AV fistula  3. venoplasty of anastomosis and a valve site of left upper arm AV fistula   HPI: Timothy Banks is a 57 y.o. male who is 1 Day Post-Op fistulogram and venoplasty LUA AVF.  The patient denies symptoms of numbness, tingling, weakness; denies pain in the operative limb.   Significant Diagnostic Studies: CBC Lab Results  Component Value Date   WBC 12.7* 09/29/2013   HGB 9.8* 09/29/2013   HCT 31.9* 09/29/2013   MCV 88.6 09/29/2013   PLT 250 09/29/2013    BMET    Component Value Date/Time   NA 137 09/29/2013 0524   K 3.3* 09/29/2013 0524   CL 97 09/29/2013 0524   CO2 30 09/29/2013 0524   GLUCOSE 121* 09/29/2013 0524   BUN 15 09/29/2013 0524   CREATININE 1.97* 09/29/2013 0524   CALCIUM 8.1* 09/29/2013 0524   GFRNONAA 36* 09/29/2013 0524   GFRAA 42* 09/29/2013 0524    COAG No results found for this basename: INR, PROTIME   No results found for this basename: PTT    Vital Signs  BP Readings from Last 3 Encounters:  09/29/13 147/52  09/29/13 147/52  09/25/13 147/55   Temp Readings from Last 3 Encounters:  09/29/13 97.3 F (36.3 C) Oral  09/29/13 97.3 F (36.3 C) Oral  07/20/13 97.1 F (36.2 C)    SpO2 Readings from Last 3 Encounters:  09/29/13 98%  09/29/13 98%  09/25/13 91%   Pulse Readings from Last 3 Encounters:  09/29/13 66  09/29/13 66  09/25/13 71     Physical Examination  left upper Incision is healing well, skin color is normal , hand grip is 5/5, sensation in digits is intact;  There is a good thrill and good bruit in the LUA AVF. 2+ radial pulse palp on left   Assessment/Plan  S/P fistulogram/venoplasty LUA AVF - doing well Acute resp failure- much improved this am after  HD Pt states he uses O2 at home PRN Pt states he takes oxy IR 15mg  q ^ hours at home for chronic back pain - will re-order and dc percocet Home when cleared by CCM and Renal   Shyniece Scripter J 09/29/2013 9:13 AM

## 2013-09-29 NOTE — Progress Notes (Signed)
Assessment/Plan:  1. CHF, improved - with 5.5liters off, but CXR still with edema 2. ESRD - TTS - HD today again an attempt to establish optimum dry weight. 3. Hypertension/volume - decrease volume 4. Anemia - Hgb 10.9; tsat low; start IV Fe -x 1; no ESA today; if not d/c can start later otherwise resume outpt dose 5. Metabolic bone disease - Not on vit D 6. Nutrition - needs high protein renal diet 7. Morbid obesity/DM  Subjective: Interval History: 5500 cc off with dialysis yesterday  Objective: Vital signs in last 24 hours: Temp:  [97.1 F (36.2 C)-98.3 F (36.8 C)] 97.3 F (36.3 C) (11/25 0754) Pulse Rate:  [65-82] 66 (11/25 0754) Resp:  [10-32] 12 (11/25 0754) BP: (106-164)/(30-108) 147/52 mmHg (11/25 0754) SpO2:  [89 %-100 %] 98 % (11/25 0754) FiO2 (%):  [50 %] 50 % (11/24 1600) Weight:  [146.1 kg (322 lb 1.5 oz)-147.9 kg (326 lb 1 oz)] 146.1 kg (322 lb 1.5 oz) (11/25 0500) Weight change:   Intake/Output from previous day: 11/24 0701 - 11/25 0700 In: 892 [P.O.:480; I.V.:112] Out: 5410 [Urine:410] Intake/Output this shift: Total I/O In: 200 [P.O.:200] Out: -   General appearance: alert and cooperative Resp: clear to auscultation bilaterally Cardio: regular rate and rhythm, S1, S2 normal, no murmur, click, rub or gallop Extremities: edema 1+  Lab Results:  Recent Labs  09/28/13 1525 09/29/13 0524  WBC 15.5* 12.7*  HGB 9.4* 9.8*  HCT 29.6* 31.9*  PLT 263 250   BMET:  Recent Labs  09/28/13 1535 09/29/13 0524  NA 132* 137  K 3.4* 3.3*  CL 93* 97  CO2 26 30  GLUCOSE 94 121*  BUN 36* 15  CREATININE 3.08* 1.97*  CALCIUM 8.0* 8.1*   No results found for this basename: PTH,  in the last 72 hours Iron Studies: No results found for this basename: IRON, TIBC, TRANSFERRIN, FERRITIN,  in the last 72 hours Studies/Results: Dg Chest Port 1 View  09/29/2013   CLINICAL DATA:  Shortness of breath, airspace disease  EXAM: PORTABLE CHEST - 1 VIEW  COMPARISON:   Portable chest x-ray of 09/28/2013  FINDINGS: There has been improvement in the airspace disease most likely representing edema. Cardiomegaly and mild pulmonary vascular congestion remain. A large bore central venous line is unchanged and cardiomegaly is stable.  IMPRESSION: Some improvement in airspace disease, most likely edema.  .   Electronically Signed   By: Dwyane Dee M.D.   On: 09/29/2013 07:56   Dg Chest Portable 1 View  09/28/2013   CLINICAL DATA:  Shortness of breath, fluid overload, dialysis patient  EXAM: PORTABLE CHEST - 1 VIEW  COMPARISON:  07/20/2013  FINDINGS: Right IJ dialysis catheter tips in the lower SVC. Prior coronary bypass changes noted. Cardiomegaly evident with increased diffuse interstitial edema pattern compatible with CHF. No large effusion or pneumothorax. Trachea midline.  IMPRESSION: Worsening CHF pattern   Electronically Signed   By: Ruel Favors M.D.   On: 09/28/2013 12:12   Scheduled: . ALPRAZolam  0.5 mg Oral QHS  . amLODipine  10 mg Oral Daily  . buPROPion  300 mg Oral q morning - 10a  . enoxaparin (LOVENOX) injection  30 mg Subcutaneous Q24H  . furosemide  160 mg Oral BID  . glipiZIDE  20 mg Oral BID AC  . hydrALAZINE  50-100 mg Oral BID  . insulin glargine  30 Units Subcutaneous QHS  . isosorbide mononitrate  30 mg Oral Daily  . metoprolol  50 mg Oral BID  . multivitamin  1 tablet Oral QHS  . pantoprazole  40 mg Oral Daily  . potassium chloride  20-40 mEq Oral Once  . simvastatin  20 mg Oral QPM  . sodium chloride  3 mL Intravenous Q12H    LOS: 1 day   Mozell Haber C 09/29/2013,9:32 AM

## 2013-09-29 NOTE — Progress Notes (Signed)
Discussed with  Dr. Lowell Guitar if the pt. would be discharged today and claimed possibly tomorrow after hemodialysis.Vasc. PA made aware, explained to the pt.

## 2013-09-29 NOTE — Progress Notes (Signed)
K+- 3.3 , MD aware.

## 2013-09-30 ENCOUNTER — Telehealth: Payer: Self-pay | Admitting: Vascular Surgery

## 2013-09-30 LAB — GLUCOSE, CAPILLARY
Glucose-Capillary: 55 mg/dL — ABNORMAL LOW (ref 70–99)
Glucose-Capillary: 56 mg/dL — ABNORMAL LOW (ref 70–99)

## 2013-09-30 MED ORDER — NEPRO/CARBSTEADY PO LIQD
237.0000 mL | ORAL | Status: DC | PRN
  Filled 2013-09-30: qty 237

## 2013-09-30 MED ORDER — SODIUM CHLORIDE 0.9 % IV SOLN: 100.0000 mL | INTRAVENOUS | Status: DC | PRN

## 2013-09-30 MED ORDER — MORPHINE SULFATE 2 MG/ML IJ SOLN
INTRAMUSCULAR | Status: AC
  Administered 2013-09-30: 2 mg via INTRAVENOUS
  Filled 2013-09-30: qty 1

## 2013-09-30 MED ORDER — ALTEPLASE 2 MG IJ SOLR
2.0000 mg | Freq: Once | INTRAMUSCULAR | Status: DC | PRN
  Filled 2013-09-30: qty 2

## 2013-09-30 MED ORDER — HEPARIN SODIUM (PORCINE) 1000 UNIT/ML DIALYSIS: 1000.0000 [IU] | INTRAMUSCULAR | Status: DC | PRN

## 2013-09-30 MED ORDER — LIDOCAINE-PRILOCAINE 2.5-2.5 % EX CREA: 1.0000 "application " | TOPICAL_CREAM | CUTANEOUS | Status: DC | PRN

## 2013-09-30 MED ORDER — HEPARIN SODIUM (PORCINE) 1000 UNIT/ML DIALYSIS: 20.0000 [IU]/kg | INTRAMUSCULAR | Status: DC | PRN

## 2013-09-30 MED ORDER — OXYCODONE HCL 5 MG PO TABS
ORAL_TABLET | ORAL | Status: AC
  Administered 2013-09-30: 15 mg
  Filled 2013-09-30: qty 3

## 2013-09-30 MED ORDER — PENTAFLUOROPROP-TETRAFLUOROETH EX AERO: 1.0000 "application " | INHALATION_SPRAY | CUTANEOUS | Status: DC | PRN

## 2013-09-30 MED ORDER — LIDOCAINE HCL (PF) 1 % IJ SOLN: 5.0000 mL | INTRAMUSCULAR | Status: DC | PRN

## 2013-09-30 MED ORDER — ALTEPLASE 100 MG IV SOLR
4.0000 mg | INTRAVENOUS | Status: AC
  Administered 2013-09-30: 4 mg
  Filled 2013-09-30: qty 4

## 2013-09-30 MED ORDER — OXYCODONE HCL 15 MG PO TABS: 15.0000 mg | ORAL_TABLET | Freq: Four times a day (QID) | ORAL | Status: DC | PRN

## 2013-09-30 NOTE — Progress Notes (Signed)
[facility] [place]  Subjective  - He wants to go home.  Pending dialysis today.   Objective 139/64 62 97.7 F (36.5 C) (Oral) 16 99%  Intake/Output Summary (Last 24 hours) at 09/30/13 0833 Last data filed at 09/30/13 0500  Gross per 24 hour  Intake   1410 ml  Output   2850 ml  Net  -1440 ml    Palpable thrill in AV Fistula. Venoplasty site healing well + ecchymosis. Palpable radial pulse.  No sign of steal syndrome.    Assessment/Planning: Procedure(s): FISTULOGRAM POSSIBLE INTERVENTION  2 Days Post-OpSurgeon(s): Chuck Hint, MD\  ESRD pending dialysis today he may be discharged home. Hypokalemia corrected 3.8 today CHF improved with dialysis no SOB at rest or with activity currently on 3 L O2 [place].  He does use O2 at home PRN.      Clinton Gallant [facility] 09/30/2013 8:33 AM --  Laboratory Lab Results:  Recent Labs  09/28/13 1525 09/29/13 0524  WBC 15.5* 12.7*  HGB 9.4* 9.8*  HCT 29.6* 31.9*  PLT 263 250   BMET  Recent Labs  09/29/13 0524 09/29/13 1610  NA 137 137  K 3.3* 3.8  CL 97 97  CO2 30 29  GLUCOSE 121* 62*  BUN 15 9  CREATININE 1.97* 1.56*  CALCIUM 8.1* 8.1*    COAG No results found for this basename: INR, PROTIME   No results found for this basename: PTT

## 2013-09-30 NOTE — Discharge Summary (Signed)
[facility]ists Discharge Summary   Patient ID:  Timothy Banks MRN: [id number] DOB/AGE: [birth year] 57 y.o.  Admit date: 09/28/2013 Discharge date: 09/30/2013 Date of Surgery: 09/28/2013 Surgeon: Surgeon(s): Chuck Hint, MD  Admission Diagnosis: ESRD  Discharge Diagnoses:  ESRD  Secondary Diagnoses: Past Medical History  Diagnosis Date  . Myocardial infarction   . Diabetes mellitus without complication   . CHF (congestive heart failure)   . Asthma   . Hyperlipidemia   . Hypertension   . Chronic kidney disease 02/17/2013    ACUTE RENAL  . Coronary artery disease   . Anginal pain   . Neuromuscular disorder     neuropathy in hands and feet    Procedure(s): FISTULOGRAM POSSIBLE INTERVENTION  Discharged Condition: stable  HPI: Timothy Banks is a 57 y.o. male dialyzes in [place] on Tuesdays Thursdays and Saturdays. His hemodialysis catheter has not been working well. He had an upper arm fistula and was seen by Dr. Imogene Burn. He was noted to have 2 frozen valves in the proximal fistula and was set up for a fistulogram. During that procedure today he became acutely short of breath. It was felt that he would require dialysis today and is admitted for dialysis. I have consult with the nephrologist. I've also consult and critical care for management of his acute respiratory failure.  Nephrology consult ordered HD on the same day starting  09/28/2013 and then for 3 days in a row.  Today dialysis with some difficulty.  He will go to his out patient dialysis at 0500 tomorrow.   CXR pre HD yesterday had edema. Activase catheter upon discharge per nephrology note.  OK to discharge home after dialysis today.       Hospital Course:  Timothy Banks is a 57 y.o. male is S/P Left Procedure(s): FISTULOGRAM POSSIBLE INTERVENTION Extubated: POD # 0 Physical exam:  left upper Incision is healing well, skin color is normal , hand grip is 5/5, sensation in digits is intact;  There is a good thrill and good bruit in the LUA AVF.  2+ radial pulse palp on left  Pt. Ambulating and taking PO diet without difficulty. Pt pain controlled with PO pain meds. Labs as below Complications:See HPI  Consults:  Treatment Team:  Lauris Poag, MD  Significant Diagnostic Studies: CBC Lab Results  Component Value Date   WBC 12.7* 09/29/2013   HGB 9.8* 09/29/2013   HCT 31.9* 09/29/2013   MCV 88.6 09/29/2013   PLT 250 09/29/2013    BMET    Component Value Date/Time   NA 137 09/29/2013 1610   K 3.8 09/29/2013 1610   CL 97 09/29/2013 1610   CO2 29 09/29/2013 1610   GLUCOSE 62* 09/29/2013 1610   BUN 9 09/29/2013 1610   CREATININE 1.56* 09/29/2013 1610   CALCIUM 8.1* 09/29/2013 1610   GFRNONAA 48* 09/29/2013 1610   GFRAA 55* 09/29/2013 1610   COAG No results found for this basename: INR, PROTIME     Disposition:  Discharge to :Home Discharge Orders   Future Orders Complete By Expires   Call MD for:  redness, tenderness, or signs of infection (pain, swelling, bleeding, redness, odor or green/yellow discharge around incision site)  As directed    Call MD for:  severe or increased pain, loss or decreased feeling  in affected limb(s)  As directed    Call MD for:  temperature >100.5  As directed    Driving Restrictions  As directed  Comments:     No driving for 1 weeks   I-Stat, Chem 8  As directed    Increase activity slowly  As directed    Comments:     Walk with assistance use walker or cane as needed   May shower   As directed    Remove dressing in 24 hours  As directed    Resume previous diet  As directed        Medication List    STOP taking these medications       oxyCODONE-acetaminophen 5-325 MG per tablet  Commonly known as:  PERCOCET/ROXICET      TAKE these medications       albuterol 108 (90 BASE) MCG/ACT inhaler  Commonly known as:  PROVENTIL HFA;VENTOLIN HFA  Inhale 2 puffs into the lungs every 6 (six) hours as needed for  wheezing or shortness of breath.     ALPRAZolam 0.5 MG tablet  Commonly known as:  XANAX  Take 0.5 mg by mouth at bedtime.     amLODipine 10 MG tablet  Commonly known as:  NORVASC  Take 10 mg by mouth daily.     buPROPion 300 MG 24 hr tablet  Commonly known as:  WELLBUTRIN XL  Take 300 mg by mouth every morning.     furosemide 80 MG tablet  Commonly known as:  LASIX  Take 2 tablets (160 mg total) by mouth 2 (two) times daily.     glipiZIDE 10 MG tablet  Commonly known as:  GLUCOTROL  Take 20 mg by mouth 2 (two) times daily before a meal.     hydrALAZINE 50 MG tablet  Commonly known as:  APRESOLINE  Take 50-100 mg by mouth 2 (two) times daily. Takes 100mg  in the morning & 50mg  at night     insulin glargine 100 UNIT/ML injection  Commonly known as:  LANTUS  Inject 30 Units into the skin at bedtime.     isosorbide mononitrate 60 MG 24 hr tablet  Commonly known as:  IMDUR  Take 30 mg by mouth daily.     metoprolol 50 MG tablet  Commonly known as:  LOPRESSOR  Take 1 tablet (50 mg total) by mouth 2 (two) times daily.     nitroGLYCERIN 0.4 MG SL tablet  Commonly known as:  NITROSTAT  Place 0.4 mg under the tongue every 5 (five) minutes as needed for chest pain. x3 doses as needed for chest pain     oxyCODONE 15 MG immediate release tablet  Commonly known as:  ROXICODONE  Take 15 mg by mouth every 6 (six) hours as needed for pain. For pain     oxyCODONE 15 MG immediate release tablet  Commonly known as:  ROXICODONE  Take 1 tablet (15 mg total) by mouth every 6 (six) hours as needed for severe pain.     simvastatin 20 MG tablet  Commonly known as:  ZOCOR  Take 20 mg by mouth every evening.       Verbal and written Discharge instructions given to the patient. Wound care per Discharge AVS     Follow-up Information   Follow up with DICKSON,CHRISTOPHER S, MD In 2 weeks. (sent)    Specialty:  Vascular Surgery   Contact information:   [address] [place]  [address] [phone]       Signed: Clinton Gallant [facility] 09/30/2013, 11:56 AM

## 2013-09-30 NOTE — Procedures (Signed)
Some difficulty with HD catheter flows currently at 200 cc/min.. Will need to activase.  Next HD as OP at 0500 therefore will shorten time today.He reports out pt EDW 145Kg.  Weight 144.5 today and 141.7 post HD yesterday. I suspect he needs an out pt EDW around 143  Kg( with shoes, etc).  CXR pre HD yesterday had edema.  Activase catheter upon discharge.   Assessment/Plan:  1. CHF, improved -  2. ESRD - TTS - HD today again an attempt to establish optimum dry weight.  OK to dc home post treatment if stable. Kraven Calk C

## 2013-09-30 NOTE — Telephone Encounter (Addendum)
Message copied by Fredrich Birks on Wed Sep 30, 2013  3:39 PM ------      Message from: Melene Plan      Created: Wed Sep 30, 2013 11:56 AM                   ----- Message -----         From: Lars Mage, PA-C         Sent: 09/30/2013  11:50 AM           To: Melene Plan, RN            F/U with Dr. Edilia Bo in 2 weeks post angiogram with post-op problems. ------  09/30/13: spoke with pt, dpm

## 2013-09-30 NOTE — Progress Notes (Signed)
Inpatient Diabetes Program Recommendations  AACE/ADA: New Consensus Statement on Inpatient Glycemic Control (2013)  Target Ranges:  Prepandial:   less than 140 mg/dL      Peak postprandial:   less than 180 mg/dL (1-2 hours)      Critically ill patients:  140 - 180 mg/dL  Results for ATANACIO, MELNYK (MRN [id number]) as of 09/30/2013 10:57  Ref. Range 09/29/2013 17:23 09/29/2013 21:33 09/30/2013 07:58 09/30/2013 08:00 09/30/2013 10:12  Glucose-Capillary Latest Range: 70-99 mg/dL 469 (H) 629 (H) 55 (L) 56 (L) 113 (H)    Inpatient Diabetes Program Recommendations Correction (SSI): start Novolog moderate scale TID Oral Agents: discontinue Glipizide  Thank you  Piedad Climes BSN, RN,CDE Inpatient Diabetes Coordinator [phone] (team pager)

## 2013-10-04 NOTE — Discharge Summary (Signed)
Agree with plans for D/C.  Waverly Ferrari, MD, FACS Beeper [phone] 10/04/2013

## 2013-10-20 ENCOUNTER — Encounter: Payer: Self-pay | Admitting: Vascular Surgery

## 2013-10-21 ENCOUNTER — Encounter: Payer: Self-pay | Admitting: Vascular Surgery

## 2013-10-21 ENCOUNTER — Ambulatory Visit (INDEPENDENT_AMBULATORY_CARE_PROVIDER_SITE_OTHER): Payer: Self-pay | Admitting: Vascular Surgery

## 2013-10-21 VITALS — BP 147/53 | HR 71 | Ht 67.0 in | Wt 326.3 lb

## 2013-10-21 DIAGNOSIS — N186 End stage renal disease: Secondary | ICD-10-CM

## 2013-10-21 NOTE — Progress Notes (Signed)
   Patient name: Timothy Banks MRN: [id number] DOB: [birth year] Sex: male  REASON FOR VISIT: Follow up after fistulogram.  HPI: Timothy Banks is a 57 y.o. male who dialyzes on Tuesdays Thursdays and Saturdays in [place]. He was noted to have 2 frozen thousand his proximal fistula on a duplex scan. He was brought in for a fistulogram on 09/28/2013. Fistulogram showed no central venous stenosis. He had a proximal stenosis which I attempted to venoplasty. However, during the procedure, he became increasing short of breath and we had to discontinue the procedure. He was admitted for acute dialysis. He comes in now for a follow up visit.  This fistula was placed by Dr. Imogene Burn on 07/20/2013. He is currently dialyzing with a right IJ tunneled dialysis catheter. He dialyzes on Tuesdays Thursdays and Saturdays. He states that he feels better since he was discharged from the hospital.  REVIEW OF SYSTEMS: [name] ] denotes positive finding; [  ] denotes negative finding  CARDIOVASCULAR:  [ ]  chest pain   [ ]  dyspnea on exertion    CONSTITUTIONAL:  [ ]  fever   [ ]  chills  PHYSICAL EXAM: Filed Vitals:   10/21/13 1200  BP: 147/53  Pulse: 71  Height: 5\' 7"  (1.702 m)  Weight: 326 lb 4.8 oz (148.009 kg)  SpO2: 96%   Body mass index is 51.09 kg/(m^2). GENERAL: The patient is a well-nourished male, in no acute distress. The vital signs are documented above. CARDIOVASCULAR: There is a regular rate and rhythm. PULMONARY: There is good air exchange bilaterally without wheezing or rales. His left upper arm AV fistula has a good bruit and thrill. The left hand is warm and well-perfused although I cannot palpate a radial pulse.  MEDICAL ISSUES:  End stage renal disease This patient has undergone venoplasty of a proximal stenosis in his left AV fistula. The fistula appears to have a good bruit and thrill and I think that it would be reasonable to begin using the fistula in early January. He has a functioning catheter  currently. Once we know that the fistula is working, we've arranged to have his catheter removed. If there are continued issues with his fistula, then we can repeat a duplex scan of the fistula.   Dariela Stoker S [facility] [place] Beeper: [phone]

## 2013-10-21 NOTE — Assessment & Plan Note (Signed)
This patient has undergone venoplasty of a proximal stenosis in his left AV fistula. The fistula appears to have a good bruit and thrill and I think that it would be reasonable to begin using the fistula in early January. He has a functioning catheter currently. Once we know that the fistula is working, we've arranged to have his catheter removed. If there are continued issues with his fistula, then we can repeat a duplex scan of the fistula.

## 2013-11-03 ENCOUNTER — Inpatient Hospital Stay (HOSPITAL_COMMUNITY)
Admission: AD | Admit: 2013-11-03 | Discharge: 2013-11-05 | DRG: 193 | Disposition: A | Discharge status: Home / Self Care | Payer: Medicare Other | Source: Other Acute Inpatient Hospital | Attending: Internal Medicine | Admitting: Internal Medicine

## 2013-11-03 DIAGNOSIS — J189 Pneumonia, unspecified organism: Principal | ICD-10-CM | POA: Diagnosis present

## 2013-11-03 DIAGNOSIS — I120 Hypertensive chronic kidney disease with stage 5 chronic kidney disease or end stage renal disease: Secondary | ICD-10-CM | POA: Diagnosis present

## 2013-11-03 DIAGNOSIS — G458 Other transient cerebral ischemic attacks and related syndromes: Secondary | ICD-10-CM

## 2013-11-03 DIAGNOSIS — I2510 Atherosclerotic heart disease of native coronary artery without angina pectoris: Secondary | ICD-10-CM | POA: Diagnosis present

## 2013-11-03 DIAGNOSIS — J441 Chronic obstructive pulmonary disease with (acute) exacerbation: Secondary | ICD-10-CM | POA: Diagnosis present

## 2013-11-03 DIAGNOSIS — Z720 Tobacco use: Secondary | ICD-10-CM | POA: Diagnosis present

## 2013-11-03 DIAGNOSIS — J9600 Acute respiratory failure, unspecified whether with hypoxia or hypercapnia: Secondary | ICD-10-CM

## 2013-11-03 DIAGNOSIS — E1165 Type 2 diabetes mellitus with hyperglycemia: Secondary | ICD-10-CM

## 2013-11-03 DIAGNOSIS — I509 Heart failure, unspecified: Secondary | ICD-10-CM | POA: Diagnosis present

## 2013-11-03 DIAGNOSIS — Z951 Presence of aortocoronary bypass graft: Secondary | ICD-10-CM

## 2013-11-03 DIAGNOSIS — N179 Acute kidney failure, unspecified: Secondary | ICD-10-CM

## 2013-11-03 DIAGNOSIS — E785 Hyperlipidemia, unspecified: Secondary | ICD-10-CM | POA: Diagnosis present

## 2013-11-03 DIAGNOSIS — M545 Low back pain: Secondary | ICD-10-CM

## 2013-11-03 DIAGNOSIS — J811 Chronic pulmonary edema: Secondary | ICD-10-CM

## 2013-11-03 DIAGNOSIS — E876 Hypokalemia: Secondary | ICD-10-CM

## 2013-11-03 DIAGNOSIS — M79604 Pain in right leg: Secondary | ICD-10-CM | POA: Diagnosis present

## 2013-11-03 DIAGNOSIS — G709 Myoneural disorder, unspecified: Secondary | ICD-10-CM | POA: Diagnosis present

## 2013-11-03 DIAGNOSIS — Z9111 Patient's noncompliance with dietary regimen: Secondary | ICD-10-CM

## 2013-11-03 DIAGNOSIS — Z992 Dependence on renal dialysis: Secondary | ICD-10-CM

## 2013-11-03 DIAGNOSIS — D649 Anemia, unspecified: Secondary | ICD-10-CM | POA: Diagnosis present

## 2013-11-03 DIAGNOSIS — I252 Old myocardial infarction: Secondary | ICD-10-CM

## 2013-11-03 DIAGNOSIS — F17200 Nicotine dependence, unspecified, uncomplicated: Secondary | ICD-10-CM | POA: Diagnosis present

## 2013-11-03 DIAGNOSIS — J9601 Acute respiratory failure with hypoxia: Secondary | ICD-10-CM

## 2013-11-03 DIAGNOSIS — Z79899 Other long term (current) drug therapy: Secondary | ICD-10-CM

## 2013-11-03 DIAGNOSIS — Z6841 Body Mass Index (BMI) 40.0 and over, adult: Secondary | ICD-10-CM

## 2013-11-03 DIAGNOSIS — IMO0002 Reserved for concepts with insufficient information to code with codable children: Secondary | ICD-10-CM | POA: Diagnosis present

## 2013-11-03 DIAGNOSIS — D72829 Elevated white blood cell count, unspecified: Secondary | ICD-10-CM

## 2013-11-03 DIAGNOSIS — Z794 Long term (current) use of insulin: Secondary | ICD-10-CM

## 2013-11-03 DIAGNOSIS — N184 Chronic kidney disease, stage 4 (severe): Secondary | ICD-10-CM

## 2013-11-03 DIAGNOSIS — M899 Disorder of bone, unspecified: Secondary | ICD-10-CM | POA: Diagnosis present

## 2013-11-03 DIAGNOSIS — R200 Anesthesia of skin: Secondary | ICD-10-CM

## 2013-11-03 DIAGNOSIS — N2581 Secondary hyperparathyroidism of renal origin: Secondary | ICD-10-CM | POA: Diagnosis present

## 2013-11-03 DIAGNOSIS — E8770 Fluid overload, unspecified: Secondary | ICD-10-CM

## 2013-11-03 DIAGNOSIS — E119 Type 2 diabetes mellitus without complications: Secondary | ICD-10-CM

## 2013-11-03 DIAGNOSIS — N186 End stage renal disease: Secondary | ICD-10-CM | POA: Diagnosis present

## 2013-11-03 DIAGNOSIS — J9610 Chronic respiratory failure, unspecified whether with hypoxia or hypercapnia: Secondary | ICD-10-CM | POA: Diagnosis present

## 2013-11-03 LAB — GLUCOSE, CAPILLARY: Glucose-Capillary: 155 mg/dL — ABNORMAL HIGH (ref 70–99)

## 2013-11-03 MED ORDER — OXYCODONE HCL 5 MG PO TABS: 15.0000 mg | ORAL_TABLET | Freq: Four times a day (QID) | ORAL | Status: DC | PRN

## 2013-11-03 MED ORDER — ALBUTEROL SULFATE HFA 108 (90 BASE) MCG/ACT IN AERS
2.0000 | INHALATION_SPRAY | Freq: Four times a day (QID) | RESPIRATORY_TRACT | Status: DC | PRN
Start: 2013-11-03 — End: 2013-11-05

## 2013-11-03 MED ORDER — DEXTROSE 5 % IV SOLN
500.0000 mg | INTRAVENOUS | Status: DC
  Administered 2013-11-03: 500 mg via INTRAVENOUS
  Filled 2013-11-03 (×2): qty 0.5

## 2013-11-03 MED ORDER — DEXTROSE 5 % IV SOLN
1.0000 g | Freq: Three times a day (TID) | INTRAVENOUS | Status: DC
  Filled 2013-11-03 (×2): qty 1

## 2013-11-03 MED ORDER — VANCOMYCIN HCL 10 G IV SOLR
2500.0000 mg | Freq: Once | INTRAVENOUS | Status: AC
  Administered 2013-11-03: 2500 mg via INTRAVENOUS
  Filled 2013-11-03: qty 2500

## 2013-11-03 MED ORDER — ISOSORBIDE MONONITRATE ER 30 MG PO TB24
30.0000 mg | ORAL_TABLET | Freq: Every day | ORAL | Status: DC
  Administered 2013-11-04 – 2013-11-05 (×2): 30 mg via ORAL
  Filled 2013-11-03 (×2): qty 1

## 2013-11-03 MED ORDER — INSULIN GLARGINE 100 UNIT/ML ~~LOC~~ SOLN
30.0000 [IU] | Freq: Every day | SUBCUTANEOUS | Status: DC
  Administered 2013-11-03 – 2013-11-04 (×2): 30 [IU] via SUBCUTANEOUS
  Filled 2013-11-03 (×3): qty 0.3

## 2013-11-03 MED ORDER — NITROGLYCERIN 0.4 MG SL SUBL: 0.4000 mg | SUBLINGUAL_TABLET | SUBLINGUAL | Status: DC | PRN

## 2013-11-03 MED ORDER — SIMVASTATIN 20 MG PO TABS
20.0000 mg | ORAL_TABLET | Freq: Every evening | ORAL | Status: DC
  Administered 2013-11-03 – 2013-11-04 (×2): 20 mg via ORAL
  Filled 2013-11-03 (×3): qty 1

## 2013-11-03 MED ORDER — AMLODIPINE BESYLATE 10 MG PO TABS
10.0000 mg | ORAL_TABLET | Freq: Every day | ORAL | Status: DC
  Administered 2013-11-04 – 2013-11-05 (×2): 10 mg via ORAL
  Filled 2013-11-03 (×2): qty 1

## 2013-11-03 MED ORDER — HEPARIN SODIUM (PORCINE) 5000 UNIT/ML IJ SOLN
5000.0000 [IU] | Freq: Three times a day (TID) | INTRAMUSCULAR | Status: DC
  Administered 2013-11-03 – 2013-11-05 (×5): 5000 [IU] via SUBCUTANEOUS
  Filled 2013-11-03 (×8): qty 1

## 2013-11-03 MED ORDER — HYDRALAZINE HCL 50 MG PO TABS
50.0000 mg | ORAL_TABLET | Freq: Two times a day (BID) | ORAL | Status: DC
  Administered 2013-11-03 – 2013-11-05 (×4): 50 mg via ORAL
  Filled 2013-11-03 (×6): qty 1

## 2013-11-03 MED ORDER — BUPROPION HCL ER (XL) 300 MG PO TB24
300.0000 mg | ORAL_TABLET | Freq: Every morning | ORAL | Status: DC
  Administered 2013-11-04 – 2013-11-05 (×2): 300 mg via ORAL
  Filled 2013-11-03 (×2): qty 1

## 2013-11-03 MED ORDER — ALPRAZOLAM 0.5 MG PO TABS
0.5000 mg | ORAL_TABLET | Freq: Every day | ORAL | Status: DC
  Administered 2013-11-03 – 2013-11-04 (×2): 0.5 mg via ORAL
  Filled 2013-11-03 (×2): qty 1

## 2013-11-03 MED ORDER — FUROSEMIDE 80 MG PO TABS
160.0000 mg | ORAL_TABLET | Freq: Two times a day (BID) | ORAL | Status: DC
  Filled 2013-11-03 (×3): qty 2

## 2013-11-03 MED ORDER — METOPROLOL TARTRATE 50 MG PO TABS
50.0000 mg | ORAL_TABLET | Freq: Two times a day (BID) | ORAL | Status: DC
  Administered 2013-11-03 – 2013-11-05 (×4): 50 mg via ORAL
  Filled 2013-11-03 (×5): qty 1

## 2013-11-03 MED ORDER — METHYLPREDNISOLONE SODIUM SUCC 125 MG IJ SOLR
60.0000 mg | Freq: Two times a day (BID) | INTRAMUSCULAR | Status: DC
  Administered 2013-11-03: 60 mg via INTRAVENOUS
  Filled 2013-11-03 (×3): qty 0.96

## 2013-11-03 MED ORDER — INSULIN ASPART 100 UNIT/ML ~~LOC~~ SOLN
0.0000 [IU] | Freq: Three times a day (TID) | SUBCUTANEOUS | Status: DC
  Administered 2013-11-04: 2 [IU] via SUBCUTANEOUS
  Administered 2013-11-04: 5 [IU] via SUBCUTANEOUS
  Administered 2013-11-04: 15 [IU] via SUBCUTANEOUS
  Administered 2013-11-05 (×2): 8 [IU] via SUBCUTANEOUS

## 2013-11-03 MED ORDER — INSULIN ASPART 100 UNIT/ML ~~LOC~~ SOLN
0.0000 [IU] | Freq: Every day | SUBCUTANEOUS | Status: DC
  Administered 2013-11-04: 3 [IU] via SUBCUTANEOUS

## 2013-11-03 MED ORDER — IPRATROPIUM-ALBUTEROL 0.5-2.5 (3) MG/3ML IN SOLN
3.0000 mL | RESPIRATORY_TRACT | Status: DC
  Administered 2013-11-03 – 2013-11-05 (×7): 3 mL via RESPIRATORY_TRACT
  Filled 2013-11-03 (×48): qty 3

## 2013-11-03 NOTE — H&P (Addendum)
[facility] History and Physical  Patient: Timothy Banks  FAO:[id number]  DOB: 05-Sep-1956  DOS: the patient was seen and examined on 11/03/2013 PCP: Anselmo Pickler, MD  Chief Complaint: Cough and shortness of breath  HPI: Timothy Banks is a 57 y.o. male with Past medical history of coronary artery disease, CHF, diabetes mellitus, active smoker. The patient is coming from home. The patient is presenting with a complaint of cough with shortness of breath which has been progressively worsening since last one week. He denies any sick contact or travel or recent immobilization or surgery. In mentions he started having cough with runny nose he was bringing up light yellow phlegm without any blood and then the shortness of breath started progressively worsened to the extent that he was short of breath even at rest or for which he came to the hospital. He also complains of some pleuritic component of chest pain located in the lower rib region bilaterally. He denies any nausea or vomiting abdominal pain diarrhea or constipation orthopnea or PND. He does have some leg swellings which he mentions is about his baseline. He denies any signs of aspiration.  Review of Systems: as mentioned in the history of present illness.  A Comprehensive review of the other systems is negative.  Past Medical History  Diagnosis Date  . Myocardial infarction   . Diabetes mellitus without complication   . CHF (congestive heart failure)   . Asthma   . Hyperlipidemia   . Hypertension   . Chronic kidney disease 02/17/2013    ACUTE RENAL  . Coronary artery disease   . Anginal pain   . Neuromuscular disorder     neuropathy in hands and feet   Past Surgical History  Procedure Laterality Date  . Right carotid endarterectomy  2002    right side  . Coronary artery bypass graft  2002  . Av fistula placement Right 02/25/2013    Procedure: ARTERIOVENOUS (AV) FISTULA CREATION;  Surgeon: Sherren Kerns, MD;   Location: [facility] OR;  Service: Vascular;  Laterality: Right;  Right Radiocephalic AVF  . Insertion of dialysis catheter Right 03/12/2013    Procedure: ultrasound guided INSERTION OF DIALYSIS CATHETER right internal jugular using arrow 23cm;  Surgeon: Chuck Hint, MD;  Location: [facility] OR;  Service: Vascular;  Laterality: Right;  . Ligation of arteriovenous  fistula Right 04/27/2013    Procedure: LIGATION OF RIGHT RADIAL CEPHALIC ARTERIOVENOUS  FISTULA;  Surgeon: Fransisco Hertz, MD;  Location: [facility] OR;  Service: Vascular;  Laterality: Right;  . Eye surgery      cataract surgery both eyes  . Removal of a dialysis catheter Right 06/10/2013    Procedure: REMOVAL OF RIGHT INTERNAL JUGULAR TUNNELED DIALYSIS CATHETER;  Surgeon: Larina Earthly, MD;  Location: [facility] OR;  Service: Vascular;  Laterality: Right;  . Insertion of dialysis catheter Left 06/10/2013    Procedure: INSERTION OF LEFT INTERNAL JUGULAR TUNNELED DIALYSIS CATHETER;  Surgeon: Larina Earthly, MD;  Location: [facility] OR;  Service: Vascular;  Laterality: Left;  . Removal of a dialysis catheter Left 07/20/2013    Procedure: REMOVAL OF A DIALYSIS CATHETER;  Surgeon: Fransisco Hertz, MD;  Location: [facility] OR;  Service: Vascular;  Laterality: Left;  . Insertion of dialysis catheter Right 07/20/2013    Procedure: INSERTION OF DIALYSIS CATHETER, Right Internal Jugular;  Surgeon: Fransisco Hertz, MD;  Location: [facility] OR;  Service: Vascular;  Laterality: Right;  . Av fistula placement Left 07/20/2013  Procedure: ARTERIOVENOUS (AV) FISTULA CREATION;  Surgeon: Fransisco Hertz, MD;  Location: [facility] OR;  Service: Vascular;  Laterality: Left;   Social History:  reports that he has been smoking Cigarettes.  He started smoking about 40 years ago. He has a 13 pack-year smoking history. He has never used smokeless tobacco. He reports that he does not drink alcohol or use illicit drugs. Independent for most of his  ADL.  No Known Allergies  Family History  Problem Relation Age of Onset  .  Diabetes Mother   . Heart disease Mother   . Diabetes Father   . Heart disease Father     Prior to Admission medications   Medication Sig Start Date End Date Taking? Authorizing Provider  albuterol (PROVENTIL HFA;VENTOLIN HFA) 108 (90 BASE) MCG/ACT inhaler Inhale 2 puffs into the lungs every 6 (six) hours as needed for wheezing or shortness of breath.    Historical Provider, MD  ALPRAZolam Prudy Feeler) 0.5 MG tablet Take 0.5 mg by mouth at bedtime.    Historical Provider, MD  amLODipine (NORVASC) 10 MG tablet Take 10 mg by mouth daily.    Historical Provider, MD  buPROPion (WELLBUTRIN XL) 300 MG 24 hr tablet Take 300 mg by mouth every morning.    Historical Provider, MD  furosemide (LASIX) 80 MG tablet Take 2 tablets (160 mg total) by mouth 2 (two) times daily. 03/06/13   Timothy Pia, MD  glipiZIDE (GLUCOTROL) 10 MG tablet Take 20 mg by mouth 2 (two) times daily before a meal.    Historical Provider, MD  hydrALAZINE (APRESOLINE) 50 MG tablet Take 50-100 mg by mouth 2 (two) times daily. Takes 100mg  in the morning & 50mg  at night    Historical Provider, MD  insulin glargine (LANTUS) 100 UNIT/ML injection Inject 30 Units into the skin at bedtime.    Historical Provider, MD  isosorbide mononitrate (IMDUR) 60 MG 24 hr tablet Take 30 mg by mouth daily.    Historical Provider, MD  metoprolol (LOPRESSOR) 50 MG tablet Take 1 tablet (50 mg total) by mouth 2 (two) times daily. 02/23/13   Timothy Pia, MD  nitroGLYCERIN (NITROSTAT) 0.4 MG SL tablet Place 0.4 mg under the tongue every 5 (five) minutes as needed for chest pain. x3 doses as needed for chest pain    Historical Provider, MD  oxyCODONE (ROXICODONE) 15 MG immediate release tablet Take 20 mg by mouth every 6 (six) hours as needed for pain. For pain 06/10/13   Lars Mage, PA-C  oxyCODONE (ROXICODONE) 15 MG immediate release tablet Take 1 tablet (15 mg total) by mouth every 6 (six) hours as needed for severe pain. 09/30/13   Lars Mage, PA-C   simvastatin (ZOCOR) 20 MG tablet Take 20 mg by mouth every evening.    Eugenia Pancoast    Physical Exam: Filed Vitals:   11/03/13 2124  BP: 151/61  Pulse: 76  Temp: 98.4 F (36.9 C)  TempSrc: Oral  Resp: 21  Height: 5\' 7"  (1.702 m)  Weight: 141.4 kg (311 lb 11.7 oz)  SpO2: 92%    General: Alert, Awake and Oriented to Time, Place and Person. Appear in moderate distress Eyes: PERRL ENT: Oral Mucosa clear moist. Neck: No JVD Cardiovascular: S1 and S2 Present, no Murmur, Peripheral Pulses Present Respiratory: Bilateral Air entry equal and Decreased, right-sided Crackles, extensive expiratory wheezes Abdomen: Bowel Sound Present, Soft and Non tender Skin: No Rash Extremities:  trace Pedal edema, no calf tenderness Neurologic: Grossly Unremarkable.  Labs  on Admission:   Chest x-ray right atelectasis versus infiltrate Cbc wbc 13, hb 10 Sodium 130 Creatinine 3.5 Troponin negative, Influenza negative  CBC: No results found for this basename: WBC, NEUTROABS, HGB, HCT, MCV, PLT,  in the last 168 hours  CMP     Component Value Date/Time   NA 137 09/29/2013 1610   K 3.8 09/29/2013 1610   CL 97 09/29/2013 1610   CO2 29 09/29/2013 1610   GLUCOSE 62* 09/29/2013 1610   BUN 9 09/29/2013 1610   CREATININE 1.56* 09/29/2013 1610   CALCIUM 8.1* 09/29/2013 1610   PROT 8.3 09/29/2013 1610   ALBUMIN 3.0* 09/29/2013 1610   AST 13 09/29/2013 1610   ALT 6 09/29/2013 1610   ALKPHOS 76 09/29/2013 1610   BILITOT 0.6 09/29/2013 1610   GFRNONAA 48* 09/29/2013 1610   GFRAA 55* 09/29/2013 1610    No results found for this basename: LIPASE, AMYLASE,  in the last 168 hours No results found for this basename: AMMONIA,  in the last 168 hours  No results found for this basename: CKTOTAL, CKMB, CKMBINDEX, TROPONINI,  in the last 168 hours BNP (last 3 results)  Recent Labs  03/02/13 2031 09/28/13 1525  PROBNP 12262.0* 8143.0*    Radiological Exams on Admission: No results  found.  EKG: Independently reviewed. normal sinus rhythm, nonspecific ST and T waves changes.  Assessment/Plan Principal Problem:   HCAP (healthcare-associated pneumonia) Active Problems:   Type II or unspecified type diabetes mellitus with unspecified complication, uncontrolled   Morbid obesity   ESRD on dialysis   Coronary artery disease   1. HCAP (healthcare-associated pneumonia) The patient is presenting with complaints of cough shortness of breath and chest pain His chest x-ray is suggestive of possible infiltrate on the right side He has mild leukocytosis mild fever and active smoking history. He has been undergoing dialysis since last 7 months on a regular basis and is due for dialysis tomorrow. With this he will be treated for healthcare associated pneumonia With IV vancomycin and IV cefepime. Blood cultures will be drawn. Urine cultures and urine antigens as well as sputum cultures will also be obtained. Considering his extensive wheezing I would put him on Solu-Medrol as well as duo nebs. His influenza PCR is negative.  2. Diabetes mellitus Continue him on sliding scale and diabetic diet  3. Hypertension Continuing his home antihypertensive medications including amlodipine, hydralazine, Lasix  4. ESRD on hemodialysis Would continue him on hemodialysis Will consult nephro  Consults: Nephrology for dialysis  DVT Prophylaxis: subcutaneous Heparin Nutrition: Cardiac and diabetic diet with renal modification  Code Status: Full  Disposition: Admitted to inpatient in step-down unit.  Author: Lynden Oxford, MD [facility] Pager: [phone] 11/03/2013, 9:46 PM    If 7PM-7AM, please contact night-coverage [email or web address] Password [email or web address]

## 2013-11-03 NOTE — Progress Notes (Signed)
ANTIBIOTIC CONSULT NOTE - INITIAL  Pharmacy Consult for Vancomycin Indication: rule out pneumonia  No Known Allergies  Patient Measurements: Height: 5\' 7"  (170.2 cm) Weight: 311 lb 11.7 oz (141.4 kg) IBW/kg (Calculated) : 66.1   Vital Signs: Temp: 98.4 F (36.9 C) (12/30 2124) Temp src: Oral (12/30 2124) BP: 151/61 mmHg (12/30 2124) Pulse Rate: 76 (12/30 2124) Intake/Output from previous day:   Intake/Output from this shift:    Labs: No results found for this basename: WBC, HGB, PLT, LABCREA, CREATININE,  in the last 72 hours Estimated Creatinine Clearance: 71.1 ml/min (by C-G formula based on Cr of 1.56). No results found for this basename: VANCOTROUGH, VANCOPEAK, VANCORANDOM, GENTTROUGH, GENTPEAK, GENTRANDOM, TOBRATROUGH, TOBRAPEAK, TOBRARND, AMIKACINPEAK, AMIKACINTROU, AMIKACIN,  in the last 72 hours   Microbiology: No results found for this or any previous visit (from the past 720 hour(s)).  Medical History: Past Medical History  Diagnosis Date  . Myocardial infarction   . Diabetes mellitus without complication   . CHF (congestive heart failure)   . Asthma   . Hyperlipidemia   . Hypertension   . Chronic kidney disease 02/17/2013    ACUTE RENAL  . Coronary artery disease   . Anginal pain   . Neuromuscular disorder     neuropathy in hands and feet    Medications:  Prescriptions prior to admission  Medication Sig Dispense Refill  . albuterol (PROVENTIL HFA;VENTOLIN HFA) 108 (90 BASE) MCG/ACT inhaler Inhale 2 puffs into the lungs every 6 (six) hours as needed for wheezing or shortness of breath.      . ALPRAZolam (XANAX) 0.5 MG tablet Take 0.5 mg by mouth at bedtime.      Marland Kitchen amLODipine (NORVASC) 10 MG tablet Take 10 mg by mouth daily.      Marland Kitchen buPROPion (WELLBUTRIN XL) 300 MG 24 hr tablet Take 300 mg by mouth every morning.      . furosemide (LASIX) 80 MG tablet Take 2 tablets (160 mg total) by mouth 2 (two) times daily.  60 tablet  0  . glipiZIDE (GLUCOTROL) 10  MG tablet Take 20 mg by mouth 2 (two) times daily before a meal.      . hydrALAZINE (APRESOLINE) 50 MG tablet Take 50-100 mg by mouth 2 (two) times daily. Takes 100mg  in the morning & 50mg  at night      . insulin glargine (LANTUS) 100 UNIT/ML injection Inject 30 Units into the skin at bedtime.      . isosorbide mononitrate (IMDUR) 60 MG 24 hr tablet Take 30 mg by mouth daily.      . metoprolol (LOPRESSOR) 50 MG tablet Take 1 tablet (50 mg total) by mouth 2 (two) times daily.  60 tablet  0  . nitroGLYCERIN (NITROSTAT) 0.4 MG SL tablet Place 0.4 mg under the tongue every 5 (five) minutes as needed for chest pain. x3 doses as needed for chest pain      . oxyCODONE (ROXICODONE) 15 MG immediate release tablet Take 20 mg by mouth every 6 (six) hours as needed for pain. For pain      . oxyCODONE (ROXICODONE) 15 MG immediate release tablet Take 1 tablet (15 mg total) by mouth every 6 (six) hours as needed for severe pain.  30 tablet  0  . simvastatin (ZOCOR) 20 MG tablet Take 20 mg by mouth every evening.       Assessment: 57 yo F admitted 11/03/2013 to start broad spectrum antibiotics.  PMH: HD TTS, CAD, DM, CHF, hyperlipidemia, HTN,  Goal of Therapy:  Vancomycin trough level 15-20 mcg/ml  Plan:  1. Vancomycin 2500 mg IV x 1 now then follow up HD schedule for further. 2. Cefepime 500 mg IV q24h 3. Follow up SCr, UOP, cultures, clinical course and adjust as clinically indicated.   Thank you for allowing pharmacy to be a part of this patients care team.  Lovenia Kim Pharm.D., BCPS Clinical Pharmacist 11/03/2013 10:36 PM Pager: [phone] Phone: [phone]

## 2013-11-04 DIAGNOSIS — I2510 Atherosclerotic heart disease of native coronary artery without angina pectoris: Secondary | ICD-10-CM | POA: Diagnosis present

## 2013-11-04 DIAGNOSIS — E119 Type 2 diabetes mellitus without complications: Secondary | ICD-10-CM | POA: Insufficient documentation

## 2013-11-04 DIAGNOSIS — E1165 Type 2 diabetes mellitus with hyperglycemia: Secondary | ICD-10-CM

## 2013-11-04 DIAGNOSIS — F17200 Nicotine dependence, unspecified, uncomplicated: Secondary | ICD-10-CM

## 2013-11-04 DIAGNOSIS — J189 Pneumonia, unspecified organism: Secondary | ICD-10-CM | POA: Diagnosis present

## 2013-11-04 DIAGNOSIS — Z720 Tobacco use: Secondary | ICD-10-CM | POA: Diagnosis present

## 2013-11-04 DIAGNOSIS — M79609 Pain in unspecified limb: Secondary | ICD-10-CM

## 2013-11-04 DIAGNOSIS — J441 Chronic obstructive pulmonary disease with (acute) exacerbation: Secondary | ICD-10-CM

## 2013-11-04 LAB — COMPREHENSIVE METABOLIC PANEL
ALT: 23 U/L (ref 0–53)
AST: 29 U/L (ref 0–37)
Albumin: 2.8 g/dL — ABNORMAL LOW (ref 3.5–5.2)
Alkaline Phosphatase: 74 U/L (ref 39–117)
BUN: 46 mg/dL — ABNORMAL HIGH (ref 6–23)
CO2: 23 mEq/L (ref 19–32)
Calcium: 8.1 mg/dL — ABNORMAL LOW (ref 8.4–10.5)
Chloride: 90 mEq/L — ABNORMAL LOW (ref 96–112)
Creatinine, Ser: 3.42 mg/dL — ABNORMAL HIGH (ref 0.50–1.35)
GFR calc Af Amer: 21 mL/min — ABNORMAL LOW (ref >90)
GFR calc non Af Amer: 18 mL/min — ABNORMAL LOW (ref >90)
Glucose, Bld: 169 mg/dL — ABNORMAL HIGH (ref 70–99)
Potassium: 4.4 mEq/L (ref 3.7–5.3)
Sodium: 129 mEq/L — ABNORMAL LOW (ref 137–147)
Total Bilirubin: 0.5 mg/dL (ref 0.3–1.2)
Total Protein: 8.5 g/dL — ABNORMAL HIGH (ref 6.0–8.3)

## 2013-11-04 LAB — STREP PNEUMONIAE URINARY ANTIGEN: Strep Pneumo Urinary Antigen: NEGATIVE

## 2013-11-04 LAB — CBC WITH DIFFERENTIAL/PLATELET
Basophils Absolute: 0 10*3/uL (ref 0.0–0.1)
Basophils Relative: 0 % (ref 0–1)
Eosinophils Absolute: 0.3 10*3/uL (ref 0.0–0.7)
Eosinophils Relative: 2 % (ref 0–5)
MCH: 26.7 pg (ref 26.0–34.0)
MCHC: 32.1 g/dL (ref 30.0–36.0)
MCV: 83.2 fL (ref 78.0–100.0)
Neutrophils Relative %: 92 % — ABNORMAL HIGH (ref 43–77)
Platelets: 294 10*3/uL (ref 150–400)
RBC: 3.75 MIL/uL — ABNORMAL LOW (ref 4.22–5.81)
RDW: 15.7 % — ABNORMAL HIGH (ref 11.5–15.5)

## 2013-11-04 LAB — HEPATITIS B SURFACE ANTIGEN: Hepatitis B Surface Ag: NEGATIVE

## 2013-11-04 LAB — MRSA PCR SCREENING: MRSA by PCR: NEGATIVE

## 2013-11-04 LAB — GLUCOSE, CAPILLARY
Glucose-Capillary: 229 mg/dL — ABNORMAL HIGH (ref 70–99)
Glucose-Capillary: 134 mg/dL — ABNORMAL HIGH (ref 70–99)

## 2013-11-04 MED ORDER — SODIUM CHLORIDE 0.9 % IV SOLN
62.5000 mg | INTRAVENOUS | Status: DC
  Administered 2013-11-04: 62.5 mg via INTRAVENOUS
  Filled 2013-11-04: qty 5

## 2013-11-04 MED ORDER — DARBEPOETIN ALFA-POLYSORBATE 60 MCG/0.3ML IJ SOLN
60.0000 ug | INTRAMUSCULAR | Status: DC
  Administered 2013-11-04: 60 ug via INTRAVENOUS
  Filled 2013-11-04: qty 0.3

## 2013-11-04 MED ORDER — RENA-VITE PO TABS
1.0000 | ORAL_TABLET | Freq: Every day | ORAL | Status: DC
  Administered 2013-11-04: 1 via ORAL
  Filled 2013-11-04 (×2): qty 1

## 2013-11-04 MED ORDER — PREDNISONE 20 MG PO TABS
40.0000 mg | ORAL_TABLET | Freq: Every day | ORAL | Status: DC
  Administered 2013-11-05: 40 mg via ORAL
  Filled 2013-11-04 (×2): qty 2

## 2013-11-04 MED ORDER — NICOTINE 21 MG/24HR TD PT24
21.0000 mg | MEDICATED_PATCH | Freq: Every day | TRANSDERMAL | Status: DC
  Administered 2013-11-04 – 2013-11-05 (×2): 21 mg via TRANSDERMAL
  Filled 2013-11-04 (×2): qty 1

## 2013-11-04 MED ORDER — VANCOMYCIN HCL IN DEXTROSE 1-5 GM/200ML-% IV SOLN
1000.0000 mg | Freq: Once | INTRAVENOUS | Status: AC
  Administered 2013-11-04: 1000 mg via INTRAVENOUS
  Filled 2013-11-04 (×2): qty 200

## 2013-11-04 MED ORDER — DEXTROSE 5 % IV SOLN
1.0000 g | INTRAVENOUS | Status: DC
  Administered 2013-11-04: 1 g via INTRAVENOUS
  Filled 2013-11-04 (×2): qty 1

## 2013-11-04 MED ORDER — DARBEPOETIN ALFA-POLYSORBATE 60 MCG/0.3ML IJ SOLN
INTRAMUSCULAR | Status: AC
  Administered 2013-11-04: 60 ug via INTRAVENOUS
  Filled 2013-11-04: qty 0.3

## 2013-11-04 MED ORDER — DM-GUAIFENESIN ER 30-600 MG PO TB12
1.0000 | ORAL_TABLET | Freq: Two times a day (BID) | ORAL | Status: DC
  Administered 2013-11-04 – 2013-11-05 (×2): 1 via ORAL
  Filled 2013-11-04 (×3): qty 1

## 2013-11-04 MED ORDER — OXYCODONE HCL 5 MG PO TABS
20.0000 mg | ORAL_TABLET | Freq: Four times a day (QID) | ORAL | Status: DC
  Administered 2013-11-04 – 2013-11-05 (×3): 20 mg via ORAL
  Filled 2013-11-04 (×3): qty 4

## 2013-11-04 NOTE — Progress Notes (Signed)
Utilization Review Completed.Timothy Banks T12/31/2014  

## 2013-11-04 NOTE — Procedures (Signed)
I was present at this dialysis session, have reviewed the session itself and made  appropriate changes  Vinson Moselle MD (pgr) [phone]    ([phone] 11/04/2013, 12:18 PM

## 2013-11-04 NOTE — Progress Notes (Signed)
Report was given to Al in HD for pt, nurse confirmed that pt received 3 BP med as scheduled for 1000 (HD order had not been written or communicated with nurse at this time).  Pt is alert and oriented prior to transport.  Vital signs are documented prior to transport.

## 2013-11-04 NOTE — Progress Notes (Signed)
[facility] PROGRESS NOTE  Timothy Banks ZOX:[id number] DOB: [birth year] DOA: 11/03/2013 PCP: Anselmo Pickler, MD  Chart reviewed  Assessment/Plan:  Principal Problem:   HCAP (healthcare-associated pneumonia) Active Problems:   Bilateral leg and foot pain   Type II or unspecified type diabetes mellitus with unspecified complication, uncontrolled   Morbid obesity   ESRD on dialysis   Coronary artery disease   Tobacco abuse   COPD exacerbation chronic respiratory failure  Move to floor.  Taper steroids. Add nicotine patch. Schedule pain meds per patient request.   Code Status:  full Family Communication:   Disposition Plan:  home   HPI/Subjective:  "can I go home tomorrow?" c/o chronic foot pain. Wants to quit smoking.  Feeling better  Objective: Filed Vitals:   11/04/13 2134  BP: 148/58  Pulse: 81  Temp:   Resp:     Intake/Output Summary (Last 24 hours) at 11/04/13 2232 Last data filed at 11/04/13 1700  Gross per 24 hour  Intake   1490 ml  Output   2607 ml  Net  -1117 ml   Filed Weights   11/04/13 0500 11/04/13 1148 11/04/13 1655  Weight: 141 kg (310 lb 13.6 oz) 144.2 kg (317 lb 14.5 oz) 141.9 kg (312 lb 13.3 oz)    Exam:   General:  comfortable  Cardiovascular: RRR without MGR  Respiratory: diminished throughout without WRR  Abdomen: S, NT, ND  Ext: no cce  Basic Metabolic Panel:  Recent Labs Lab 11/04/13 0414  NA 129*  K 4.4  CL 90*  CO2 23  GLUCOSE 169*  BUN 46*  CREATININE 3.42*  CALCIUM 8.1*   Liver Function Tests:  Recent Labs Lab 11/04/13 0414  AST 29  ALT 23  ALKPHOS 74  BILITOT 0.5  PROT 8.5*  ALBUMIN 2.8*   No results found for this basename: LIPASE, AMYLASE,  in the last 168 hours No results found for this basename: AMMONIA,  in the last 168 hours CBC:  Recent Labs Lab 11/04/13 0414  WBC 14.1*  NEUTROABS 12.9*  HGB 10.0*  HCT 31.2*  MCV 83.2  PLT 294   Cardiac Enzymes: No results found  for this basename: CKTOTAL, CKMB, CKMBINDEX, TROPONINI,  in the last 168 hours BNP (last 3 results)  Recent Labs  03/02/13 2031 09/28/13 1525  PROBNP 12262.0* 8143.0*   CBG:  Recent Labs Lab 11/03/13 2225 11/04/13 0739 11/04/13 1126 11/04/13 1718 11/04/13 2056  GLUCAP 155* 229* 364* 134* 262*    Recent Results (from the past 240 hour(s))  MRSA PCR SCREENING     Status: None   Collection Time    11/03/13  9:50 PM      Result Value Range Status   MRSA by PCR NEGATIVE  NEGATIVE Final   Comment:            The GeneXpert MRSA Assay (FDA     approved for NASAL specimens     only), is one component of a     comprehensive MRSA colonization     surveillance program. It is not     intended to diagnose MRSA     infection nor to guide or     monitor treatment for     MRSA infections.     Studies: No results found.  Scheduled Meds: . ALPRAZolam  0.5 mg Oral QHS  . amLODipine  10 mg Oral Daily  . buPROPion  300 mg Oral q morning - 10a  . ceFEPime (MAXIPIME) IV  1 g Intravenous Q24H  . darbepoetin (ARANESP) injection - DIALYSIS  60 mcg Intravenous Q Wed-HD  . dextromethorphan-guaiFENesin  1 tablet Oral BID  . ferric gluconate (FERRLECIT/NULECIT) IV  62.5 mg Intravenous Q M,W,F-HD  . heparin  5,000 Units Subcutaneous Q8H  . hydrALAZINE  50 mg Oral BID  . insulin aspart  0-15 Units Subcutaneous TID WC  . insulin aspart  0-5 Units Subcutaneous QHS  . insulin glargine  30 Units Subcutaneous QHS  . ipratropium-albuterol  3 mL Nebulization Q4H  . isosorbide mononitrate  30 mg Oral Daily  . metoprolol  50 mg Oral BID  . multivitamin  1 tablet Oral QHS  . nicotine  21 mg Transdermal Daily  . oxyCODONE  20 mg Oral QID  . [START ON 11/05/2013] predniSONE  40 mg Oral Q breakfast  . simvastatin  20 mg Oral QPM   Continuous Infusions:   Time spent: 35 minutes  [name] L  [facility] Pager [phone]. If 7PM-7AM, please contact night-coverage at [email or web address],  password [facility] 11/04/2013, 10:32 PM  LOS: 1 day

## 2013-11-04 NOTE — Consult Note (Signed)
[facility] Renal Consultation Note  Indication for Consultation:  Management of ESRD/hemodialysis; anemia, hypertension/volume and secondary hyperparathyroidism  HPI: Timothy Banks is a 57 y.o. male co increasing coughing with chest pain( pleuritic in nature) associated with his cough and with shortness of breath which has been progressively worsening since last week. Denies missing any Dialysis and last tx left at 142.2 kg below his edw of 143kg.Denies any gi symptoms and using his perm cath with a developing AVF to be used next Tuesday per Dr. Edilia Bo seen in his office recently.  He went to [facility] yesterday with CXR cw Pneumonia.He reports he is using O2 at home and was smoking cigarettes and plans to stop when discharged.   Past Medical History  Diagnosis Date  . Myocardial infarction   . Diabetes mellitus without complication   . CHF (congestive heart failure)   . Asthma   . Hyperlipidemia   . Hypertension   . Chronic kidney disease 02/17/2013    ACUTE RENAL  . Coronary artery disease   . Anginal pain   . Neuromuscular disorder     neuropathy in hands and feet    Past Surgical History  Procedure Laterality Date  . Right carotid endarterectomy  2002    right side  . Coronary artery bypass graft  2002  . Av fistula placement Right 02/25/2013    Procedure: ARTERIOVENOUS (AV) FISTULA CREATION;  Surgeon: Sherren Kerns, MD;  Location: [facility] OR;  Service: Vascular;  Laterality: Right;  Right Radiocephalic AVF  . Insertion of dialysis catheter Right 03/12/2013    Procedure: ultrasound guided INSERTION OF DIALYSIS CATHETER right internal jugular using arrow 23cm;  Surgeon: Chuck Hint, MD;  Location: [facility] OR;  Service: Vascular;  Laterality: Right;  . Ligation of arteriovenous  fistula Right 04/27/2013    Procedure: LIGATION OF RIGHT RADIAL CEPHALIC ARTERIOVENOUS  FISTULA;  Surgeon: Fransisco Hertz, MD;  Location: [facility] OR;  Service: Vascular;  Laterality:  Right;  . Eye surgery      cataract surgery both eyes  . Removal of a dialysis catheter Right 06/10/2013    Procedure: REMOVAL OF RIGHT INTERNAL JUGULAR TUNNELED DIALYSIS CATHETER;  Surgeon: Larina Earthly, MD;  Location: [facility] OR;  Service: Vascular;  Laterality: Right;  . Insertion of dialysis catheter Left 06/10/2013    Procedure: INSERTION OF LEFT INTERNAL JUGULAR TUNNELED DIALYSIS CATHETER;  Surgeon: Larina Earthly, MD;  Location: [facility] OR;  Service: Vascular;  Laterality: Left;  . Removal of a dialysis catheter Left 07/20/2013    Procedure: REMOVAL OF A DIALYSIS CATHETER;  Surgeon: Fransisco Hertz, MD;  Location: [facility] OR;  Service: Vascular;  Laterality: Left;  . Insertion of dialysis catheter Right 07/20/2013    Procedure: INSERTION OF DIALYSIS CATHETER, Right Internal Jugular;  Surgeon: Fransisco Hertz, MD;  Location: [facility] OR;  Service: Vascular;  Laterality: Right;  . Av fistula placement Left 07/20/2013    Procedure: ARTERIOVENOUS (AV) FISTULA CREATION;  Surgeon: Fransisco Hertz, MD;  Location: [facility] OR;  Service: Vascular;  Laterality: Left;      Family History  Problem Relation Age of Onset  . Diabetes Mother   . Heart disease Mother   . Diabetes Father   . Heart disease Father   Social= He lives with wife in [place] area and    reports that he has been smoking Cigarettes.  He started smoking about 40 years ago. He has a 13 pack-year smoking history. He has  never used smokeless tobacco. He reports that he does not drink alcohol or use illicit drugs.  No Known Allergies  Prior to Admission medications   Medication Sig Start Date End Date Taking? Authorizing Provider  albuterol (PROVENTIL HFA;VENTOLIN HFA) 108 (90 BASE) MCG/ACT inhaler Inhale 2 puffs into the lungs every 6 (six) hours as needed for wheezing or shortness of breath.   Yes Historical Provider, MD  ALPRAZolam Prudy Feeler) 1 MG tablet Take 1 mg by mouth at bedtime.   Yes Historical Provider, MD  amLODipine (NORVASC) 10 MG tablet Take 10 mg by mouth  daily.   Yes Historical Provider, MD  buPROPion (WELLBUTRIN XL) 300 MG 24 hr tablet Take 300 mg by mouth every morning.   Yes Historical Provider, MD  furosemide (LASIX) 80 MG tablet Take 2 tablets (160 mg total) by mouth 2 (two) times daily. 03/06/13  Yes [name], MD  glipiZIDE (GLUCOTROL) 10 MG tablet Take 20 mg by mouth 2 (two) times daily before a meal.   Yes Historical Provider, MD  hydrALAZINE (APRESOLINE) 50 MG tablet Take 50-100 mg by mouth 2 (two) times daily. Takes 100mg  in the morning & 50mg  at night   Yes Historical Provider, MD  insulin glargine (LANTUS) 100 UNIT/ML injection Inject 30 Units into the skin at bedtime.   Yes Historical Provider, MD  isosorbide mononitrate (IMDUR) 30 MG 24 hr tablet Take 30 mg by mouth daily.   Yes Historical Provider, MD  nitroGLYCERIN (NITROSTAT) 0.4 MG SL tablet Place 0.4 mg under the tongue every 5 (five) minutes as needed for chest pain. x3 doses as needed for chest pain   Yes Historical Provider, MD  Oxycodone HCl 20 MG TABS Take 1 tablet by mouth 5 (five) times daily.   Yes Historical Provider, MD  Phenylephrine-DM-GG-APAP (MUCINEX FAST-MAX COLD FLU) 5-10-200-325 MG/10ML LIQD Take 30 mLs by mouth daily as needed (for congestion).   Yes Historical Provider, MD  simvastatin (ZOCOR) 20 MG tablet Take 20 mg by mouth every evening.   Yes Eugenia Pancoast    NFA:[id number], nitroGLYCERIN, oxyCODONE  Results for orders placed during the hospital encounter of 11/03/13 (from the past 48 hour(s))  MRSA PCR SCREENING     Status: None   Collection Time    11/03/13  9:50 PM      Result Value Range   MRSA by PCR NEGATIVE  NEGATIVE   Comment:            The GeneXpert MRSA Assay (FDA     approved for NASAL specimens     only), is one component of a     comprehensive MRSA colonization     surveillance program. It is not     intended to diagnose MRSA     infection nor to guide or     monitor treatment for     MRSA infections.  GLUCOSE, CAPILLARY      Status: Abnormal   Collection Time    11/03/13 10:25 PM      Result Value Range   Glucose-Capillary 155 (*) 70 - 99 mg/dL  STREP PNEUMONIAE URINARY ANTIGEN     Status: None   Collection Time    11/04/13  3:53 AM      Result Value Range   Strep Pneumo Urinary Antigen NEGATIVE  NEGATIVE   Comment:            Infection due to S. pneumoniae     cannot be absolutely ruled out  since the antigen present     may be below the detection limit     of the test.  COMPREHENSIVE METABOLIC PANEL     Status: Abnormal   Collection Time    11/04/13  4:14 AM      Result Value Range   Sodium 129 (*) 137 - 147 mEq/L   Comment: Please note change in reference range.   Potassium 4.4  3.7 - 5.3 mEq/L   Comment: Please note change in reference range.   Chloride 90 (*) 96 - 112 mEq/L   CO2 23  19 - 32 mEq/L   Glucose, Bld 169 (*) 70 - 99 mg/dL   BUN 46 (*) 6 - 23 mg/dL   Creatinine, Ser 1.30 (*) 0.50 - 1.35 mg/dL   Calcium 8.1 (*) 8.4 - 10.5 mg/dL   Total Protein 8.5 (*) 6.0 - 8.3 g/dL   Albumin 2.8 (*) 3.5 - 5.2 g/dL   AST 29  0 - 37 U/L   ALT 23  0 - 53 U/L   Alkaline Phosphatase 74  39 - 117 U/L   Total Bilirubin 0.5  0.3 - 1.2 mg/dL   GFR calc non Af Amer 18 (*) >90 mL/min   GFR calc Af Amer 21 (*) >90 mL/min   Comment: (NOTE)     The eGFR has been calculated using the CKD EPI equation.     This calculation has not been validated in all clinical situations.     eGFR's persistently <90 mL/min signify possible Chronic Kidney     Disease.  CBC WITH DIFFERENTIAL     Status: Abnormal   Collection Time    11/04/13  4:14 AM      Result Value Range   WBC 14.1 (*) 4.0 - 10.5 K/uL   RBC 3.75 (*) 4.22 - 5.81 MIL/uL   Hemoglobin 10.0 (*) 13.0 - 17.0 g/dL   HCT 86.5 (*) 78.4 - 69.6 %   MCV 83.2  78.0 - 100.0 fL   MCH 26.7  26.0 - 34.0 pg   MCHC 32.1  30.0 - 36.0 g/dL   RDW 29.5 (*) 28.4 - 13.2 %   Platelets 294  150 - 400 K/uL   Neutrophils Relative % 92 (*) 43 - 77 %   Neutro Abs 12.9 (*)  1.7 - 7.7 K/uL   Lymphocytes Relative 4 (*) 12 - 46 %   Lymphs Abs 0.5 (*) 0.7 - 4.0 K/uL   Monocytes Relative 3  3 - 12 %   Monocytes Absolute 0.4  0.1 - 1.0 K/uL   Eosinophils Relative 2  0 - 5 %   Eosinophils Absolute 0.3  0.0 - 0.7 K/uL   Basophils Relative 0  0 - 1 %   Basophils Absolute 0.0  0.0 - 0.1 K/uL  GLUCOSE, CAPILLARY     Status: Abnormal   Collection Time    11/04/13  7:39 AM      Result Value Range   Glucose-Capillary 229 (*) 70 - 99 mg/dL    ROS: see hpi for positives  Physical Exam: Filed Vitals:   11/04/13 0847  BP: 158/67  Pulse:   Temp:   Resp: 17     General: Alert Obese WM NAD, Appropriate HEENT: [place] MMM Eyes: [place] Neck: No jvd Heart: RRR. Soft 1/6 sem apex, no rub, no gallop Lungs: Scattered faint wheezes , RLL crackles Abdomen:  Obese, bs pos., soft nontender, nondistended Extremities: Trace bipedal edema  Skin: Healed R lower leg  surg scar from vein harvesting. No overt rash or ulcer noted Neuro: OX3 movesall extrem. Dialysis Access: POS. Bruit L FA AVF. R ij Perm cath no dc , nontender  Dialysis Orders: Center: [facility]   on TTS . EDW 143( needing to lower , last hd left at 142.2 tolerated) HD Bath 3.0 K , ca 2.25  Time 5.0 hrs Heparin 4000. Access L FA AVF/( for use 1st week of JAN 2015)  R ij Perm cath BFR 400 DFR 800   Hectorol 0 mcg IV/HD Epogen 2800   Units IV/HD  Venofer  100mg  weekly   Other op labs 12/11 yf sat =18%, Pth 274, HGB 10.1 (10/28/13 )< 9.8  Assessment/Plan 1. PNA - admit team tx on IV VANCO /Cefepime and solumedrol with copd hx 2. ESRD - HD today normal TTS  ([facility] (Mon, wed , sat holiday schedule) 3. Hypertension/volume  - lowering edw at op unit with decr po intake./ htn meds taper as needed/ noted still on Lasix po will hold in hosp use hd for diuresis with esrd 4. Anemia  - ESA and venofer weekly/ hgb =10.0 5. Metabolic bone disease -   No vit d , binders with meals 6. Nutrition - Renal / carb mod diet . Renal vitamin   7. DM type 2 - per admit 8. COPD/ tobacco abuse - per pt plans to stop smoking now 9. CAD/ prior CABG-  Lenny Pastel, PA-C [facility] Beeper [phone] 11/04/2013, 8:57 AM   I have seen and examined patient, discussed with PA and agree with assessment and plan as outlined above.   Vinson Moselle MD pager [phone]    cell [phone] 11/04/2013, 12:32 PM

## 2013-11-04 NOTE — Progress Notes (Addendum)
ANTIBIOTIC CONSULT NOTE - Follow-Up  Pharmacy Consult for Vancomycin Indication: HCAP  No Known Allergies  Patient Measurements: Height: 5\' 7"  (170.2 cm) Weight: 310 lb 13.6 oz (141 kg) IBW/kg (Calculated) : 66.1   Vital Signs: Temp: 97.4 F (36.3 C) (12/31 1123) Temp src: Oral (12/31 1123) BP: 146/95 mmHg (12/31 1123) Pulse Rate: 63 (12/31 1123) Intake/Output from previous day: 12/30 0701 - 12/31 0700 In: 1010 [P.O.:460; IV Piggyback:550] Out: 275 [Urine:275] Intake/Output from this shift:    Labs:  Recent Labs  11/04/13 0414  WBC 14.1*  HGB 10.0*  PLT 294  CREATININE 3.42*   Estimated Creatinine Clearance: 32.4 ml/min (by C-G formula based on Cr of 3.42). No results found for this basename: VANCOTROUGH, Leodis Binet, VANCORANDOM, GENTTROUGH, GENTPEAK, GENTRANDOM, TOBRATROUGH, TOBRAPEAK, TOBRARND, AMIKACINPEAK, AMIKACINTROU, AMIKACIN,  in the last 72 hours   Microbiology: Recent Results (from the past 720 hour(s))  MRSA PCR SCREENING     Status: None   Collection Time    11/03/13  9:50 PM      Result Value Range Status   MRSA by PCR NEGATIVE  NEGATIVE Final   Comment:            The GeneXpert MRSA Assay (FDA     approved for NASAL specimens     only), is one component of a     comprehensive MRSA colonization     surveillance program. It is not     intended to diagnose MRSA     infection nor to guide or     monitor treatment for     MRSA infections.    Medical History: Past Medical History  Diagnosis Date  . Myocardial infarction   . Diabetes mellitus without complication   . CHF (congestive heart failure)   . Asthma   . Hyperlipidemia   . Hypertension   . Chronic kidney disease 02/17/2013    ACUTE RENAL  . Coronary artery disease   . Anginal pain   . Neuromuscular disorder     neuropathy in hands and feet    Medications:  Prescriptions prior to admission  Medication Sig Dispense Refill  . albuterol (PROVENTIL HFA;VENTOLIN HFA) 108 (90 BASE)  MCG/ACT inhaler Inhale 2 puffs into the lungs every 6 (six) hours as needed for wheezing or shortness of breath.      . ALPRAZolam (XANAX) 1 MG tablet Take 1 mg by mouth at bedtime.      Marland Kitchen amLODipine (NORVASC) 10 MG tablet Take 10 mg by mouth daily.      Marland Kitchen buPROPion (WELLBUTRIN XL) 300 MG 24 hr tablet Take 300 mg by mouth every morning.      . furosemide (LASIX) 80 MG tablet Take 2 tablets (160 mg total) by mouth 2 (two) times daily.  60 tablet  0  . glipiZIDE (GLUCOTROL) 10 MG tablet Take 20 mg by mouth 2 (two) times daily before a meal.      . hydrALAZINE (APRESOLINE) 50 MG tablet Take 50-100 mg by mouth 2 (two) times daily. Takes 100mg  in the morning & 50mg  at night      . insulin glargine (LANTUS) 100 UNIT/ML injection Inject 30 Units into the skin at bedtime.      . isosorbide mononitrate (IMDUR) 30 MG 24 hr tablet Take 30 mg by mouth daily.      . nitroGLYCERIN (NITROSTAT) 0.4 MG SL tablet Place 0.4 mg under the tongue every 5 (five) minutes as needed for chest pain. x3 doses as needed for  chest pain      . Oxycodone HCl 20 MG TABS Take 1 tablet by mouth 5 (five) times daily.      Marland Kitchen Phenylephrine-DM-GG-APAP (MUCINEX FAST-MAX COLD FLU) 5-10-200-325 MG/10ML LIQD Take 30 mLs by mouth daily as needed (for congestion).      . simvastatin (ZOCOR) 20 MG tablet Take 20 mg by mouth every evening.       Assessment: 57 yo M admitted on 11/03/2013 for cough and SOB to start broad spectrum antibiotics for suspected HCAP. Pharmacy consulted to dose vancomycin and renally adjust other antibiotics. Patient is ESRD and usual HD schedule is TTS, but he is getting a session today. WBC 14.1, afebrile, Scr 3.42.   Cefepime 12/30 >> Vanc 12/30 >>  12/30 BCx x 2 >> sent  Goal of Therapy:  Target pre-HD level 15-42mcg/mL Target post-HD level 5-27mcg/mL  Plan:  - Schedule vancomycin 1000mg  IV x 1 today post-HD, then follow-up HD schedule to redose - Change cefepime to 1g IV q24hrs until back on regular HD  schedule, then 2g IV qHD - Follow up SCr, UOP, cultures, clinical course and adjust as clinically indicated  Loucinda Croy A. Lenon Ahmadi, PharmD Clinical Pharmacist - Resident Pager: [phone] Pharmacy: [phone] 11/04/2013 11:42 AM

## 2013-11-05 ENCOUNTER — Encounter (HOSPITAL_COMMUNITY): Payer: Self-pay

## 2013-11-05 ENCOUNTER — Emergency Department (HOSPITAL_COMMUNITY): Payer: Medicare Other

## 2013-11-05 ENCOUNTER — Encounter (HOSPITAL_COMMUNITY): Payer: Self-pay | Admitting: Emergency Medicine

## 2013-11-05 ENCOUNTER — Observation Stay (HOSPITAL_COMMUNITY)
Admission: EM | Admit: 2013-11-05 | Discharge: 2013-11-06 | Disposition: A | Discharge status: Home / Self Care | Payer: Medicare Other | Attending: Internal Medicine | Admitting: Internal Medicine

## 2013-11-05 DIAGNOSIS — M545 Low back pain, unspecified: Secondary | ICD-10-CM

## 2013-11-05 DIAGNOSIS — J9610 Chronic respiratory failure, unspecified whether with hypoxia or hypercapnia: Secondary | ICD-10-CM

## 2013-11-05 DIAGNOSIS — I252 Old myocardial infarction: Secondary | ICD-10-CM | POA: Insufficient documentation

## 2013-11-05 DIAGNOSIS — E119 Type 2 diabetes mellitus without complications: Secondary | ICD-10-CM

## 2013-11-05 DIAGNOSIS — M79605 Pain in left leg: Secondary | ICD-10-CM

## 2013-11-05 DIAGNOSIS — I2510 Atherosclerotic heart disease of native coronary artery without angina pectoris: Secondary | ICD-10-CM

## 2013-11-05 DIAGNOSIS — Z992 Dependence on renal dialysis: Secondary | ICD-10-CM | POA: Insufficient documentation

## 2013-11-05 DIAGNOSIS — N184 Chronic kidney disease, stage 4 (severe): Secondary | ICD-10-CM

## 2013-11-05 DIAGNOSIS — J441 Chronic obstructive pulmonary disease with (acute) exacerbation: Secondary | ICD-10-CM

## 2013-11-05 DIAGNOSIS — N186 End stage renal disease: Secondary | ICD-10-CM | POA: Diagnosis present

## 2013-11-05 DIAGNOSIS — Z9119 Patient's noncompliance with other medical treatment and regimen: Secondary | ICD-10-CM | POA: Insufficient documentation

## 2013-11-05 DIAGNOSIS — M79604 Pain in right leg: Secondary | ICD-10-CM

## 2013-11-05 DIAGNOSIS — F17200 Nicotine dependence, unspecified, uncomplicated: Secondary | ICD-10-CM | POA: Insufficient documentation

## 2013-11-05 DIAGNOSIS — Z6841 Body Mass Index (BMI) 40.0 and over, adult: Secondary | ICD-10-CM | POA: Insufficient documentation

## 2013-11-05 DIAGNOSIS — J9601 Acute respiratory failure with hypoxia: Secondary | ICD-10-CM

## 2013-11-05 DIAGNOSIS — M79672 Pain in left foot: Secondary | ICD-10-CM

## 2013-11-05 DIAGNOSIS — Z9889 Other specified postprocedural states: Secondary | ICD-10-CM

## 2013-11-05 DIAGNOSIS — J180 Bronchopneumonia, unspecified organism: Secondary | ICD-10-CM | POA: Insufficient documentation

## 2013-11-05 DIAGNOSIS — E785 Hyperlipidemia, unspecified: Secondary | ICD-10-CM | POA: Insufficient documentation

## 2013-11-05 DIAGNOSIS — E8770 Fluid overload, unspecified: Secondary | ICD-10-CM

## 2013-11-05 DIAGNOSIS — G5690 Unspecified mononeuropathy of unspecified upper limb: Secondary | ICD-10-CM | POA: Insufficient documentation

## 2013-11-05 DIAGNOSIS — R0789 Other chest pain: Principal | ICD-10-CM | POA: Insufficient documentation

## 2013-11-05 DIAGNOSIS — D72829 Elevated white blood cell count, unspecified: Secondary | ICD-10-CM

## 2013-11-05 DIAGNOSIS — Z8249 Family history of ischemic heart disease and other diseases of the circulatory system: Secondary | ICD-10-CM | POA: Insufficient documentation

## 2013-11-05 DIAGNOSIS — N179 Acute kidney failure, unspecified: Secondary | ICD-10-CM

## 2013-11-05 DIAGNOSIS — E876 Hypokalemia: Secondary | ICD-10-CM

## 2013-11-05 DIAGNOSIS — E118 Type 2 diabetes mellitus with unspecified complications: Secondary | ICD-10-CM

## 2013-11-05 DIAGNOSIS — I120 Hypertensive chronic kidney disease with stage 5 chronic kidney disease or end stage renal disease: Secondary | ICD-10-CM | POA: Insufficient documentation

## 2013-11-05 DIAGNOSIS — IMO0002 Reserved for concepts with insufficient information to code with codable children: Secondary | ICD-10-CM | POA: Diagnosis present

## 2013-11-05 DIAGNOSIS — R200 Anesthesia of skin: Secondary | ICD-10-CM

## 2013-11-05 DIAGNOSIS — G458 Other transient cerebral ischemic attacks and related syndromes: Secondary | ICD-10-CM

## 2013-11-05 DIAGNOSIS — Z9111 Patient's noncompliance with dietary regimen: Secondary | ICD-10-CM

## 2013-11-05 DIAGNOSIS — I509 Heart failure, unspecified: Secondary | ICD-10-CM | POA: Insufficient documentation

## 2013-11-05 DIAGNOSIS — Z720 Tobacco use: Secondary | ICD-10-CM

## 2013-11-05 DIAGNOSIS — R079 Chest pain, unspecified: Secondary | ICD-10-CM

## 2013-11-05 DIAGNOSIS — G5790 Unspecified mononeuropathy of unspecified lower limb: Secondary | ICD-10-CM | POA: Insufficient documentation

## 2013-11-05 DIAGNOSIS — Z951 Presence of aortocoronary bypass graft: Secondary | ICD-10-CM

## 2013-11-05 DIAGNOSIS — M79671 Pain in right foot: Secondary | ICD-10-CM

## 2013-11-05 DIAGNOSIS — J811 Chronic pulmonary edema: Secondary | ICD-10-CM

## 2013-11-05 DIAGNOSIS — J189 Pneumonia, unspecified organism: Secondary | ICD-10-CM | POA: Diagnosis present

## 2013-11-05 DIAGNOSIS — Z91199 Patient's noncompliance with other medical treatment and regimen due to unspecified reason: Secondary | ICD-10-CM | POA: Insufficient documentation

## 2013-11-05 DIAGNOSIS — E1165 Type 2 diabetes mellitus with hyperglycemia: Secondary | ICD-10-CM

## 2013-11-05 DIAGNOSIS — J9600 Acute respiratory failure, unspecified whether with hypoxia or hypercapnia: Secondary | ICD-10-CM

## 2013-11-05 HISTORY — DX: Presence of aortocoronary bypass graft: Z95.1

## 2013-11-05 HISTORY — DX: Other specified postprocedural states: Z98.890

## 2013-11-05 LAB — BASIC METABOLIC PANEL
BUN: 41 mg/dL — ABNORMAL HIGH (ref 6–23)
CHLORIDE: 90 meq/L — AB (ref 96–112)
CO2: 26 mEq/L (ref 19–32)
Calcium: 8.2 mg/dL — ABNORMAL LOW (ref 8.4–10.5)
Creatinine, Ser: 2.89 mg/dL — ABNORMAL HIGH (ref 0.50–1.35)
GFR calc Af Amer: 26 mL/min — ABNORMAL LOW (ref >90)
GFR calc non Af Amer: 23 mL/min — ABNORMAL LOW (ref >90)
Glucose, Bld: 199 mg/dL — ABNORMAL HIGH (ref 70–99)
Potassium: 4.3 mEq/L (ref 3.7–5.3)
Sodium: 131 mEq/L — ABNORMAL LOW (ref 137–147)

## 2013-11-05 LAB — CBC
HCT: 30.7 % — ABNORMAL LOW (ref 39.0–52.0)
HEMOGLOBIN: 9.7 g/dL — AB (ref 13.0–17.0)
MCH: 25.9 pg — ABNORMAL LOW (ref 26.0–34.0)
MCHC: 31.6 g/dL (ref 30.0–36.0)
MCV: 81.9 fL (ref 78.0–100.0)
PLATELETS: 357 10*3/uL (ref 150–400)
RBC: 3.75 MIL/uL — AB (ref 4.22–5.81)
RDW: 15.4 % (ref 11.5–15.5)
WBC: 15.8 10*3/uL — ABNORMAL HIGH (ref 4.0–10.5)

## 2013-11-05 LAB — GLUCOSE, CAPILLARY
GLUCOSE-CAPILLARY: 272 mg/dL — AB (ref 70–99)
Glucose-Capillary: 279 mg/dL — ABNORMAL HIGH (ref 70–99)

## 2013-11-05 LAB — PRO B NATRIURETIC PEPTIDE: PRO B NATRI PEPTIDE: 5830 pg/mL — AB (ref 0–125)

## 2013-11-05 LAB — POCT I-STAT TROPONIN I: TROPONIN I, POC: 0.03 ng/mL (ref 0.00–0.08)

## 2013-11-05 MED ORDER — MOXIFLOXACIN HCL 400 MG PO TABS: 400.0000 mg | ORAL_TABLET | Freq: Every day | ORAL | Status: AC

## 2013-11-05 MED ORDER — DEXTROSE 5 % IV SOLN: 2.0000 g | INTRAVENOUS | Status: DC

## 2013-11-05 MED ORDER — VANCOMYCIN HCL IN DEXTROSE 1-5 GM/200ML-% IV SOLN: 1000.0000 mg | INTRAVENOUS | Status: DC

## 2013-11-05 MED ORDER — IPRATROPIUM-ALBUTEROL 0.5-2.5 (3) MG/3ML IN SOLN: 3.0000 mL | RESPIRATORY_TRACT | Status: AC | PRN

## 2013-11-05 MED ORDER — PREDNISONE 10 MG PO TABS: ORAL_TABLET | ORAL | Status: AC

## 2013-11-05 NOTE — Progress Notes (Signed)
While pt was waiting on ride to arrive for discharge pt developed onset of chest pain at 8/10; Pt took two of his own home nitroglycerin's w/o much relief; Pt then informed RN that he was having chest pain; MD Lendell CapriceSullivan notified; EKG Obtained; Telemetry reattached; VSS; 3 L [place]; PIV reinserted; Pt's pain at 1237 was a 2 out of 10; Pt's pain medication given; CBG checked; Pt reeducated on importance of reporting symptoms immediately and to not take home medications while in the hospital; will continue to monitor.

## 2013-11-05 NOTE — Progress Notes (Signed)
Dr Lendell CapriceSullivan notified RN that pt EKG was normal; RN informed Dr Lendell CapriceSullivan that pt was pain free; Dr. Lendell CapriceSullivan said pt was ok to be discharged under same orders as before. PIV removed; Pt taken to discharge in wheelchair by NT; all belongings with pt.

## 2013-11-05 NOTE — ED Notes (Signed)
MD at bedside. 

## 2013-11-05 NOTE — ED Notes (Signed)
MD at Bedside.

## 2013-11-05 NOTE — Discharge Summary (Signed)
Physician Discharge Summary  Timothy Banks UJW:[id number] DOB: [birth year] DOA: 11/03/2013  PCP: Anselmo Pickler, MD  Admit date: 11/03/2013 Discharge date: 11/05/2013  Time spent: *greater than 30 min   Discharge Diagnoses:  Principal Problem:   HCAP (healthcare-associated pneumonia) Active Problems:   Bilateral leg and foot pain   Type II or unspecified type diabetes mellitus with unspecified complication, uncontrolled   Morbid obesity   ESRD on dialysis   Coronary artery disease   Tobacco abuse   COPD exacerbation  Discharge Condition: stable  Filed Weights   11/04/13 1148 11/04/13 1655 11/05/13 0600  Weight: 144.2 kg (317 lb 14.5 oz) 141.9 kg (312 lb 13.3 oz) 142.4 kg (313 lb 15 oz)    History of present illness:  58 y.o. male with Past medical history of coronary artery disease, CHF, diabetes mellitus, active smoker.  The patient is coming from home.  The patient is presenting with a complaint of cough with shortness of breath which has been progressively worsening since last one week. He denies any sick contact or travel or recent immobilization or surgery. In mentions he started having cough with runny nose he was bringing up light yellow phlegm without any blood and then the shortness of breath started progressively worsened to the extent that he was short of breath even at rest or for which he came to the hospital. He also complains of some pleuritic component of chest pain located in the lower rib region bilaterally. He denies any nausea or vomiting abdominal pain diarrhea or constipation orthopnea or PND. He does have some leg swellings which he mentions is about his baseline. He denies any signs of aspiration  Hospital Course:  Admitted to hospitalists, nephrology consulting. Received dialysis per schedule.  Started on broad spectrum antibiotics for HCAP. Steroids for COPD exacerbation, as did have significant wheezing on exam.  Improved quickly and requesting  discharge for 2 days now.  On day of discharge, breath sounds improved. Vitals stable since admission.  Stable for discharge on oral antibiotics and steroid taper.  counselled to quit smoking  Procedures:  hemodialysis  Consultations:  nephrology  Discharge Exam: Filed Vitals:   11/05/13 0803  BP: 141/58  Pulse: 67  Temp: 97.9 F (36.6 C)  Resp: 24    General: comfortable Cardiovascular: RRR Respiratory: CTA Ext no cce  Discharge Instructions  Discharge Orders   Future Orders Complete By Expires   Discharge instructions  As directed    Comments:     RENAL DIABETIC DIET.   Increase activity slowly  As directed        Medication List         albuterol 108 (90 BASE) MCG/ACT inhaler  Commonly known as:  PROVENTIL HFA;VENTOLIN HFA  Inhale 2 puffs into the lungs every 6 (six) hours as needed for wheezing or shortness of breath.     ALPRAZolam 1 MG tablet  Commonly known as:  XANAX  Take 1 mg by mouth at bedtime.     amLODipine 10 MG tablet  Commonly known as:  NORVASC  Take 10 mg by mouth daily.     buPROPion 300 MG 24 hr tablet  Commonly known as:  WELLBUTRIN XL  Take 300 mg by mouth every morning.     furosemide 80 MG tablet  Commonly known as:  LASIX  Take 2 tablets (160 mg total) by mouth 2 (two) times daily.     glipiZIDE 10 MG tablet  Commonly known as:  GLUCOTROL  Take 20 mg by mouth 2 (two) times daily before a meal.     hydrALAZINE 50 MG tablet  Commonly known as:  APRESOLINE  Take 50-100 mg by mouth 2 (two) times daily. Takes 100mg  in the morning & 50mg  at night     insulin glargine 100 UNIT/ML injection  Commonly known as:  LANTUS  Inject 30 Units into the skin at bedtime.     ipratropium-albuterol 0.5-2.5 (3) MG/3ML Soln  Commonly known as:  DUONEB  Take 3 mLs by nebulization every 4 (four) hours as needed (wheezing).     isosorbide mononitrate 30 MG 24 hr tablet  Commonly known as:  IMDUR  Take 30 mg by mouth daily.      moxifloxacin 400 MG tablet  Commonly known as:  AVELOX  Take 1 tablet (400 mg total) by mouth daily.     MUCINEX FAST-MAX COLD FLU 5-10-200-325 MG/10ML Liqd  Generic drug:  Phenylephrine-DM-GG-APAP  Take 30 mLs by mouth daily as needed (for congestion).     nitroGLYCERIN 0.4 MG SL tablet  Commonly known as:  NITROSTAT  Place 0.4 mg under the tongue every 5 (five) minutes as needed for chest pain. x3 doses as needed for chest pain     Oxycodone HCl 20 MG Tabs  Take 1 tablet by mouth 5 (five) times daily.     predniSONE 10 MG tablet  Commonly known as:  DELTASONE  3 tablets daily for 2 days, then decrease by 1 tablet every 2 days until off     simvastatin 20 MG tablet  Commonly known as:  ZOCOR  Take 20 mg by mouth every evening.       No Known Allergies     Follow-up Information   Follow up with ACHREJA, Youlanda Mighty, MD In 2 weeks.   Specialty:  Family Medicine   Contact information:   [address] [place] [address] [phone]        The results of significant diagnostics from this hospitalization (including imaging, microbiology, ancillary and laboratory) are listed below for reference.    Significant Diagnostic Studies: No results found.  Microbiology: Recent Results (from the past 240 hour(s))  MRSA PCR SCREENING     Status: None   Collection Time    11/03/13  9:50 PM      Result Value Range Status   MRSA by PCR NEGATIVE  NEGATIVE Final   Comment:            The GeneXpert MRSA Assay (FDA     approved for NASAL specimens     only), is one component of a     comprehensive MRSA colonization     surveillance program. It is not     intended to diagnose MRSA     infection nor to guide or     monitor treatment for     MRSA infections.     Labs: Basic Metabolic Panel:  Recent Labs Lab 11/04/13 0414  NA 129*  K 4.4  CL 90*  CO2 23  GLUCOSE 169*  BUN 46*  CREATININE 3.42*  CALCIUM 8.1*   Liver Function Tests:  Recent Labs Lab  11/04/13 0414  AST 29  ALT 23  ALKPHOS 74  BILITOT 0.5  PROT 8.5*  ALBUMIN 2.8*   No results found for this basename: LIPASE, AMYLASE,  in the last 168 hours No results found for this basename: AMMONIA,  in the last 168 hours CBC:  Recent Labs Lab 11/04/13 0414  WBC 14.1*  NEUTROABS 12.9*  HGB 10.0*  HCT 31.2*  MCV 83.2  PLT 294   Cardiac Enzymes: No results found for this basename: CKTOTAL, CKMB, CKMBINDEX, TROPONINI,  in the last 168 hours BNP: BNP (last 3 results)  Recent Labs  03/02/13 2031 09/28/13 1525  PROBNP 12262.0* 8143.0*   CBG:  Recent Labs Lab 11/04/13 0739 11/04/13 1126 11/04/13 1718 11/04/13 2056 11/05/13 0801  GLUCAP 229* 364* 134* 262* 272*       Signed:  Elwanda Moger L  [facility] 11/05/2013, 10:49 AM

## 2013-11-05 NOTE — ED Notes (Addendum)
[facility]. C/o mid-sternal cp, non radiating, no sob, no n/v. EMS gave x 1 ngt. Sl. 0.4 mg and pt. Took x 1 sl. Ntg. 0.4 mg at home, and pt. Took 324 mg asa.  Cp 3/10 after 2nd ntg. bp did drop after 2 nd ntg.

## 2013-11-05 NOTE — Progress Notes (Signed)
Pt discharge instructions given to pt & wife; All questions & concerns answered; Pt PIV removed; pt discharged to home.

## 2013-11-05 NOTE — ED Notes (Signed)
Admitting MD at bedside.

## 2013-11-05 NOTE — ED Provider Notes (Signed)
CSN: [id number]     Arrival date & time 11/05/13  1923 History   First MD Initiated Contact with Patient 11/05/13 1931     Chief Complaint  Patient presents with  . Chest Pain   HPI  58 y/o male with history of CAD s/p CABG (02'), HTN, HLD, ESRD who presents with cc of chest pain. Was discharged from the hospital earlier today for pneumonia. Was discharged home with script for avelox. While leaving he developed chest pain. The patient was evaluated prior to discharge and had a normal EKG and was discharged. He states he took 4 oxycodone and his pain went away. When he returned home he had persistent pain. He describes the pain as substernal, non-radiating, pressure like. He has associated cough. He denies any SOB, diaphoresis or nausea. He denies any modifying factors. His pain is currently a 6/10.   Past Medical History  Diagnosis Date  . Myocardial infarction   . Diabetes mellitus without complication   . CHF (congestive heart failure)   . Asthma   . Hyperlipidemia   . Hypertension   . Chronic kidney disease 02/17/2013    ACUTE RENAL  . Coronary artery disease   . Anginal pain   . Neuromuscular disorder     neuropathy in hands and feet   Past Surgical History  Procedure Laterality Date  . Right carotid endarterectomy  2002    right side  . Coronary artery bypass graft  2002  . Av fistula placement Right 02/25/2013    Procedure: ARTERIOVENOUS (AV) FISTULA CREATION;  Surgeon: Sherren Kerns, MD;  Location: [facility] OR;  Service: Vascular;  Laterality: Right;  Right Radiocephalic AVF  . Insertion of dialysis catheter Right 03/12/2013    Procedure: ultrasound guided INSERTION OF DIALYSIS CATHETER right internal jugular using arrow 23cm;  Surgeon: Chuck Hint, MD;  Location: [facility] OR;  Service: Vascular;  Laterality: Right;  . Ligation of arteriovenous  fistula Right 04/27/2013    Procedure: LIGATION OF RIGHT RADIAL CEPHALIC ARTERIOVENOUS  FISTULA;  Surgeon: Fransisco Hertz, MD;  Location:  [facility] OR;  Service: Vascular;  Laterality: Right;  . Eye surgery      cataract surgery both eyes  . Removal of a dialysis catheter Right 06/10/2013    Procedure: REMOVAL OF RIGHT INTERNAL JUGULAR TUNNELED DIALYSIS CATHETER;  Surgeon: Larina Earthly, MD;  Location: [facility] OR;  Service: Vascular;  Laterality: Right;  . Insertion of dialysis catheter Left 06/10/2013    Procedure: INSERTION OF LEFT INTERNAL JUGULAR TUNNELED DIALYSIS CATHETER;  Surgeon: Larina Earthly, MD;  Location: [facility] OR;  Service: Vascular;  Laterality: Left;  . Removal of a dialysis catheter Left 07/20/2013    Procedure: REMOVAL OF A DIALYSIS CATHETER;  Surgeon: Fransisco Hertz, MD;  Location: [facility] OR;  Service: Vascular;  Laterality: Left;  . Insertion of dialysis catheter Right 07/20/2013    Procedure: INSERTION OF DIALYSIS CATHETER, Right Internal Jugular;  Surgeon: Fransisco Hertz, MD;  Location: [facility] OR;  Service: Vascular;  Laterality: Right;  . Av fistula placement Left 07/20/2013    Procedure: ARTERIOVENOUS (AV) FISTULA CREATION;  Surgeon: Fransisco Hertz, MD;  Location: [facility] OR;  Service: Vascular;  Laterality: Left;   Family History  Problem Relation Age of Onset  . Diabetes Mother   . Heart disease Mother   . Diabetes Father   . Heart disease Father    History  Substance Use Topics  . Smoking status: Current Every Day Smoker -- 0.50  packs/day for 26 years    Types: Cigarettes    Start date: 11/05/1973    Last Attempt to Quit: 06/09/2003  . Smokeless tobacco: Never Used     Comment: quit for 10 years, started back 9 mo ago  . Alcohol Use: No    Review of Systems  Constitutional: Negative for fever and chills.  Respiratory: Positive for cough. Negative for shortness of breath.   Cardiovascular: Positive for chest pain.  Gastrointestinal: Negative for nausea, vomiting and abdominal pain.  Genitourinary: Negative for dysuria and frequency.  All other systems reviewed and are negative.    Allergies  Review of patient's allergies  indicates no known allergies.  Home Medications   Current Outpatient Rx  Name  Route  Sig  Dispense  Refill  . albuterol (PROVENTIL HFA;VENTOLIN HFA) 108 (90 BASE) MCG/ACT inhaler   Inhalation   Inhale 2 puffs into the lungs every 6 (six) hours as needed for wheezing or shortness of breath.         Marland Kitchen amLODipine (NORVASC) 10 MG tablet   Oral   Take 10 mg by mouth daily.         Marland Kitchen buPROPion (WELLBUTRIN XL) 300 MG 24 hr tablet   Oral   Take 300 mg by mouth every morning.         . furosemide (LASIX) 80 MG tablet   Oral   Take 160 mg by mouth 2 (two) times daily.         Marland Kitchen glipiZIDE (GLUCOTROL) 10 MG tablet   Oral   Take 20 mg by mouth 2 (two) times daily before a meal.         . hydrALAZINE (APRESOLINE) 50 MG tablet   Oral   Take 50-100 mg by mouth 2 (two) times daily. Takes 100mg  in the morning & 50mg  at night         . insulin glargine (LANTUS) 100 UNIT/ML injection   Subcutaneous   Inject 30 Units into the skin at bedtime.         Marland Kitchen ipratropium-albuterol (DUONEB) 0.5-2.5 (3) MG/3ML SOLN   Nebulization   Take 3 mLs by nebulization every 4 (four) hours as needed (wheezing).   360 mL   0   . isosorbide mononitrate (IMDUR) 30 MG 24 hr tablet   Oral   Take 30 mg by mouth daily.         Marland Kitchen moxifloxacin (AVELOX) 400 MG tablet   Oral   Take 1 tablet (400 mg total) by mouth daily.   5 tablet   0   . nitroGLYCERIN (NITROSTAT) 0.4 MG SL tablet   Sublingual   Place 0.4 mg under the tongue every 5 (five) minutes as needed for chest pain. x3 doses as needed for chest pain         . Oxycodone HCl 20 MG TABS   Oral   Take 20 mg by mouth 5 (five) times daily.          . predniSONE (DELTASONE) 10 MG tablet      3 tablets daily for 2 days, then decrease by 1 tablet every 2 days until off   12 tablet   0   . simvastatin (ZOCOR) 20 MG tablet   Oral   Take 20 mg by mouth every evening.         Marland Kitchen ALPRAZolam (XANAX) 1 MG tablet   Oral   Take 2 mg by  mouth 3 (three) times a week. *takes  on Tuesday, Thursday, and Saturday before he goes to dialysis*          BP 142/59  Pulse 62  Temp(Src) 97.7 F (36.5 C) (Oral)  Resp 12  Ht 5\' 7"  (1.702 m)  Wt 315 lb 4.1 oz (143 kg)  BMI 49.36 kg/m2  SpO2 98% Physical Exam  Constitutional: He is oriented to person, place, and time. He appears well-developed. No distress.  obese  HENT:  Head: Normocephalic and atraumatic.  Mouth/Throat: No oropharyngeal exudate.  Eyes: Conjunctivae are normal. Pupils are equal, round, and reactive to light.  Neck: Normal range of motion. Neck supple.  Cardiovascular: Normal rate and normal heart sounds.  Exam reveals no gallop and no friction rub.   No murmur heard. Pulmonary/Chest: Effort normal and breath sounds normal.  Right chest catheter. No redness or tenderness.  Abdominal: Soft. He exhibits no distension. There is no tenderness.  Musculoskeletal: Normal range of motion. He exhibits edema (+1 pitting edema in bilateral lower extremities symmetric). He exhibits no tenderness.  Neurological: He is alert and oriented to person, place, and time. He has normal strength and normal reflexes. No cranial nerve deficit or sensory deficit. Coordination normal. GCS eye subscore is 4. GCS verbal subscore is 5. GCS motor subscore is 6.  Skin: Skin is warm and dry.    ED Course  Procedures (including critical care time) Labs Review Labs Reviewed  CBC - Abnormal; Notable for the following:    WBC 15.8 (*)    RBC 3.75 (*)    Hemoglobin 9.7 (*)    HCT 30.7 (*)    MCH 25.9 (*)    All other components within normal limits  BASIC METABOLIC PANEL - Abnormal; Notable for the following:    Sodium 131 (*)    Chloride 90 (*)    Glucose, Bld 199 (*)    BUN 41 (*)    Creatinine, Ser 2.89 (*)    Calcium 8.2 (*)    GFR calc non Af Amer 23 (*)    GFR calc Af Amer 26 (*)    All other components within normal limits  PRO B NATRIURETIC PEPTIDE - Abnormal; Notable for  the following:    Pro B Natriuretic peptide (BNP) 5830.0 (*)    All other components within normal limits  TROPONIN I  TROPONIN I  TROPONIN I  POCT I-STAT TROPONIN I   Imaging Review Dg Chest 2 View  11/05/2013   CLINICAL DATA:  Chest pain and pressure. Shortness of breath. Recently discharged from the hospital with pneumonia.  EXAM: CHEST  2 VIEW  COMPARISON:  Chest x-ray 11/03/2013.  FINDINGS: Lung volumes are normal. Resolving airspace consolidation in the right lower lobe, compatible with resolving bronchopneumonia. No new acute consolidative airspace disease. No pleural effusions. Cephalization of the pulmonary vasculature, without frank pulmonary edema. Mild cardiomegaly is unchanged. Upper mediastinal contours are within normal limits. Atherosclerosis in the thoracic aorta. Status post median sternotomy for CABG. Right internal jugular PermCath with tip terminating at the superior cavoatrial junction.  IMPRESSION: 1. Resolving bronchopneumonia in the right lower lobe. No new acute findings. 2. Mild cardiomegaly with pulmonary venous congestion, but no frank pulmonary edema. 3. Postoperative changes and support apparatus, as above. 4. Atherosclerosis.   Electronically Signed   By: Trudie Reedaniel  Entrikin M.D.   On: 11/05/2013 21:15    EKG Interpretation    Date/Time:  Thursday November 05 2013 19:41:34 EST Ventricular Rate:  62 PR Interval:  230 QRS Duration: 111 QT  Interval:  458 QTC Calculation: 465 R Axis:   30 Text Interpretation:  Sinus rhythm Prolonged PR interval Anterior infarct, old Nonspecific T abnormalities, lateral leads No significant change since last tracing Confirmed by BEATON  MD, ROBERT ([id number]) on 11/05/2013 8:56:15 PM           MDM   DC today for pneumonia. Here with CP. Unsure is similar to previous episodes of chest pain when he had his CABG. Afebrile. VSS. EKG without acute ischemic changes. Initial troponin negative. BNP less than previous. Improving infiltrate on  his CXR. No evidence of dissection or pneumothorax. No emergent indication for dialysis. The patient was admitted ot medicine for observation and ACS r/o. ASA received prior to arrival. The patient was admitted in HDS condition.    1. Chest pain       Shanon Ace, MD 11/06/13 0010

## 2013-11-05 NOTE — Progress Notes (Signed)
ANTIBIOTIC CONSULT NOTE - FOLLOW UP  Pharmacy Consult for vancomycin, cefepime Indication: HCAP  No Known Allergies  Patient Measurements: Height: 5\' 7"  (170.2 cm) Weight: 313 lb 15 oz (142.4 kg) IBW/kg (Calculated) : 66.1  Vital Signs: Temp: 97.9 F (36.6 C) (01/01 0803) Temp src: Oral (01/01 0803) BP: 141/58 mmHg (01/01 0803) Pulse Rate: 67 (01/01 0803) Intake/Output from previous day: 12/31 0701 - 01/01 0700 In: 480 [P.O.:480] Out: 2557 [Urine:225] Intake/Output from this shift:    Labs:  Recent Labs  11/04/13 0414  WBC 14.1*  HGB 10.0*  PLT 294  CREATININE 3.42*   Estimated Creatinine Clearance: 32.6 ml/min (by C-G formula based on Cr of 3.42). No results found for this basename: Rolm GalaVANCOTROUGH, VANCOPEAK, VANCORANDOM, GENTTROUGH, GENTPEAK, GENTRANDOM, TOBRATROUGH, TOBRAPEAK, TOBRARND, AMIKACINPEAK, AMIKACINTROU, AMIKACIN,  in the last 72 hours    Assessment: 58 yo M admitted on 11/03/2013 for cough and SOB to start broad spectrum antibiotics for suspected HCAP. Pharmacy consulted to dose vancomycin and renally adjust other antibiotics. Patient is ESRD and usual HD schedule is TTS and currently is on holiday schedule with next HD for 1/3 (last HD 12/31).  Cefepime 12/30 >> Vanc 12/30 >>  12/30 BCx x 2 >> sent  Goal of Therapy:  Target pre-HD level 15-1625mcg/mL  Target post-HD level 5-3715mcg/mL   Plan:  -Vancomycin 1000mg  with HD on Saturday -Change cefepime to 2gm with HD on Sat with plan for 2gm IV with HD -Will follow renal function, cultures and clinical progress -Will consider vancomycin trough early next week  Harland GermanAndrew Azusena Erlandson, Pharm D 11/05/2013 9:32 AM

## 2013-11-05 NOTE — ED Notes (Signed)
Pt reports taking one of his 20 mg oxycodone tablets at bedside

## 2013-11-05 NOTE — H&P (Signed)
[facility] History and Physical  Patient: Timothy Banks  ZOX:[id number]  DOB: [birth year]  DOS: the patient was seen and examined on 11/05/2013 PCP: Anselmo Pickler, MD  Chief Complaint: Chest pain  HPI: [name] is a 58 y.o. male with Past medical history of CAD, diabetes, CKD, COPD, hypertension. The patient is coming from home. The patient was recently discharged from the hospital today. Her yesterday while in the hospital did complain about chest pain that initiated from the back and radiated to the front in epigastric region and felt like a sharp pain. The pain resolved on its own and his EKG was obtained which did not show any acute ischemia. His troponin levels were negative and he was discharged home. After reaching home he continues to have a similar chest pain. The pain lasted for more than 10 minutes. He described his pain as substernal nonradiating pressure-like associated with sharp pain and cough. Pt denies any fever, chills, headache, palpitation, shortness of breath, orthopnea, PND, nausea, vomiting, abdominal pain, diarrhea, constipation, active bleeding, burning urination, dizziness, pedal edema,  focal neurological deficit. He mentions the pain occurs on exertion and resolves on rest.  Review of Systems: as mentioned in the history of present illness.  A Comprehensive review of the other systems is negative.  Past Medical History  Diagnosis Date  . Myocardial infarction   . Diabetes mellitus without complication   . CHF (congestive heart failure)   . Asthma   . Hyperlipidemia   . Hypertension   . Chronic kidney disease 02/17/2013    ACUTE RENAL  . Coronary artery disease   . Anginal pain   . Neuromuscular disorder     neuropathy in hands and feet   Past Surgical History  Procedure Laterality Date  . Right carotid endarterectomy  2002    right side  . Coronary artery bypass graft  2002  . Av fistula placement Right 02/25/2013    Procedure:  ARTERIOVENOUS (AV) FISTULA CREATION;  Surgeon: Sherren Kerns, MD;  Location: [facility] OR;  Service: Vascular;  Laterality: Right;  Right Radiocephalic AVF  . Insertion of dialysis catheter Right 03/12/2013    Procedure: ultrasound guided INSERTION OF DIALYSIS CATHETER right internal jugular using arrow 23cm;  Surgeon: Chuck Hint, MD;  Location: [facility] OR;  Service: Vascular;  Laterality: Right;  . Ligation of arteriovenous  fistula Right 04/27/2013    Procedure: LIGATION OF RIGHT RADIAL CEPHALIC ARTERIOVENOUS  FISTULA;  Surgeon: Fransisco Hertz, MD;  Location: [facility] OR;  Service: Vascular;  Laterality: Right;  . Eye surgery      cataract surgery both eyes  . Removal of a dialysis catheter Right 06/10/2013    Procedure: REMOVAL OF RIGHT INTERNAL JUGULAR TUNNELED DIALYSIS CATHETER;  Surgeon: Larina Earthly, MD;  Location: [facility] OR;  Service: Vascular;  Laterality: Right;  . Insertion of dialysis catheter Left 06/10/2013    Procedure: INSERTION OF LEFT INTERNAL JUGULAR TUNNELED DIALYSIS CATHETER;  Surgeon: Larina Earthly, MD;  Location: [facility] OR;  Service: Vascular;  Laterality: Left;  . Removal of a dialysis catheter Left 07/20/2013    Procedure: REMOVAL OF A DIALYSIS CATHETER;  Surgeon: Fransisco Hertz, MD;  Location: [facility] OR;  Service: Vascular;  Laterality: Left;  . Insertion of dialysis catheter Right 07/20/2013    Procedure: INSERTION OF DIALYSIS CATHETER, Right Internal Jugular;  Surgeon: Fransisco Hertz, MD;  Location: [facility] OR;  Service: Vascular;  Laterality: Right;  . Av fistula placement Left 07/20/2013  Procedure: ARTERIOVENOUS (AV) FISTULA CREATION;  Surgeon: Fransisco HertzBrian L Chen, MD;  [facility];  Service: Vascular;  Laterality: Left;   Social History:  reports that he has been smoking Cigarettes.  He started smoking about 40 years ago. He has a 13 pack-year smoking history. He has never used smokeless tobacco. He reports that he does not drink alcohol or use illicit drugs. Independent for most of his  ADL.  No Known  Allergies  Family History  Problem Relation Age of Onset  . Diabetes Mother   . Heart disease Mother   . Diabetes Father   . Heart disease Father     Prior to Admission medications   Medication Sig Start Date End Date Taking? Authorizing Provider  albuterol (PROVENTIL HFA;VENTOLIN HFA) 108 (90 BASE) MCG/ACT inhaler Inhale 2 puffs into the lungs every 6 (six) hours as needed for wheezing or shortness of breath.   Yes Historical Provider, MD  amLODipine (NORVASC) 10 MG tablet Take 10 mg by mouth daily.   Yes Historical Provider, MD  buPROPion (WELLBUTRIN XL) 300 MG 24 hr tablet Take 300 mg by mouth every morning.   Yes Historical Provider, MD  furosemide (LASIX) 80 MG tablet Take 160 mg by mouth 2 (two) times daily.   Yes Historical Provider, MD  glipiZIDE (GLUCOTROL) 10 MG tablet Take 20 mg by mouth 2 (two) times daily before a meal.   Yes Historical Provider, MD  hydrALAZINE (APRESOLINE) 50 MG tablet Take 50-100 mg by mouth 2 (two) times daily. Takes 100mg  in the morning & 50mg  at night   Yes Historical Provider, MD  insulin glargine (LANTUS) 100 UNIT/ML injection Inject 30 Units into the skin at bedtime.   Yes Historical Provider, MD  ipratropium-albuterol (DUONEB) 0.5-2.5 (3) MG/3ML SOLN Take 3 mLs by nebulization every 4 (four) hours as needed (wheezing). 11/05/13  Yes Christiane Haorinna L Sullivan, MD  isosorbide mononitrate (IMDUR) 30 MG 24 hr tablet Take 30 mg by mouth daily.   Yes Historical Provider, MD  moxifloxacin (AVELOX) 400 MG tablet Take 1 tablet (400 mg total) by mouth daily. 11/05/13  Yes Christiane Haorinna L Sullivan, MD  nitroGLYCERIN (NITROSTAT) 0.4 MG SL tablet Place 0.4 mg under the tongue every 5 (five) minutes as needed for chest pain. x3 doses as needed for chest pain   Yes Historical Provider, MD  Oxycodone HCl 20 MG TABS Take 20 mg by mouth 5 (five) times daily.    Yes Historical Provider, MD  predniSONE (DELTASONE) 10 MG tablet 3 tablets daily for 2 days, then decrease by 1 tablet every 2  days until off 11/05/13  Yes Christiane Haorinna L Sullivan, MD  simvastatin (ZOCOR) 20 MG tablet Take 20 mg by mouth every evening.   Yes Eugenia Pancoastlancy C. Laizure  ALPRAZolam (XANAX) 1 MG tablet Take 2 mg by mouth 3 (three) times a week. *takes on Tuesday, Thursday, and Saturday before he goes to dialysis*    Historical Provider, MD    Physical Exam: Filed Vitals:   11/05/13 2230 11/05/13 2231 11/05/13 2300 11/05/13 2330  BP: 138/56  145/63 142/59  Pulse: 59 59 66 62  Temp:      TempSrc:      Resp:  20 16 12   Height:      Weight:      SpO2: 98% 98% 95% 98%    General: Alert, Awake and Oriented to Time, Place and Person. Appear in moderate distress Eyes: PERRL ENT: Oral Mucosa clear moist. Neck: no JVD Cardiovascular: S1 and  S2 Present, no Murmur, Peripheral Pulses Present Respiratory: Bilateral Air entry equal and Decreased, basal Crackles, expiratory wheezes Abdomen: Bowel Sound Present, Soft and Non tender Skin: no Rash Extremities: Bilateral Pedal edema, no calf tenderness Neurologic: Grossly Unremarkable.  Labs on Admission:  CBC:  Recent Labs Lab 11/04/13 0414 11/05/13 1950  WBC 14.1* 15.8*  NEUTROABS 12.9*  --   HGB 10.0* 9.7*  HCT 31.2* 30.7*  MCV 83.2 81.9  PLT 294 357    CMP     Component Value Date/Time   NA 131* 11/05/2013 1950   K 4.3 11/05/2013 1950   CL 90* 11/05/2013 1950   CO2 26 11/05/2013 1950   GLUCOSE 199* 11/05/2013 1950   BUN 41* 11/05/2013 1950   CREATININE 2.89* 11/05/2013 1950   CALCIUM 8.2* 11/05/2013 1950   PROT 8.5* 11/04/2013 0414   ALBUMIN 2.8* 11/04/2013 0414   AST 29 11/04/2013 0414   ALT 23 11/04/2013 0414   ALKPHOS 74 11/04/2013 0414   BILITOT 0.5 11/04/2013 0414   GFRNONAA 23* 11/05/2013 1950   GFRAA 26* 11/05/2013 1950    No results found for this basename: LIPASE, AMYLASE,  in the last 168 hours No results found for this basename: AMMONIA,  in the last 168 hours  No results found for this basename: CKTOTAL, CKMB, CKMBINDEX, TROPONINI,  in the last 168  hours BNP (last 3 results)  Recent Labs  03/02/13 2031 09/28/13 1525 11/05/13 1932  PROBNP 12262.0* 8143.0* 5830.0*    Radiological Exams on Admission: Dg Chest 2 View  11/05/2013   CLINICAL DATA:  Chest pain and pressure. Shortness of breath. Recently discharged from the hospital with pneumonia.  EXAM: CHEST  2 VIEW  COMPARISON:  Chest x-ray 11/03/2013.  FINDINGS: Lung volumes are normal. Resolving airspace consolidation in the right lower lobe, compatible with resolving bronchopneumonia. No new acute consolidative airspace disease. No pleural effusions. Cephalization of the pulmonary vasculature, without frank pulmonary edema. Mild cardiomegaly is unchanged. Upper mediastinal contours are within normal limits. Atherosclerosis in the thoracic aorta. Status post median sternotomy for CABG. Right internal jugular PermCath with tip terminating at the superior cavoatrial junction.  IMPRESSION: 1. Resolving bronchopneumonia in the right lower lobe. No new acute findings. 2. Mild cardiomegaly with pulmonary venous congestion, but no frank pulmonary edema. 3. Postoperative changes and support apparatus, as above. 4. Atherosclerosis.   Electronically Signed   By: Trudie Reed M.D.   On: 11/05/2013 21:15    EKG: Independently reviewed. normal sinus rhythm, nonspecific ST and T waves changes.  Assessment/Plan Principal Problem:   Chest pain Active Problems:   Type II or unspecified type diabetes mellitus with unspecified complication, uncontrolled   Morbid obesity   ESRD on dialysis   CAP (community acquired pneumonia)   COPD exacerbation   1. Chest pain The patient is presenting with complaints of chest pain. The pain is primarily associated with exertion and has been occasionally pressure-like since sensation. His EKG does not show any acute abnormality and his troponin levels are negative. He denies any recent stress test or any coronary workup. Admitted for further evaluation he will be  kept n.p.o. and serial telemetry and troponins will be monitored. He'll be given to 325 mg aspirin. Echocardiogram will also be obtained in the morning  2. Recent admission for community-acquired pneumonia and COPD exacerbation Continue prednisone nebulizers as needed and antibiotics  3. ESRD on dialysis Does not appear overtly volume overloaded continue to monitor I.'s and O.'s and daily weight  4.  Diabetes mellitus Sliding scale at home insulin  DVT Prophylaxis: subcutaneous Heparin Nutrition: Cardiac and diabetic diet, n.p.o. after midnight  Code Status: Full  Family Communication: Family was present at bedside, opportunity was given to ask question and all questions were answered satisfactorily at the time of interview. Disposition: Admitted to observation in telemetry unit.  Author: Lynden Oxford, MD [facility] Pager: [phone] 11/05/2013, 11:52 PM    If 7PM-7AM, please contact night-coverage [email or web address] Password [email or web address]

## 2013-11-06 ENCOUNTER — Encounter (HOSPITAL_COMMUNITY): Payer: Self-pay | Admitting: Surgery

## 2013-11-06 ENCOUNTER — Other Ambulatory Visit: Payer: Self-pay | Admitting: Cardiology

## 2013-11-06 DIAGNOSIS — Z9889 Other specified postprocedural states: Secondary | ICD-10-CM

## 2013-11-06 DIAGNOSIS — N179 Acute kidney failure, unspecified: Secondary | ICD-10-CM

## 2013-11-06 DIAGNOSIS — Z951 Presence of aortocoronary bypass graft: Secondary | ICD-10-CM

## 2013-11-06 DIAGNOSIS — Z9119 Patient's noncompliance with other medical treatment and regimen: Secondary | ICD-10-CM

## 2013-11-06 DIAGNOSIS — N186 End stage renal disease: Secondary | ICD-10-CM

## 2013-11-06 DIAGNOSIS — Z91199 Patient's noncompliance with other medical treatment and regimen due to unspecified reason: Secondary | ICD-10-CM

## 2013-11-06 DIAGNOSIS — I517 Cardiomegaly: Secondary | ICD-10-CM

## 2013-11-06 DIAGNOSIS — R079 Chest pain, unspecified: Secondary | ICD-10-CM

## 2013-11-06 LAB — GLUCOSE, CAPILLARY
Glucose-Capillary: 212 mg/dL — ABNORMAL HIGH (ref 70–99)
Glucose-Capillary: 139 mg/dL — ABNORMAL HIGH (ref 70–99)
Glucose-Capillary: 92 mg/dL (ref 70–99)
Glucose-Capillary: 270 mg/dL — ABNORMAL HIGH (ref 70–99)

## 2013-11-06 LAB — COMPREHENSIVE METABOLIC PANEL
ALBUMIN: 3.2 g/dL — AB (ref 3.5–5.2)
ALK PHOS: 80 U/L (ref 39–117)
ALT: 22 U/L (ref 0–53)
AST: 21 U/L (ref 0–37)
BILIRUBIN TOTAL: 0.3 mg/dL (ref 0.3–1.2)
BUN: 47 mg/dL — ABNORMAL HIGH (ref 6–23)
CHLORIDE: 91 meq/L — AB (ref 96–112)
CO2: 24 meq/L (ref 19–32)
Calcium: 8.3 mg/dL — ABNORMAL LOW (ref 8.4–10.5)
Creatinine, Ser: 3.22 mg/dL — ABNORMAL HIGH (ref 0.50–1.35)
GFR calc Af Amer: 23 mL/min — ABNORMAL LOW (ref >90)
GFR, EST NON AFRICAN AMERICAN: 20 mL/min — AB (ref >90)
Glucose, Bld: 78 mg/dL (ref 70–99)
POTASSIUM: 4 meq/L (ref 3.7–5.3)
SODIUM: 134 meq/L — AB (ref 137–147)
Total Protein: 8.8 g/dL — ABNORMAL HIGH (ref 6.0–8.3)

## 2013-11-06 LAB — CBC WITH DIFFERENTIAL/PLATELET
BASOS ABS: 0 10*3/uL (ref 0.0–0.1)
BASOS PCT: 0 % (ref 0–1)
Eosinophils Absolute: 0.1 10*3/uL (ref 0.0–0.7)
Eosinophils Relative: 1 % (ref 0–5)
HEMATOCRIT: 33.8 % — AB (ref 39.0–52.0)
Hemoglobin: 10.5 g/dL — ABNORMAL LOW (ref 13.0–17.0)
Lymphocytes Relative: 9 % — ABNORMAL LOW (ref 12–46)
Lymphs Abs: 1.6 10*3/uL (ref 0.7–4.0)
MCH: 25.7 pg — ABNORMAL LOW (ref 26.0–34.0)
MCHC: 31.1 g/dL (ref 30.0–36.0)
MCV: 82.8 fL (ref 78.0–100.0)
Monocytes Absolute: 1.2 10*3/uL — ABNORMAL HIGH (ref 0.1–1.0)
Monocytes Relative: 7 % (ref 3–12)
NEUTROS PCT: 83 % — AB (ref 43–77)
Neutro Abs: 14.5 10*3/uL — ABNORMAL HIGH (ref 1.7–7.7)
Platelets: 388 10*3/uL (ref 150–400)
RBC: 4.08 MIL/uL — ABNORMAL LOW (ref 4.22–5.81)
RDW: 15.5 % (ref 11.5–15.5)
WBC: 17.5 10*3/uL — AB (ref 4.0–10.5)

## 2013-11-06 LAB — TROPONIN I: Troponin I: <0.3 ng/mL (ref <0.30)

## 2013-11-06 LAB — LEGIONELLA ANTIGEN, URINE: Legionella Antigen, Urine: NEGATIVE

## 2013-11-06 MED ORDER — FUROSEMIDE 80 MG PO TABS
160.0000 mg | ORAL_TABLET | Freq: Two times a day (BID) | ORAL | Status: DC
  Administered 2013-11-06: 10:00:00 160 mg via ORAL
  Filled 2013-11-06 (×3): qty 2

## 2013-11-06 MED ORDER — MOXIFLOXACIN HCL 400 MG PO TABS
400.0000 mg | ORAL_TABLET | Freq: Every day | ORAL | Status: DC
  Administered 2013-11-06: 400 mg via ORAL
  Filled 2013-11-06 (×2): qty 1

## 2013-11-06 MED ORDER — HYDRALAZINE HCL 50 MG PO TABS
50.0000 mg | ORAL_TABLET | Freq: Every day | ORAL | Status: DC
  Administered 2013-11-06: 50 mg via ORAL
  Filled 2013-11-06 (×2): qty 1

## 2013-11-06 MED ORDER — ALPRAZOLAM 0.5 MG PO TABS: 2.0000 mg | ORAL_TABLET | ORAL | Status: DC

## 2013-11-06 MED ORDER — BUPROPION HCL ER (XL) 300 MG PO TB24
300.0000 mg | ORAL_TABLET | Freq: Every morning | ORAL | Status: DC
  Administered 2013-11-06: 10:00:00 300 mg via ORAL
  Filled 2013-11-06: qty 1

## 2013-11-06 MED ORDER — HEPARIN SODIUM (PORCINE) 5000 UNIT/ML IJ SOLN
5000.0000 [IU] | Freq: Three times a day (TID) | INTRAMUSCULAR | Status: DC
  Administered 2013-11-06: 14:00:00 5000 [IU] via SUBCUTANEOUS
  Filled 2013-11-06 (×4): qty 1

## 2013-11-06 MED ORDER — OXYCODONE HCL 5 MG PO TABS
20.0000 mg | ORAL_TABLET | Freq: Every day | ORAL | Status: DC
  Administered 2013-11-06 (×4): 20 mg via ORAL
  Filled 2013-11-06 (×4): qty 4

## 2013-11-06 MED ORDER — NITROGLYCERIN 0.4 MG SL SUBL: 0.4000 mg | SUBLINGUAL_TABLET | SUBLINGUAL | Status: DC | PRN

## 2013-11-06 MED ORDER — SIMVASTATIN 20 MG PO TABS
20.0000 mg | ORAL_TABLET | Freq: Every evening | ORAL | Status: DC
  Filled 2013-11-06: qty 1

## 2013-11-06 MED ORDER — LEVOFLOXACIN 750 MG PO TABS: 750.0000 mg | ORAL_TABLET | Freq: Every day | ORAL | Status: DC

## 2013-11-06 MED ORDER — HYDRALAZINE HCL 50 MG PO TABS: 50.0000 mg | ORAL_TABLET | Freq: Two times a day (BID) | ORAL | Status: DC

## 2013-11-06 MED ORDER — AMLODIPINE BESYLATE 10 MG PO TABS
10.0000 mg | ORAL_TABLET | Freq: Every day | ORAL | Status: DC
  Administered 2013-11-06: 10 mg via ORAL
  Filled 2013-11-06: qty 1

## 2013-11-06 MED ORDER — ACETAMINOPHEN 325 MG PO TABS: 650.0000 mg | ORAL_TABLET | ORAL | Status: DC | PRN

## 2013-11-06 MED ORDER — IPRATROPIUM-ALBUTEROL 0.5-2.5 (3) MG/3ML IN SOLN: 3.0000 mL | RESPIRATORY_TRACT | Status: DC | PRN

## 2013-11-06 MED ORDER — ONDANSETRON HCL 4 MG/2ML IJ SOLN: 4.0000 mg | Freq: Four times a day (QID) | INTRAMUSCULAR | Status: DC | PRN

## 2013-11-06 MED ORDER — PREDNISONE 20 MG PO TABS
40.0000 mg | ORAL_TABLET | Freq: Every day | ORAL | Status: DC
  Administered 2013-11-06: 40 mg via ORAL
  Filled 2013-11-06 (×2): qty 2

## 2013-11-06 MED ORDER — INSULIN ASPART 100 UNIT/ML ~~LOC~~ SOLN
0.0000 [IU] | Freq: Three times a day (TID) | SUBCUTANEOUS | Status: DC
  Administered 2013-11-06: 17:00:00 8 [IU] via SUBCUTANEOUS
  Administered 2013-11-06: 2 [IU] via SUBCUTANEOUS

## 2013-11-06 MED ORDER — INSULIN ASPART 100 UNIT/ML ~~LOC~~ SOLN
0.0000 [IU] | Freq: Every day | SUBCUTANEOUS | Status: DC
  Administered 2013-11-06: 2 [IU] via SUBCUTANEOUS

## 2013-11-06 MED ORDER — ASPIRIN EC 325 MG PO TBEC
325.0000 mg | DELAYED_RELEASE_TABLET | Freq: Every day | ORAL | Status: DC
  Administered 2013-11-06: 10:00:00 325 mg via ORAL
  Filled 2013-11-06: qty 1

## 2013-11-06 MED ORDER — MOXIFLOXACIN HCL IN NACL 400 MG/250ML IV SOLN
400.0000 mg | Freq: Every day | INTRAVENOUS | Status: DC
  Filled 2013-11-06: qty 250

## 2013-11-06 MED ORDER — GLIPIZIDE 10 MG PO TABS
20.0000 mg | ORAL_TABLET | Freq: Two times a day (BID) | ORAL | Status: DC
  Administered 2013-11-06 (×2): 20 mg via ORAL
  Filled 2013-11-06 (×3): qty 2

## 2013-11-06 MED ORDER — ALBUTEROL SULFATE HFA 108 (90 BASE) MCG/ACT IN AERS: 2.0000 | INHALATION_SPRAY | Freq: Four times a day (QID) | RESPIRATORY_TRACT | Status: DC | PRN

## 2013-11-06 MED ORDER — ISOSORBIDE MONONITRATE ER 30 MG PO TB24
30.0000 mg | ORAL_TABLET | Freq: Every day | ORAL | Status: DC
  Administered 2013-11-06: 10:00:00 30 mg via ORAL
  Filled 2013-11-06: qty 1

## 2013-11-06 MED ORDER — INSULIN GLARGINE 100 UNIT/ML ~~LOC~~ SOLN
30.0000 [IU] | Freq: Every day | SUBCUTANEOUS | Status: DC
  Administered 2013-11-06: 02:00:00 30 [IU] via SUBCUTANEOUS
  Filled 2013-11-06 (×2): qty 0.3

## 2013-11-06 MED ORDER — HYDRALAZINE HCL 50 MG PO TABS
100.0000 mg | ORAL_TABLET | Freq: Every day | ORAL | Status: DC
  Administered 2013-11-06: 100 mg via ORAL
  Filled 2013-11-06: qty 2

## 2013-11-06 NOTE — Progress Notes (Signed)
  Echocardiogram 2D Echocardiogram has been performed.  Timothy Banks 11/06/2013, 1:45 PM

## 2013-11-06 NOTE — Consult Note (Addendum)
Pt. Seen and examined. Agree with the NP/PA-C note as written.  Pleasant 58 yo male, who unfortunately underwent CABG x 4 by Dr. Dorris FetchHendrickson 11 years ago (LIMA-LAD, SVG-first diagonal, SVG-PDA, and SVG-ramus intermedius).   He was seen by Dr. Eden EmmsNishan at that time. He subsequently transferred cardiac care to [facility], but was apparently dismissed for missing appointments. He also has uncontrolled DM2 and recent ESRD on HD M,W,F - he had a fistula placed which will be used next week. He was admitted for atypical lower back pain, much different than before bypass - symptoms resolved very quickly and was found to have PNA.  He ruled-out for MI. Echo today shows preserved LVEF, but Stage 2 diastolic dysfunction and high filling pressures. He is [place] for [place] tomorrow in [place].  I agree that an outpatient NST is a reasonable evaluation of his chest pain, given his old bypass grafts. He has medicare/medicaid and does not mind making the trip [place] to [place] once or twice a year for follow-up.  We will arrange outpatient stress test and follow-up with me thereafter.  Thanks for the consult.  Chrystie NoseKenneth C. Loucile Posner, MD, [facility] Attending Cardiologist [facility]

## 2013-11-06 NOTE — ED Notes (Signed)
Transporting patient to new room assignment. 

## 2013-11-06 NOTE — ED Provider Notes (Addendum)
I saw and evaluated the patient, reviewed the resident's note and I agree with the findings and plan.   .Face to face Exam:  General:  Awake HEENT:  Atraumatic Resp:  Normal effort Abd:  Nondistended Neuro:No focal weakness  I reviewed and interpreted the EKG with resident.    Nelia Shiobert L Cordelro Gautreau, MD 11/26/13 [phone]

## 2013-11-06 NOTE — Progress Notes (Addendum)
[facility] PROGRESS NOTE  Timothy Banks ZOX:[id number] DOB: 30-Jul-1956 DOA: 11/05/2013 PCP: Anselmo Pickler, MD  Assessment/Plan: 58 y.o. male with Past medical history of CAD, diabetes, CKD, COPD, hypertension. The patient is coming from home.  The patient was recently discharged from the hospital today. Her yesterday while in the hospital did complain about chest pain that initiated from the back and radiated to the front in epigastric region and felt like a sharp pain.  1. Chest pain atypical but significant risk factors, CAD CABG, DM ESRD; pend echo -ecg, trop initial neg; c/s cardiology may need stress test    2. Recent admission for community-acquired pneumonia and COPD exacerbation  -Continue prednisone nebulizers as needed and antibiotics   3. ESRD on dialysis; T,T,S Does not appear overtly volume overloaded continue to monitor I.'s and O.'s and daily weight   4. Diabetes mellitus  Sliding scale at home insulin   Code Status: full Family Communication: d/w patient  (indicate person spoken with, relationship, and if by phone, the number) Disposition Plan: home 24-48 hours    Consultants:  Cardiology   Procedures:  None   Antibiotics:  Quinolones to cont  (indicate start date, and stop date if known)  HPI/Subjective: alert  Objective: Filed Vitals:   11/06/13 0953  BP: 128/75  Pulse: 65  Temp:   Resp: 20    Intake/Output Summary (Last 24 hours) at 11/06/13 1223 Last data filed at 11/06/13 1100  Gross per 24 hour  Intake      0 ml  Output    325 ml  Net   -325 ml   Filed Weights   11/05/13 1931 11/06/13 0049  Weight: 143 kg (315 lb 4.1 oz) 144.788 kg (319 lb 3.2 oz)    Exam:   General:  alert  Cardiovascular: s1,s2 rrr  Respiratory: CTA Bl  Abdomen: soft, nt, nd   Musculoskeletal: no LE edema   Data Reviewed: Basic Metabolic Panel:  Recent Labs Lab 11/04/13 0414 11/05/13 1950 11/06/13 0725  NA 129* 131* 134*  K 4.4  4.3 4.0  CL 90* 90* 91*  CO2 23 26 24   GLUCOSE 169* 199* 78  BUN 46* 41* 47*  CREATININE 3.42* 2.89* 3.22*  CALCIUM 8.1* 8.2* 8.3*   Liver Function Tests:  Recent Labs Lab 11/04/13 0414 11/06/13 0725  AST 29 21  ALT 23 22  ALKPHOS 74 80  BILITOT 0.5 0.3  PROT 8.5* 8.8*  ALBUMIN 2.8* 3.2*   No results found for this basename: LIPASE, AMYLASE,  in the last 168 hours No results found for this basename: AMMONIA,  in the last 168 hours CBC:  Recent Labs Lab 11/04/13 0414 11/05/13 1950 11/06/13 0725  WBC 14.1* 15.8* 17.5*  NEUTROABS 12.9*  --  14.5*  HGB 10.0* 9.7* 10.5*  HCT 31.2* 30.7* 33.8*  MCV 83.2 81.9 82.8  PLT 294 357 388   Cardiac Enzymes:  Recent Labs Lab 11/05/13 0128 11/06/13 0449  TROPONINI <0.30 <0.30   BNP (last 3 results)  Recent Labs  03/02/13 2031 09/28/13 1525 11/05/13 1932  PROBNP 12262.0* 8143.0* 5830.0*   CBG:  Recent Labs Lab 11/05/13 0801 11/05/13 1235 11/06/13 0204 11/06/13 0534 11/06/13 1111  GLUCAP 272* 279* 212* 139* 92    Recent Results (from the past 240 hour(s))  MRSA PCR SCREENING     Status: None   Collection Time    11/03/13  9:50 PM      Result Value Range Status   MRSA  by PCR NEGATIVE  NEGATIVE Final   Comment:            The GeneXpert MRSA Assay (FDA     approved for NASAL specimens     only), is one component of a     comprehensive MRSA colonization     surveillance program. It is not     intended to diagnose MRSA     infection nor to guide or     monitor treatment for     MRSA infections.  CULTURE, BLOOD (ROUTINE X 2)     Status: None   Collection Time    11/03/13 10:57 PM      Result Value Range Status   Specimen Description BLOOD RIGHT HAND   Final   Special Requests BOTTLES DRAWN AEROBIC ONLY 10CC   Final   Culture  Setup Time     Final   Value: 11/04/2013 05:27     Performed at [facility]   Culture     Final   Value:        BLOOD CULTURE RECEIVED NO GROWTH TO DATE CULTURE WILL BE  HELD FOR 5 DAYS BEFORE ISSUING A FINAL NEGATIVE REPORT     Performed at [facility]   Report Status PENDING   Incomplete  CULTURE, BLOOD (ROUTINE X 2)     Status: None   Collection Time    11/03/13 11:05 PM      Result Value Range Status   Specimen Description BLOOD RIGHT FOREARM   Final   Special Requests BOTTLES DRAWN AEROBIC ONLY 3CC   Final   Culture  Setup Time     Final   Value: 11/04/2013 05:28     Performed at [facility]   Culture     Final   Value:        BLOOD CULTURE RECEIVED NO GROWTH TO DATE CULTURE WILL BE HELD FOR 5 DAYS BEFORE ISSUING A FINAL NEGATIVE REPORT     Performed at [facility]   Report Status PENDING   Incomplete     Studies: Dg Chest 2 View  11/05/2013   CLINICAL DATA:  Chest pain and pressure. Shortness of breath. Recently discharged from the hospital with pneumonia.  EXAM: CHEST  2 VIEW  COMPARISON:  Chest x-ray 11/03/2013.  FINDINGS: Lung volumes are normal. Resolving airspace consolidation in the right lower lobe, compatible with resolving bronchopneumonia. No new acute consolidative airspace disease. No pleural effusions. Cephalization of the pulmonary vasculature, without frank pulmonary edema. Mild cardiomegaly is unchanged. Upper mediastinal contours are within normal limits. Atherosclerosis in the thoracic aorta. Status post median sternotomy for CABG. Right internal jugular PermCath with tip terminating at the superior cavoatrial junction.  IMPRESSION: 1. Resolving bronchopneumonia in the right lower lobe. No new acute findings. 2. Mild cardiomegaly with pulmonary venous congestion, but no frank pulmonary edema. 3. Postoperative changes and support apparatus, as above. 4. Atherosclerosis.   Electronically Signed   By: Trudie Reed M.D.   On: 11/05/2013 21:15    Scheduled Meds: . [START ON 11/07/2013] ALPRAZolam  2 mg Oral Q T,Th,Sa-HD  . amLODipine  10 mg Oral Daily  . aspirin EC  325 mg Oral Daily  . buPROPion  300 mg Oral  q morning - 10a  . furosemide  160 mg Oral BID  . glipiZIDE  20 mg Oral BID AC  . heparin  5,000 Units Subcutaneous Q8H  . hydrALAZINE  100 mg Oral Daily  And  . hydrALAZINE  50 mg Oral QHS  . insulin aspart  0-15 Units Subcutaneous TID WC  . insulin aspart  0-5 Units Subcutaneous QHS  . insulin glargine  30 Units Subcutaneous QHS  . isosorbide mononitrate  30 mg Oral Daily  . moxifloxacin  400 mg Oral Q2000  . oxyCODONE  20 mg Oral 5 X Daily  . predniSONE  40 mg Oral Q breakfast  . simvastatin  20 mg Oral QPM   Continuous Infusions:   Principal Problem:   Chest pain Active Problems:   Type II or unspecified type diabetes mellitus with unspecified complication, uncontrolled   Morbid obesity   ESRD on dialysis   CAP (community acquired pneumonia)   COPD exacerbation    Time spent: >35 minutes     Esperanza SheetsBURIEV, Timothy Banks  [facility] Pager [phone]. If 7PM-7AM, please contact night-coverage at [email or web address], password [email or web address] 11/06/2013, 12:23 PM  LOS: 1 day

## 2013-11-06 NOTE — Care Management Note (Signed)
    Page 1 of 1   11/06/2013     4:51:46 PM   CARE MANAGEMENT NOTE 11/06/2013  Patient:  Debroah LoopUGH,Cyncere P   Account Number:  [id number]  Date Initiated:  11/06/2013  Documentation initiated by:  Donato SchultzHUTCHINSON,CRYSTAL  Subjective/Objective Assessment:   Admitted with CP     Action/Plan:   Anticipated DC Date:  11/07/2013   Anticipated DC Plan:  HOME/SELF CARE         Choice offered to / List presented to:             Status of service:  Completed, signed off Medicare Important Message given?   (If response is "NO", the following Medicare IM given date fields will be blank) Date Medicare IM given:   Date Additional Medicare IM given:    Discharge Disposition:    Per UR Regulation:  Reviewed for med. necessity/level of care/duration of stay  If discussed at Long Length of Stay Meetings, dates discussed:    Comments:  Hx/o d/c date 11/05/2012 following 1  day Observation status. Presented back to ER with CP 11/05/2013. OBSERVATION Status. Crystal Hutchinson RN, BSN, [facility], [place] 11/06/2013

## 2013-11-06 NOTE — Discharge Summary (Signed)
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 male with Past medical history of CAD, diabetes, CKD, COPD, hypertension. The patient is coming from home.  The patient was recently discharged from the hospital today. Her yesterday while in the hospital did complain about chest pain that initiated from the back and radiated to the front in epigastric region and felt like a sharp pain.   Hospital Course:  1. Chest pain atypical but significant risk factors, CAD CABG, DM ESRD; pend echo  -ecg, trop initial neg; c/s cardiology who recommended/scheduled OP stress test  2. Recent admission for community-acquired pneumonia and COPD exacerbation  -Continue prednisone nebulizers as needed and antibiotics  3. ESRD on dialysis; T,T,S  Does not appear overtly volume overloaded continue to monitor I.'s and O.'s and daily weight  4. Diabetes mellitus  Sliding scale at home insulin   Procedures:  None  (i.e. Studies not automatically included, echos, thoracentesis, etc; not x-rays)  Consultations:  Cardiology   Discharge Exam: Filed  Vitals:   11/06/13 1432  BP: 138/57  Pulse: 71  Temp: 97.3 F (36.3 C)  Resp: 20    General: alert Cardiovascular: s1,s2 rrr Respiratory: CTA BL  Discharge Instructions  Discharge Orders   Future Appointments Provider Department Dept Phone   11/17/2013 9:00 AM Chrystie NoseKenneth C. Hilty, MD [facility] [phone]   Future Orders Complete By Expires   Diet - low sodium heart healthy  As directed    Increase activity slowly  As directed        Medication List         albuterol 108 (90 BASE) MCG/ACT inhaler  Commonly known as:  PROVENTIL HFA;VENTOLIN HFA  Inhale 2 puffs into the lungs every 6 (six) hours as needed for wheezing or shortness of breath.     ALPRAZolam 1 MG tablet  Commonly known as:  XANAX  Take 2 mg by mouth 3 (three) times a week. *takes on Tuesday, Thursday, and Saturday before he goes to dialysis*     amLODipine 10 MG tablet  Commonly known as:  NORVASC  Take 10 mg by mouth daily.     buPROPion 300 MG 24 hr tablet  Commonly known as:  WELLBUTRIN XL  Take 300 mg by mouth every morning.     furosemide 80 MG tablet  Commonly known as:  LASIX  Take 160 mg by mouth 2 (two) times daily.     glipiZIDE 10 MG tablet  Commonly known as:  GLUCOTROL  Take 20 mg by mouth 2 (two) times daily before a meal.     hydrALAZINE 50 MG tablet  Commonly known as:  APRESOLINE  Take 50-100 mg by mouth 2 (two) times daily. Takes 100mg  in the morning & 50mg  at night     insulin glargine 100 UNIT/ML injection  Commonly known as:  LANTUS  Inject 30 Units into the skin at bedtime.     ipratropium-albuterol 0.5-2.5 (3) MG/3ML Soln  Commonly known as:  DUONEB  Take 3 mLs by nebulization every 4 (four) hours as needed (wheezing).     isosorbide mononitrate 30 MG 24 hr tablet  Commonly known as:  IMDUR  Take 30 mg by mouth daily.     moxifloxacin 400 MG tablet  Commonly known as:  AVELOX  Take 1 tablet (400 mg total) by mouth daily.     nitroGLYCERIN 0.4 MG SL  tablet  Commonly known as:  NITROSTAT  Place 0.4 mg under the tongue every 5 (five) minutes as needed for chest pain. x3 doses as needed for chest pain     Oxycodone HCl 20 MG Tabs  Take 20 mg by mouth 5 (five) times daily.     predniSONE 10 MG tablet  Commonly known as:  DELTASONE  3 tablets daily for 2 days, then decrease by 1 tablet every 2 days until off     simvastatin 20 MG tablet  Commonly known as:  ZOCOR  Take 20 mg by mouth every evening.       No Known Allergies     Follow-up Information   Follow up with Chrystie Nose, MD On 11/17/2013. (9:00 am)    Specialty:  Cardiology   Contact information:   [address] [place] [address] [phone]       Follow up with ACHREJA, Youlanda Mighty, MD In 1 week.   Specialty:  Family Medicine   Contact information:   [address] [place] [address] [phone]        The results of significant diagnostics from this hospitalization (including imaging, microbiology, ancillary and laboratory) are listed below for reference.    Significant Diagnostic Studies: Dg Chest 2 View  11/05/2013   CLINICAL DATA:  Chest pain and pressure. Shortness of breath. Recently discharged from the hospital with pneumonia.  EXAM: CHEST  2 VIEW  COMPARISON:  Chest x-ray 11/03/2013.  FINDINGS: Lung volumes are normal. Resolving airspace consolidation in the right lower lobe, compatible with resolving bronchopneumonia. No new acute consolidative airspace disease. No pleural effusions. Cephalization of the pulmonary vasculature, without frank pulmonary edema. Mild cardiomegaly is unchanged. Upper mediastinal contours are within normal limits. Atherosclerosis in the thoracic aorta. Status post median sternotomy for CABG. Right internal jugular PermCath with tip terminating at the superior cavoatrial junction.  IMPRESSION: 1. Resolving bronchopneumonia in the right lower lobe. No new acute findings. 2. Mild cardiomegaly with pulmonary  venous congestion, but no frank pulmonary edema. 3. Postoperative changes and support apparatus, as above. 4. Atherosclerosis.   Electronically Signed   By: Trudie Reed M.D.   On: 11/05/2013 21:15    Microbiology: Recent Results (from the past 240 hour(s))  MRSA PCR SCREENING     Status: None   Collection Time    11/03/13  9:50 PM      Result Value Range Status   MRSA by PCR NEGATIVE  NEGATIVE Final   Comment:            The GeneXpert MRSA Assay (FDA     approved for NASAL specimens     only), is one component of a  comprehensive MRSA colonization     surveillance program. It is not     intended to diagnose MRSA     infection nor to guide or     monitor treatment for     MRSA infections.  CULTURE, BLOOD (ROUTINE X 2)     Status: None   Collection Time    11/03/13 10:57 PM      Result Value Range Status   Specimen Description BLOOD RIGHT HAND   Final   Special Requests BOTTLES DRAWN AEROBIC ONLY 10CC   Final   Culture  Setup Time     Final   Value: 11/04/2013 05:27     Performed at [facility]   Culture     Final   Value:        BLOOD CULTURE RECEIVED NO GROWTH TO DATE CULTURE WILL BE HELD FOR 5 DAYS BEFORE ISSUING A FINAL NEGATIVE REPORT     Performed at [facility]   Report Status PENDING   Incomplete  CULTURE, BLOOD (ROUTINE X 2)     Status: None   Collection Time    11/03/13 11:05 PM      Result Value Range Status   Specimen Description BLOOD RIGHT FOREARM   Final   Special Requests BOTTLES DRAWN AEROBIC ONLY 3CC   Final   Culture  Setup Time     Final   Value: 11/04/2013 05:28     Performed at [facility]   Culture     Final   Value:        BLOOD CULTURE RECEIVED NO GROWTH TO DATE CULTURE WILL BE HELD FOR 5 DAYS BEFORE ISSUING A FINAL NEGATIVE REPORT     Performed at [facility]   Report Status PENDING   Incomplete     Labs: Basic Metabolic Panel:  Recent Labs Lab 11/04/13 0414 11/05/13 1950 11/06/13 0725  NA 129*  131* 134*  K 4.4 4.3 4.0  CL 90* 90* 91*  CO2 23 26 24   GLUCOSE 169* 199* 78  BUN 46* 41* 47*  CREATININE 3.42* 2.89* 3.22*  CALCIUM 8.1* 8.2* 8.3*   Liver Function Tests:  Recent Labs Lab 11/04/13 0414 11/06/13 0725  AST 29 21  ALT 23 22  ALKPHOS 74 80  BILITOT 0.5 0.3  PROT 8.5* 8.8*  ALBUMIN 2.8* 3.2*   No results found for this basename: LIPASE, AMYLASE,  in the last 168 hours No results found for this basename: AMMONIA,  in the last 168 hours CBC:  Recent Labs Lab 11/04/13 0414 11/05/13 1950 11/06/13 0725  WBC 14.1* 15.8* 17.5*  NEUTROABS 12.9*  --  14.5*  HGB 10.0* 9.7* 10.5*  HCT 31.2* 30.7* 33.8*  MCV 83.2 81.9 82.8  PLT 294 357 388   Cardiac Enzymes:  Recent Labs Lab 11/05/13 0128 11/06/13 0449 11/06/13 1120  TROPONINI <0.30 <0.30 <0.30   BNP: BNP (last 3 results)  Recent Labs  03/02/13 2031 09/28/13 1525 11/05/13 1932  PROBNP 12262.0* 8143.0* 5830.0*   CBG:  Recent Labs Lab 11/05/13 1235 11/06/13 0204 11/06/13 0534 11/06/13 1111 11/06/13 1624  GLUCAP 279* 212* 139* 92 270*       Signed:  Aamani Moose N  [facility] 11/06/2013, 5:43 PM

## 2013-11-06 NOTE — Consult Note (Signed)
Reason for Consult: Chest Pain Referring Physician: TRH   HPI: The patient is a 58 y/o morbidly obese male, with known CAD, s/p CABG x 5, by Dr. Dorris Fetch 11 years ago, HTN, poorly controlled T2DM, ESRD, on HD: T, TH, Sat. tobacco abuse (20+ year history, smokes 1 ppd) as well as a family history of early CAD (mother died from MI at age 44). He also has a history of noncompliance. He was apparently followed by a cardiologist in [place], but was dismissed from the practice as a patient for failing to show up to multiple appointments. He was last seen by a cardiologist nearly 8 years ago and has not established care with another cardiologist since that time.   He presented back to [facility] yesterday with a complaint of chest pain. He was admitted 3 days ago for PNA. He was discharged home on antibiotics yesterday. After arriving home, he devolved a sharp pain in his lower back that radiated up to his upper back, then radiated to the center of his chest. He has never felt pain like this before. It is different from the pain he experienced during his MI, prior to undergoing CABG. His symptoms at that time consisted of severe substernal chest pressure radiating to his left arm. He denies experiencing any such symptoms recently. He also denies any other symptoms yesterday, including SOB, diaphoresis, dizziness, n/v syncope/near syncope. There were no alleviating or worsening factors. Non pleuritic, non exertional. The pain persisted and he called 911. He was given SL NTG by EMS, but this did not improve his pain.   EKG on arrival to the ER showed no significant changes compared to prior studies. Cardiac enzymes have been negative x 3. He is currently CP free. Although he did not experience any anginal type pain yesterday, he states that he does on occasion experience slight chest tightness with some exertion. He has NTG at home and states that he last had to use it 3 months ago.   Past Medical History    Diagnosis Date  . Myocardial infarction   . Diabetes mellitus without complication   . CHF (congestive heart failure)   . Asthma   . Hyperlipidemia   . Hypertension   . Chronic kidney disease 02/17/2013    ACUTE RENAL  . Coronary artery disease   . Anginal pain   . Neuromuscular disorder     neuropathy in hands and feet    Past Surgical History  Procedure Laterality Date  . Right carotid endarterectomy  2002    right side  . Coronary artery bypass graft  2002  . Av fistula placement Right 02/25/2013    Procedure: ARTERIOVENOUS (AV) FISTULA CREATION;  Surgeon: Sherren Kerns, MD;  Location: [facility] OR;  Service: Vascular;  Laterality: Right;  Right Radiocephalic AVF  . Insertion of dialysis catheter Right 03/12/2013    Procedure: ultrasound guided INSERTION OF DIALYSIS CATHETER right internal jugular using arrow 23cm;  Surgeon: Chuck Hint, MD;  Location: [facility] OR;  Service: Vascular;  Laterality: Right;  . Ligation of arteriovenous  fistula Right 04/27/2013    Procedure: LIGATION OF RIGHT RADIAL CEPHALIC ARTERIOVENOUS  FISTULA;  Surgeon: Fransisco Hertz, MD;  Location: [facility] OR;  Service: Vascular;  Laterality: Right;  . Eye surgery      cataract surgery both eyes  . Removal of a dialysis catheter Right 06/10/2013    Procedure: REMOVAL OF RIGHT INTERNAL JUGULAR TUNNELED DIALYSIS CATHETER;  Surgeon: Larina Earthly, MD;  Location: [facility]  OR;  Service: Vascular;  Laterality: Right;  . Insertion of dialysis catheter Left 06/10/2013    Procedure: INSERTION OF LEFT INTERNAL JUGULAR TUNNELED DIALYSIS CATHETER;  Surgeon: Rosetta Posner, MD;  Location: [place];  Service: Vascular;  Laterality: Left;  . Removal of a dialysis catheter Left 07/20/2013    Procedure: REMOVAL OF A DIALYSIS CATHETER;  Surgeon: Conrad Middle River, MD;  Location: [place];  Service: Vascular;  Laterality: Left;  . Insertion of dialysis catheter Right 07/20/2013    Procedure: INSERTION OF DIALYSIS CATHETER, Right Internal Jugular;  Surgeon: Conrad Cache, MD;  Location: [place];  Service: Vascular;  Laterality: Right;  . Av fistula placement Left 07/20/2013    Procedure: ARTERIOVENOUS (AV) FISTULA CREATION;  Surgeon: Conrad Harvel, MD;  Location: [facility] OR;  Service: Vascular;  Laterality: Left;    Family History  Problem Relation Age of Onset  . Diabetes Mother   . Heart disease Mother   . Diabetes Father   . Heart disease Father     Social History:  reports that he has been smoking Cigarettes.  He started smoking about 40 years ago. He has a 13 pack-year smoking history. He has never used smokeless tobacco. He reports that he does not drink alcohol or use illicit drugs.  Allergies: No Known Allergies  Medications:  Prior to Admission medications   Medication Sig Start Date End Date Taking? Authorizing Provider  albuterol (PROVENTIL HFA;VENTOLIN HFA) 108 (90 BASE) MCG/ACT inhaler Inhale 2 puffs into the lungs every 6 (six) hours as needed for wheezing or shortness of breath.   Yes Historical Provider, MD  amLODipine (NORVASC) 10 MG tablet Take 10 mg by mouth daily.   Yes Historical Provider, MD  buPROPion (WELLBUTRIN XL) 300 MG 24 hr tablet Take 300 mg by mouth every morning.   Yes Historical Provider, MD  furosemide (LASIX) 80 MG tablet Take 160 mg by mouth 2 (two) times daily.   Yes Historical Provider, MD  glipiZIDE (GLUCOTROL) 10 MG tablet Take 20 mg by mouth 2 (two) times daily before a meal.   Yes Historical Provider, MD  hydrALAZINE (APRESOLINE) 50 MG tablet Take 50-100 mg by mouth 2 (two) times daily. Takes $RemoveBefo'100mg'erWyxqwSKYq$  in the morning & $RemoveB'50mg'psCPoErG$  at night   Yes Historical Provider, MD  insulin glargine (LANTUS) 100 UNIT/ML injection Inject 30 Units into the skin at bedtime.   Yes Historical Provider, MD  ipratropium-albuterol (DUONEB) 0.5-2.5 (3) MG/3ML SOLN Take 3 mLs by nebulization every 4 (four) hours as needed (wheezing). 11/05/13  Yes Delfina Redwood, MD  isosorbide mononitrate (IMDUR) 30 MG 24 hr tablet Take 30 mg by mouth daily.    Yes Historical Provider, MD  moxifloxacin (AVELOX) 400 MG tablet Take 1 tablet (400 mg total) by mouth daily. 11/05/13  Yes Delfina Redwood, MD  nitroGLYCERIN (NITROSTAT) 0.4 MG SL tablet Place 0.4 mg under the tongue every 5 (five) minutes as needed for chest pain. x3 doses as needed for chest pain   Yes Historical Provider, MD  Oxycodone HCl 20 MG TABS Take 20 mg by mouth 5 (five) times daily.    Yes Historical Provider, MD  predniSONE (DELTASONE) 10 MG tablet 3 tablets daily for 2 days, then decrease by 1 tablet every 2 days until off 11/05/13  Yes Delfina Redwood, MD  simvastatin (ZOCOR) 20 MG tablet Take 20 mg by mouth every evening.   Yes Shelle Iron  ALPRAZolam (XANAX) 1 MG tablet Take 2 mg  by mouth 3 (three) times a week. *takes on Tuesday, Thursday, and Saturday before he goes to dialysis*    Historical Provider, MD     Results for orders placed during the hospital encounter of 11/05/13 (from the past 48 hour(s))  TROPONIN I     Status: None   Collection Time    11/05/13  1:28 AM      Result Value Range   Troponin I <0.30  <0.30 ng/mL   Comment:            Due to the release kinetics of cTnI,     a negative result within the first hours     of the onset of symptoms does not rule out     myocardial infarction with certainty.     If myocardial infarction is still suspected,     repeat the test at appropriate intervals.  PRO B NATRIURETIC PEPTIDE     Status: Abnormal   Collection Time    11/05/13  7:32 PM      Result Value Range   Pro B Natriuretic peptide (BNP) 5830.0 (*) 0 - 125 pg/mL  CBC     Status: Abnormal   Collection Time    11/05/13  7:50 PM      Result Value Range   WBC 15.8 (*) 4.0 - 10.5 K/uL   RBC 3.75 (*) 4.22 - 5.81 MIL/uL   Hemoglobin 9.7 (*) 13.0 - 17.0 g/dL   HCT 30.7 (*) 39.0 - 52.0 %   MCV 81.9  78.0 - 100.0 fL   MCH 25.9 (*) 26.0 - 34.0 pg   MCHC 31.6  30.0 - 36.0 g/dL   RDW 15.4  11.5 - 15.5 %   Platelets 357  150 - 400 K/uL  BASIC  METABOLIC PANEL     Status: Abnormal   Collection Time    11/05/13  7:50 PM      Result Value Range   Sodium 131 (*) 137 - 147 mEq/L   Comment: Please note change in reference range.   Potassium 4.3  3.7 - 5.3 mEq/L   Comment: Please note change in reference range.   Chloride 90 (*) 96 - 112 mEq/L   CO2 26  19 - 32 mEq/L   Glucose, Bld 199 (*) 70 - 99 mg/dL   BUN 41 (*) 6 - 23 mg/dL   Creatinine, Ser 2.89 (*) 0.50 - 1.35 mg/dL   Calcium 8.2 (*) 8.4 - 10.5 mg/dL   GFR calc non Af Amer 23 (*) >90 mL/min   GFR calc Af Amer 26 (*) >90 mL/min   Comment: (NOTE)     The eGFR has been calculated using the CKD EPI equation.     This calculation has not been validated in all clinical situations.     eGFR's persistently <90 mL/min signify possible Chronic Kidney     Disease.  POCT I-STAT TROPONIN I     Status: None   Collection Time    11/05/13  7:59 PM      Result Value Range   Troponin i, poc 0.03  0.00 - 0.08 ng/mL   Comment 3            Comment: Due to the release kinetics of cTnI,     a negative result within the first hours     of the onset of symptoms does not rule out     myocardial infarction with certainty.     If myocardial infarction is still  suspected,     repeat the test at appropriate intervals.  GLUCOSE, CAPILLARY     Status: Abnormal   Collection Time    11/06/13  2:04 AM      Result Value Range   Glucose-Capillary 212 (*) 70 - 99 mg/dL   Comment 1 Notify RN    TROPONIN I     Status: None   Collection Time    11/06/13  4:49 AM      Result Value Range   Troponin I <0.30  <0.30 ng/mL   Comment:            Due to the release kinetics of cTnI,     a negative result within the first hours     of the onset of symptoms does not rule out     myocardial infarction with certainty.     If myocardial infarction is still suspected,     repeat the test at appropriate intervals.  GLUCOSE, CAPILLARY     Status: Abnormal   Collection Time    11/06/13  5:34 AM      Result  Value Range   Glucose-Capillary 139 (*) 70 - 99 mg/dL   Comment 1 Notify RN    CBC WITH DIFFERENTIAL     Status: Abnormal   Collection Time    11/06/13  7:25 AM      Result Value Range   WBC 17.5 (*) 4.0 - 10.5 K/uL   RBC 4.08 (*) 4.22 - 5.81 MIL/uL   Hemoglobin 10.5 (*) 13.0 - 17.0 g/dL   HCT 33.8 (*) 39.0 - 52.0 %   MCV 82.8  78.0 - 100.0 fL   MCH 25.7 (*) 26.0 - 34.0 pg   MCHC 31.1  30.0 - 36.0 g/dL   RDW 15.5  11.5 - 15.5 %   Platelets 388  150 - 400 K/uL   Neutrophils Relative % 83 (*) 43 - 77 %   Neutro Abs 14.5 (*) 1.7 - 7.7 K/uL   Lymphocytes Relative 9 (*) 12 - 46 %   Lymphs Abs 1.6  0.7 - 4.0 K/uL   Monocytes Relative 7  3 - 12 %   Monocytes Absolute 1.2 (*) 0.1 - 1.0 K/uL   Eosinophils Relative 1  0 - 5 %   Eosinophils Absolute 0.1  0.0 - 0.7 K/uL   Basophils Relative 0  0 - 1 %   Basophils Absolute 0.0  0.0 - 0.1 K/uL  COMPREHENSIVE METABOLIC PANEL     Status: Abnormal   Collection Time    11/06/13  7:25 AM      Result Value Range   Sodium 134 (*) 137 - 147 mEq/L   Comment: Please note change in reference range.   Potassium 4.0  3.7 - 5.3 mEq/L   Comment: Please note change in reference range.   Chloride 91 (*) 96 - 112 mEq/L   CO2 24  19 - 32 mEq/L   Glucose, Bld 78  70 - 99 mg/dL   BUN 47 (*) 6 - 23 mg/dL   Creatinine, Ser 3.22 (*) 0.50 - 1.35 mg/dL   Calcium 8.3 (*) 8.4 - 10.5 mg/dL   Total Protein 8.8 (*) 6.0 - 8.3 g/dL   Albumin 3.2 (*) 3.5 - 5.2 g/dL   AST 21  0 - 37 U/L   ALT 22  0 - 53 U/L   Alkaline Phosphatase 80  39 - 117 U/L   Total Bilirubin 0.3  0.3 - 1.2 mg/dL  GFR calc non Af Amer 20 (*) >90 mL/min   GFR calc Af Amer 23 (*) >90 mL/min   Comment: (NOTE)     The eGFR has been calculated using the CKD EPI equation.     This calculation has not been validated in all clinical situations.     eGFR's persistently <90 mL/min signify possible Chronic Kidney     Disease.  GLUCOSE, CAPILLARY     Status: None   Collection Time    11/06/13 11:11  AM      Result Value Range   Glucose-Capillary 92  70 - 99 mg/dL   Comment 1 Notify RN    TROPONIN I     Status: None   Collection Time    11/06/13 11:20 AM      Result Value Range   Troponin I <0.30  <0.30 ng/mL   Comment:            Due to the release kinetics of cTnI,     a negative result within the first hours     of the onset of symptoms does not rule out     myocardial infarction with certainty.     If myocardial infarction is still suspected,     repeat the test at appropriate intervals.    Dg Chest 2 View  11/05/2013   CLINICAL DATA:  Chest pain and pressure. Shortness of breath. Recently discharged from the hospital with pneumonia.  EXAM: CHEST  2 VIEW  COMPARISON:  Chest x-ray 11/03/2013.  FINDINGS: Lung volumes are normal. Resolving airspace consolidation in the right lower lobe, compatible with resolving bronchopneumonia. No new acute consolidative airspace disease. No pleural effusions. Cephalization of the pulmonary vasculature, without frank pulmonary edema. Mild cardiomegaly is unchanged. Upper mediastinal contours are within normal limits. Atherosclerosis in the thoracic aorta. Status post median sternotomy for CABG. Right internal jugular PermCath with tip terminating at the superior cavoatrial junction.  IMPRESSION: 1. Resolving bronchopneumonia in the right lower lobe. No new acute findings. 2. Mild cardiomegaly with pulmonary venous congestion, but no frank pulmonary edema. 3. Postoperative changes and support apparatus, as above. 4. Atherosclerosis.   Electronically Signed   By: Vinnie Langton M.D.   On: 11/05/2013 21:15    Review of Systems  Constitutional: Negative for diaphoresis.  Respiratory: Negative for shortness of breath.   Cardiovascular: Positive for chest pain and leg swelling.  Gastrointestinal: Negative for nausea and vomiting.  Musculoskeletal: Positive for back pain.  Neurological: Negative for dizziness and loss of consciousness.  All other  systems reviewed and are negative.   Blood pressure 128/75, pulse 65, temperature 97.3 F (36.3 C), temperature source Oral, resp. rate 20, height $RemoveBe'5\' 7"'oVepjyVGM$  (1.702 m), weight 144.788 kg (319 lb 3.2 oz), SpO2 96.00%. Physical Exam  Constitutional: He is oriented to person, place, and time. He appears well-developed and well-nourished. No distress.  Neck: No JVD present. Carotid bruit is present (cannot easily assess due to body habitus).  Cardiovascular: Normal rate and regular rhythm.  Exam reveals no gallop and no friction rub.   No murmur heard. Pulses:      Radial pulses are 2+ on the right side, and 2+ on the left side.       Dorsalis pedis pulses are 2+ on the right side, and 2+ on the left side.  Respiratory: Effort normal. No respiratory distress. He has wheezes ( rhonchi). He has no rales. He exhibits no tenderness.  GI: Soft. Bowel sounds are normal. He exhibits  no distension and no mass. There is no tenderness.  Obese   Musculoskeletal: He exhibits edema (trace bilateral LEE).  Neurological: He is alert and oriented to person, place, and time.  Skin: Skin is warm and dry.  Psychiatric: He has a normal mood and affect. His behavior is normal.    Assessment/Plan: Principal Problem:   Chest pain Active Problems:   Type II or unspecified type diabetes mellitus with unspecified complication, uncontrolled   Morbid obesity   ESRD on dialysis   CAP (community acquired pneumonia)   COPD exacerbation  Plan:  58 y/o morbidly obese male with multiple cardiac risk factors, as mentioned above, admitted for chest pain. The characteristics of his chest pain sound more atypical. He has ruled out for MI with negative enzymes x 3. EKG shows some Twave abnormalities in lateral leads. 2D echo pending. Recommend NST. If 2D echo shows no abnormality, then NST may be done as an OP, although if he has had compliance issues making it to scheduled appointments in the past, then it may be worth performing  inpatient. He will need to work on lifestyle modifications to prevent future CV events. I spoke with him on importance of smoking cessation, as well as better DM and weight management. MD to follow.   Haiden Rawlinson 11/06/2013, 1:51 PM

## 2013-11-10 ENCOUNTER — Encounter: Payer: Self-pay | Admitting: *Deleted

## 2013-11-10 ENCOUNTER — Telehealth (HOSPITAL_COMMUNITY): Payer: Self-pay | Admitting: *Deleted

## 2013-11-10 LAB — CULTURE, BLOOD (ROUTINE X 2)
Culture: NO GROWTH
Culture: NO GROWTH

## 2013-11-13 ENCOUNTER — Encounter: Payer: Self-pay | Admitting: Internal Medicine

## 2013-11-17 ENCOUNTER — Ambulatory Visit: Payer: Medicare Other | Admitting: Internal Medicine

## 2013-11-24 ENCOUNTER — Telehealth (HOSPITAL_COMMUNITY): Payer: Self-pay | Admitting: *Deleted

## 2013-12-06 DEATH — deceased

## 2014-05-09 IMAGING — CR DG CHEST 2V
2 series · 2 of 2 positions shown · non-contrast
Comparison: Chest x-ray 11/03/2013.

CLINICAL DATA: Chest pain and pressure. Shortness of breath.
Recently discharged from the hospital with pneumonia.

EXAM:
CHEST  2 VIEW

[w chest pa]
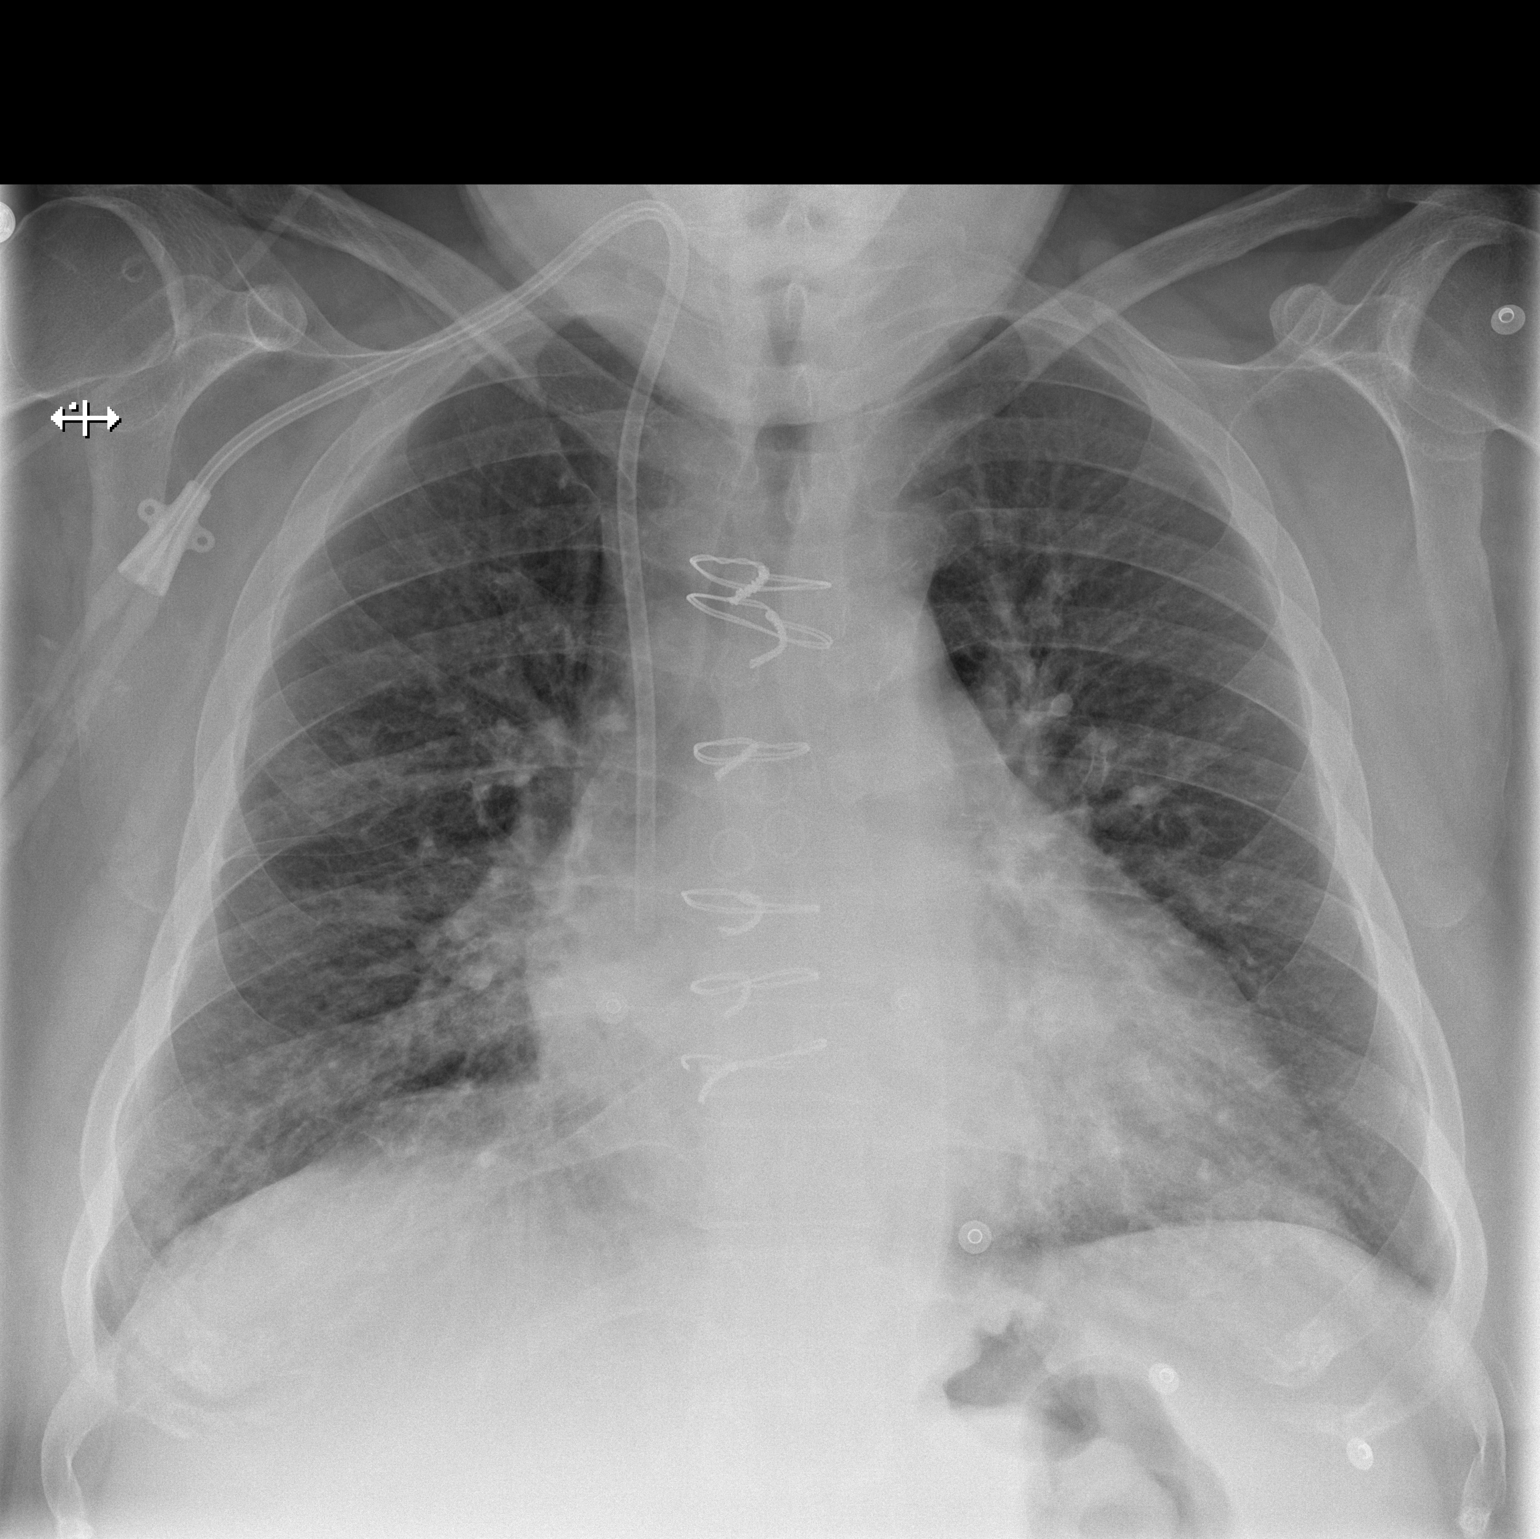

[w chest lat]
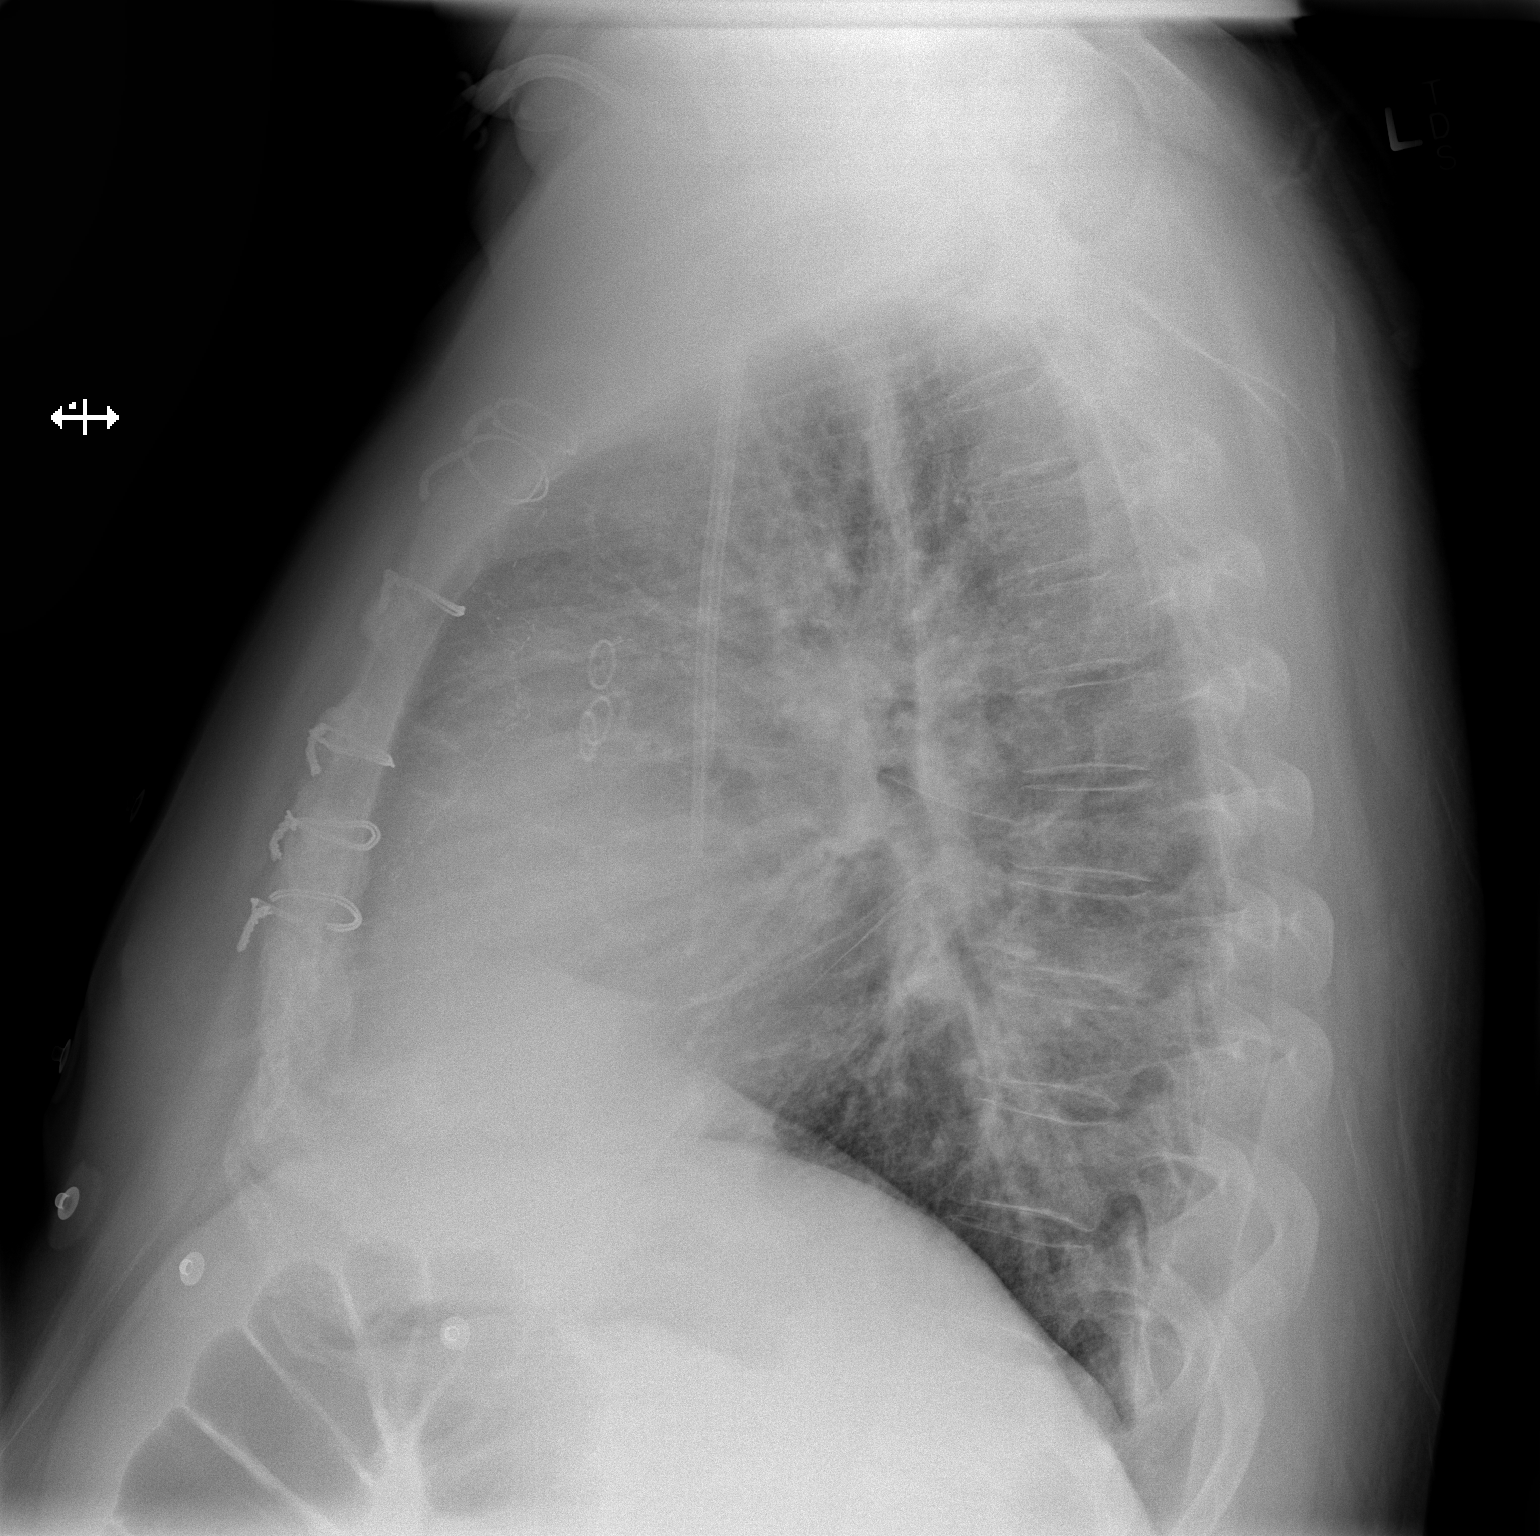

[2 of 2 positions shown; findings below may reference images not displayed]

FINDINGS: Lung volumes are normal. Resolving airspace consolidation in the
right lower lobe, compatible with resolving bronchopneumonia. No new
acute consolidative airspace disease. No pleural effusions.
Cephalization of the pulmonary vasculature, without frank pulmonary
edema. Mild cardiomegaly is unchanged. Upper mediastinal contours
are within normal limits. Atherosclerosis in the thoracic aorta.
Status post median sternotomy for CABG. Right internal jugular
PermCath with tip terminating at the superior cavoatrial junction.
IMPRESSION: 1. Resolving bronchopneumonia in the right lower lobe. No new acute
findings.
2. Mild cardiomegaly with pulmonary venous congestion, but no frank
pulmonary edema.
3. Postoperative changes and support apparatus, as above.
4. Atherosclerosis.

## 2014-10-14 ENCOUNTER — Encounter (HOSPITAL_COMMUNITY): Payer: Self-pay | Admitting: Vascular Surgery
